# Patient Record
Sex: Female | Born: 1945
Health system: Southern US, Community
[De-identification: ages and names within clinical notes are randomized; demographics above are authoritative.]

## PROBLEM LIST (undated history)

## (undated) DIAGNOSIS — L409 Psoriasis, unspecified: Secondary | ICD-10-CM

## (undated) DIAGNOSIS — M791 Myalgia, unspecified site: Secondary | ICD-10-CM

## (undated) DIAGNOSIS — F32A Depression, unspecified: Secondary | ICD-10-CM

## (undated) DIAGNOSIS — I6529 Occlusion and stenosis of unspecified carotid artery: Secondary | ICD-10-CM

## (undated) DIAGNOSIS — N83201 Unspecified ovarian cyst, right side: Secondary | ICD-10-CM

## (undated) DIAGNOSIS — I348 Other nonrheumatic mitral valve disorders: Secondary | ICD-10-CM

## (undated) DIAGNOSIS — H5704 Mydriasis: Secondary | ICD-10-CM

## (undated) DIAGNOSIS — I341 Nonrheumatic mitral (valve) prolapse: Secondary | ICD-10-CM

## (undated) DIAGNOSIS — E559 Vitamin D deficiency, unspecified: Secondary | ICD-10-CM

## (undated) DIAGNOSIS — K219 Gastro-esophageal reflux disease without esophagitis: Secondary | ICD-10-CM

## (undated) DIAGNOSIS — M545 Low back pain, unspecified: Secondary | ICD-10-CM

## (undated) DIAGNOSIS — M1990 Unspecified osteoarthritis, unspecified site: Secondary | ICD-10-CM

## (undated) DIAGNOSIS — E161 Other hypoglycemia: Secondary | ICD-10-CM

## (undated) DIAGNOSIS — F329 Major depressive disorder, single episode, unspecified: Secondary | ICD-10-CM

## (undated) DIAGNOSIS — H8120 Vestibular neuronitis, unspecified ear: Secondary | ICD-10-CM

## (undated) DIAGNOSIS — R203 Hyperesthesia: Secondary | ICD-10-CM

## (undated) DIAGNOSIS — E7800 Pure hypercholesterolemia, unspecified: Secondary | ICD-10-CM

## (undated) DIAGNOSIS — M810 Age-related osteoporosis without current pathological fracture: Secondary | ICD-10-CM

## (undated) DIAGNOSIS — L920 Granuloma annulare: Secondary | ICD-10-CM

## (undated) DIAGNOSIS — Z87440 Personal history of urinary (tract) infections: Secondary | ICD-10-CM

## (undated) DIAGNOSIS — H8109 Meniere's disease, unspecified ear: Secondary | ICD-10-CM

## (undated) DIAGNOSIS — K5790 Diverticulosis of intestine, part unspecified, without perforation or abscess without bleeding: Secondary | ICD-10-CM

## (undated) DIAGNOSIS — M169 Osteoarthritis of hip, unspecified: Secondary | ICD-10-CM

## (undated) DIAGNOSIS — Z8489 Family history of other specified conditions: Secondary | ICD-10-CM

## (undated) DIAGNOSIS — I3489 Other nonrheumatic mitral valve disorders: Secondary | ICD-10-CM

## (undated) DIAGNOSIS — I340 Nonrheumatic mitral (valve) insufficiency: Secondary | ICD-10-CM

## (undated) DIAGNOSIS — L309 Dermatitis, unspecified: Secondary | ICD-10-CM

## (undated) DIAGNOSIS — T7840XA Allergy, unspecified, initial encounter: Secondary | ICD-10-CM

## (undated) DIAGNOSIS — B029 Zoster without complications: Secondary | ICD-10-CM

## (undated) DIAGNOSIS — H269 Unspecified cataract: Secondary | ICD-10-CM

## (undated) DIAGNOSIS — E785 Hyperlipidemia, unspecified: Secondary | ICD-10-CM

## (undated) DIAGNOSIS — H8190 Unspecified disorder of vestibular function, unspecified ear: Secondary | ICD-10-CM

## (undated) DIAGNOSIS — I511 Rupture of chordae tendineae, not elsewhere classified: Secondary | ICD-10-CM

## (undated) DIAGNOSIS — H832X9 Labyrinthine dysfunction, unspecified ear: Secondary | ICD-10-CM

## (undated) DIAGNOSIS — R140 Abdominal distension (gaseous): Secondary | ICD-10-CM

## (undated) DIAGNOSIS — R42 Dizziness and giddiness: Secondary | ICD-10-CM

## (undated) DIAGNOSIS — I2510 Atherosclerotic heart disease of native coronary artery without angina pectoris: Secondary | ICD-10-CM

## (undated) DIAGNOSIS — Z8719 Personal history of other diseases of the digestive system: Secondary | ICD-10-CM

## (undated) HISTORY — DX: Nonrheumatic mitral (valve) insufficiency: I34.0

## (undated) HISTORY — DX: Hyperlipidemia, unspecified: E78.5

## (undated) HISTORY — DX: Osteoarthritis of hip, unspecified: M16.9

## (undated) HISTORY — DX: Dizziness and giddiness: R42

## (undated) HISTORY — PX: TONSILLECTOMY: SUR1361

## (undated) HISTORY — DX: Rupture of chordae tendineae, not elsewhere classified: I51.1

## (undated) HISTORY — DX: Hyperesthesia: R20.3

## (undated) HISTORY — DX: Low back pain: M54.5

## (undated) HISTORY — DX: Unspecified cataract: H26.9

## (undated) HISTORY — DX: Other hypoglycemia: E16.1

## (undated) HISTORY — DX: Major depressive disorder, single episode, unspecified: F32.9

## (undated) HISTORY — DX: Nonrheumatic mitral (valve) prolapse: I34.1

## (undated) HISTORY — DX: Other nonrheumatic mitral valve disorders: I34.8

## (undated) HISTORY — DX: Allergy, unspecified, initial encounter: T78.40XA

## (undated) HISTORY — DX: Myalgia, unspecified site: M79.10

## (undated) HISTORY — DX: Depression, unspecified: F32.A

## (undated) HISTORY — DX: Abdominal distension (gaseous): R14.0

## (undated) HISTORY — DX: Psoriasis, unspecified: L40.9

## (undated) HISTORY — DX: Vitamin D deficiency, unspecified: E55.9

## (undated) HISTORY — DX: Zoster without complications: B02.9

## (undated) HISTORY — DX: Pure hypercholesterolemia, unspecified: E78.00

## (undated) HISTORY — DX: Gastro-esophageal reflux disease without esophagitis: K21.9

## (undated) HISTORY — DX: Diverticulosis of intestine, part unspecified, without perforation or abscess without bleeding: K57.90

## (undated) HISTORY — DX: Vestibular neuronitis, unspecified ear: H81.20

## (undated) HISTORY — DX: Granuloma annulare: L92.0

## (undated) HISTORY — DX: Low back pain, unspecified: M54.50

## (undated) HISTORY — DX: Unspecified ovarian cyst, right side: N83.201

## (undated) HISTORY — DX: Occlusion and stenosis of unspecified carotid artery: I65.29

## (undated) HISTORY — DX: Atherosclerotic heart disease of native coronary artery without angina pectoris: I25.10

## (undated) HISTORY — DX: Unspecified osteoarthritis, unspecified site: M19.90

## (undated) HISTORY — DX: Other nonrheumatic mitral valve disorders: I34.89

---

## 1998-04-02 ENCOUNTER — Other Ambulatory Visit: Admission: RE | Admit: 1998-04-02 | Discharge: 1998-04-02 | Payer: Self-pay | Admitting: Endocrinology

## 1998-04-23 ENCOUNTER — Ambulatory Visit (HOSPITAL_COMMUNITY): Admission: RE | Admit: 1998-04-23 | Discharge: 1998-04-23 | Payer: Self-pay | Admitting: Endocrinology

## 1998-12-20 ENCOUNTER — Encounter: Payer: Self-pay | Admitting: Endocrinology

## 1998-12-20 ENCOUNTER — Ambulatory Visit (HOSPITAL_COMMUNITY): Admission: RE | Admit: 1998-12-20 | Discharge: 1998-12-20 | Payer: Self-pay | Admitting: Endocrinology

## 1999-03-18 ENCOUNTER — Other Ambulatory Visit: Admission: RE | Admit: 1999-03-18 | Discharge: 1999-03-18 | Payer: Self-pay | Admitting: Endocrinology

## 2000-03-19 ENCOUNTER — Encounter: Payer: Self-pay | Admitting: Family Medicine

## 2000-03-19 ENCOUNTER — Ambulatory Visit (HOSPITAL_COMMUNITY): Admission: RE | Admit: 2000-03-19 | Discharge: 2000-03-19 | Payer: Self-pay | Admitting: Family Medicine

## 2001-06-17 ENCOUNTER — Other Ambulatory Visit: Admission: RE | Admit: 2001-06-17 | Discharge: 2001-06-17 | Payer: Self-pay | Admitting: Family Medicine

## 2001-07-19 ENCOUNTER — Ambulatory Visit (HOSPITAL_COMMUNITY): Admission: RE | Admit: 2001-07-19 | Discharge: 2001-07-19 | Payer: Self-pay | Admitting: Family Medicine

## 2001-07-19 ENCOUNTER — Encounter: Payer: Self-pay | Admitting: Family Medicine

## 2003-04-26 ENCOUNTER — Encounter: Admission: RE | Admit: 2003-04-26 | Discharge: 2003-06-15 | Payer: Self-pay | Admitting: Family Medicine

## 2003-05-12 ENCOUNTER — Emergency Department (HOSPITAL_COMMUNITY): Admission: EM | Admit: 2003-05-12 | Discharge: 2003-05-12 | Payer: Self-pay | Admitting: Emergency Medicine

## 2003-06-06 ENCOUNTER — Ambulatory Visit (HOSPITAL_COMMUNITY): Admission: RE | Admit: 2003-06-06 | Discharge: 2003-06-06 | Payer: Self-pay | Admitting: Orthopedic Surgery

## 2003-06-06 ENCOUNTER — Encounter: Payer: Self-pay | Admitting: Orthopedic Surgery

## 2005-04-03 ENCOUNTER — Other Ambulatory Visit: Admission: RE | Admit: 2005-04-03 | Discharge: 2005-04-03 | Payer: Self-pay | Admitting: Family Medicine

## 2005-05-22 ENCOUNTER — Encounter: Admission: RE | Admit: 2005-05-22 | Discharge: 2005-05-22 | Payer: Self-pay | Admitting: Family Medicine

## 2008-08-02 ENCOUNTER — Other Ambulatory Visit: Admission: RE | Admit: 2008-08-02 | Discharge: 2008-08-02 | Payer: Self-pay | Admitting: Obstetrics and Gynecology

## 2008-10-24 ENCOUNTER — Ambulatory Visit (HOSPITAL_COMMUNITY): Admission: RE | Admit: 2008-10-24 | Discharge: 2008-10-24 | Payer: Self-pay | Admitting: Obstetrics and Gynecology

## 2010-08-13 ENCOUNTER — Encounter: Admission: RE | Admit: 2010-08-13 | Discharge: 2010-08-13 | Payer: Self-pay | Admitting: Family Medicine

## 2010-12-25 ENCOUNTER — Other Ambulatory Visit: Payer: Self-pay | Admitting: Nurse Practitioner

## 2010-12-25 ENCOUNTER — Other Ambulatory Visit
Admission: RE | Admit: 2010-12-25 | Discharge: 2010-12-25 | Payer: Self-pay | Source: Home / Self Care | Admitting: Obstetrics and Gynecology

## 2011-09-09 ENCOUNTER — Other Ambulatory Visit: Payer: Self-pay | Admitting: Family Medicine

## 2011-09-09 DIAGNOSIS — R0989 Other specified symptoms and signs involving the circulatory and respiratory systems: Secondary | ICD-10-CM

## 2011-09-10 ENCOUNTER — Ambulatory Visit
Admission: RE | Admit: 2011-09-10 | Discharge: 2011-09-10 | Disposition: A | Discharge status: Home / Self Care | Payer: 59 | Source: Ambulatory Visit | Attending: Family Medicine | Admitting: Family Medicine

## 2011-09-10 DIAGNOSIS — R0989 Other specified symptoms and signs involving the circulatory and respiratory systems: Secondary | ICD-10-CM

## 2011-09-16 ENCOUNTER — Other Ambulatory Visit: Payer: Self-pay

## 2011-09-16 DIAGNOSIS — I6529 Occlusion and stenosis of unspecified carotid artery: Secondary | ICD-10-CM

## 2011-10-14 ENCOUNTER — Encounter: Payer: Self-pay | Admitting: Surgery

## 2011-10-16 ENCOUNTER — Encounter: Payer: Self-pay | Admitting: Surgery

## 2011-10-19 ENCOUNTER — Ambulatory Visit (INDEPENDENT_AMBULATORY_CARE_PROVIDER_SITE_OTHER): Payer: 59 | Admitting: Surgery

## 2011-10-19 ENCOUNTER — Ambulatory Visit (INDEPENDENT_AMBULATORY_CARE_PROVIDER_SITE_OTHER): Payer: 59 | Admitting: Vascular Surgery

## 2011-10-19 ENCOUNTER — Encounter: Payer: Self-pay | Admitting: Surgery

## 2011-10-19 DIAGNOSIS — I6529 Occlusion and stenosis of unspecified carotid artery: Secondary | ICD-10-CM

## 2011-10-19 DIAGNOSIS — R42 Dizziness and giddiness: Secondary | ICD-10-CM

## 2011-10-19 DIAGNOSIS — H9319 Tinnitus, unspecified ear: Secondary | ICD-10-CM

## 2011-10-19 NOTE — Progress Notes (Signed)
[facility] [place]   Patient name: Lori Frost MRN: [id number] DOB: [birth year] Sex: female   Referred by: Dr. Juluis Rainier  Reason for referral:  Chief Complaint  Patient presents with  . Carotid    dizziness after vertigo attacks for last year. The dizziness occurs every day    HISTORY OF PRESENT ILLNESS: The patient comes in today for evaluation of her carotid occlusive disease. She has recently undergone carotid duplex study during a workup for her dizziness. This found less than 50% right carotid stenosis. The patient's difficulties date back to approximately one year ago when she had a vertigo-like episode she's undergone multiple tests and referrals to evaluate this problem. She has been diagnosed with  possible vestibular neuritis. She continues to suffers from balance issues and dizziness. She's had multiple attacks I was in the past 12 months.  The patient denies any specific symptoms related to carotid occlusive disease. Specifically she denies numbness or weakness in either extremity. She denies slurred speech she denies amaurosis fugax  The patient does not have a history of smoking. She does have hypercholesterolemia. She is intolerant of statin medications and is maintained on a maximum dose of WelChol. She also suffers from mitral valve prolapse and migraine headaches. She has a strong family history for premature cardiovascular disease. Past Medical History  Diagnosis Date  . Hyperlipidemia   . Depression   . Myalgia   . DJD (degenerative joint disease)   . Shingles   . Diverticulosis   . Osteopenia   . Vertigo   . Migraine   . Osteopenia   . Cyst of ovary, right   . Vestibular neuronitis     No past surgical history on file.  History   Social History  . Marital Status: Single    Spouse Name: N/A    Number of Children: N/A  . Years of Education: N/A   Occupational History  . Not on file.   Social History Main Topics  .  Smoking status: Never Smoker   . Smokeless tobacco: Not on file  . Alcohol Use: No  . Drug Use: No  . Sexually Active: Not on file   Other Topics Concern  . Not on file   Social History Narrative  . No narrative on file    No family history on file.  Allergies as of 10/19/2011 - Review Complete 10/19/2011  Allergen Reaction Noted  . Codeine Nausea Only 10/14/2011  . Statins  10/14/2011  . Zoloft  10/14/2011    Current Outpatient Prescriptions on File Prior to Visit  Medication Sig Dispense Refill  . Ascorbic Acid (VITAMIN C) 1000 MG tablet Take 1,000 mg by mouth daily.        Marland Kitchen aspirin EC 81 MG tablet Take 81 mg by mouth daily.        . butalbital-acetaminophen-caffeine (FIORICET WITH CODEINE) 50-325-40-30 MG per capsule Take 1 capsule by mouth every 4 (four) hours as needed.        . calcium carbonate (OS-CAL) 600 MG TABS Take 600 mg by mouth daily.        . Cholecalciferol (VITAMIN D3) 1000 UNITS CAPS Take 1 tablet by mouth daily.        . colesevelam (WELCHOL) 625 MG tablet Take 1,875 mg by mouth 2 (two) times daily with a meal.        . cromolyn (NASALCROM) 5.2 MG/ACT nasal spray Place 1 spray into the nose daily.        Marland Kitchen  Glucosamine-Chondroit-Vit C-Mn (GLUCOSAMINE 1500 COMPLEX PO) Take 1 tablet by mouth daily.        Marland Kitchen loratadine (CLARITIN) 10 MG tablet Take 10 mg by mouth daily.        Marland Kitchen LORazepam (ATIVAN) 0.5 MG tablet Take 0.5 mg by mouth every 6 (six) hours as needed.        . meclizine (ANTIVERT) 25 MG tablet Take 25 mg by mouth 2 (two) times daily as needed.        . Multiple Vitamins-Minerals (CENTRUM CARDIO) TABS Take 1 tablet by mouth 2 (two) times daily.        . Omega-3 Fatty Acids (FISH OIL) 1200 MG CAPS Take 1 capsule by mouth daily.           REVIEW OF SYSTEMS: Cardiovascular: Positive for palpitations and a murmur from prolapse Pulmonary: Positive for bronchitis Neurologic: Positive for dizziness and headaches Hematologic: No bleeding problems or  clotting disorders. Musculoskeletal: No joint pain or joint swelling. Positive for muscle pain Gastrointestinal: No blood in stool or hematemesis Genitourinary: Positive for burning with urination and frequent urination occasional UTI Psychiatric:: No history of major depression. Integumentary: Rash on her arms and legs Constitutional: No fever or chills.  PHYSICAL EXAMINATION: General: The patient appears their stated age.  Vital signs are BP 125/80  Pulse 90  Temp(Src) 98 F (36.7 C) (Oral)  Resp 20  Ht 5\' 2"  (1.575 m)  Wt 125 lb (56.7 kg)  BMI 22.86 kg/m2  SpO2 97% Pulmonary: Respirations are non-labored no wheezes Abdomen: Soft and non-tender aorta is nonaneurysmal Musculoskeletal: There are no major deformities.   Neurologic: No focal weakness or paresthesias are detected, Skin: There are no ulcer or rashes noted. Psychiatric: The patient has normal affect. Cardiovascular: There is a regular rate and rhythm without significant murmur appreciated. Pedal pulses are palpable no carotid bruits  Diagnostic Studies: Carotid duplex shows a 40-59% right carotid stenosis left carotid artery is widely patent both vertebral arteries are antegrade   Medication Changes: None  Assessment:  Asymptomatic carotid stenosis Plan: I do not believe that the patient's carotid disease can be treated to any of her active symptoms. I believe she is completely asymptomatic from her carotid stenosis. We did discuss the signs and symptoms of a stroke today so that she can be on the look out if they should occur in no if they are. I believe she should be medically managed at this time which would include antiplatelet therapy (continue her aspirin), blood pressure control and cholesterol management. The patient will require yearly surveillance ultrasound screening of her carotid artery I have scheduled her To be done our office in one year out on seeing her on a every other year basis assuming that she  has not suffered change in symptoms or progression of disease     V. Charlena Cross, M.D. [facility] [place] Office: [phone]

## 2011-10-26 NOTE — Procedures (Unsigned)
CAROTID DUPLEX EXAM  INDICATION:  Follow up carotid stenosis.  HISTORY: Diabetes:  No. Cardiac:  No, mitral valve prolapse. Hypertension:  No. Smoking:  No. Previous Surgery:  No carotid intervention. CV History:  Ringing in ears, vertigo. Amaurosis Fugax No, Paresthesias No, Hemiparesis No.                                      RIGHT             LEFT Brachial systolic pressure:         122               118 Brachial Doppler waveforms:         WNL               WNL Vertebral direction of flow:        Antegrade         Antegrade DUPLEX VELOCITIES (cm/sec) CCA peak systolic                   87                81 ECA peak systolic                   84                79 ICA peak systolic                   88 - P/162 - D    69 ICA end diastolic                   34 - P/59 - D     28 PLAQUE MORPHOLOGY:                  Heterogenous      Heterogenous PLAQUE AMOUNT:                      Mild              Mild PLAQUE LOCATION:                    CCA/ICA           CCA/ICA  IMPRESSION: 1. Right internal carotid artery stenosis present in the 1% to 39%     range with elevated velocities at the distal segment and vessel     tortuosity present, maybe suggesting a 40% to 59% stenosis. 2. Left internal carotid artery stenosis present in the 1% to 39%     range. 3. Patent bilateral external carotid arteries. 4. Patent and antegrade bilateral vertebral arteries.  ___________________________________________ V. Charlena Cross, MD  SH/MEDQ  D:  10/19/2011  T:  10/19/2011  Job:  [id number]

## 2012-10-19 ENCOUNTER — Other Ambulatory Visit: Payer: Self-pay | Admitting: *Deleted

## 2012-10-19 DIAGNOSIS — I6529 Occlusion and stenosis of unspecified carotid artery: Secondary | ICD-10-CM

## 2012-10-24 ENCOUNTER — Ambulatory Visit (INDEPENDENT_AMBULATORY_CARE_PROVIDER_SITE_OTHER): Payer: 59 | Admitting: Vascular Surgery

## 2012-10-24 DIAGNOSIS — I6529 Occlusion and stenosis of unspecified carotid artery: Secondary | ICD-10-CM

## 2012-10-24 NOTE — Progress Notes (Signed)
Carotid duplex performed @ [facility] 10/24/2012

## 2012-10-25 ENCOUNTER — Other Ambulatory Visit: Payer: Self-pay | Admitting: *Deleted

## 2012-10-25 DIAGNOSIS — I6529 Occlusion and stenosis of unspecified carotid artery: Secondary | ICD-10-CM

## 2012-10-25 DIAGNOSIS — I773 Arterial fibromuscular dysplasia: Secondary | ICD-10-CM

## 2012-10-27 ENCOUNTER — Encounter: Payer: Self-pay | Admitting: Surgery

## 2013-05-01 ENCOUNTER — Other Ambulatory Visit: Payer: 59

## 2013-05-01 ENCOUNTER — Ambulatory Visit: Payer: 59 | Admitting: Neurosurgery

## 2013-10-16 ENCOUNTER — Other Ambulatory Visit: Payer: 59

## 2013-10-16 ENCOUNTER — Ambulatory Visit: Payer: 59 | Admitting: Neurosurgery

## 2013-10-23 ENCOUNTER — Ambulatory Visit: Payer: 59 | Admitting: Neurosurgery

## 2013-10-23 ENCOUNTER — Other Ambulatory Visit (HOSPITAL_COMMUNITY): Payer: 59

## 2013-10-23 ENCOUNTER — Other Ambulatory Visit: Payer: 59

## 2013-10-23 ENCOUNTER — Ambulatory Visit: Payer: 59 | Admitting: Family

## 2013-10-26 ENCOUNTER — Encounter: Payer: Self-pay | Admitting: Family

## 2013-10-27 ENCOUNTER — Ambulatory Visit: Payer: 59 | Admitting: Family

## 2013-10-27 ENCOUNTER — Ambulatory Visit (HOSPITAL_COMMUNITY)
Admission: RE | Admit: 2013-10-27 | Discharge: 2013-10-27 | Disposition: A | Discharge status: Home / Self Care | Payer: 59 | Source: Ambulatory Visit | Attending: Family | Admitting: Family

## 2013-10-27 DIAGNOSIS — I773 Arterial fibromuscular dysplasia: Secondary | ICD-10-CM

## 2013-10-27 DIAGNOSIS — I7789 Other specified disorders of arteries and arterioles: Secondary | ICD-10-CM

## 2013-10-27 DIAGNOSIS — I6529 Occlusion and stenosis of unspecified carotid artery: Secondary | ICD-10-CM | POA: Insufficient documentation

## 2013-11-01 ENCOUNTER — Other Ambulatory Visit: Payer: Self-pay | Admitting: Surgery

## 2013-11-03 ENCOUNTER — Encounter: Payer: Self-pay | Admitting: Vascular Surgery

## 2014-03-26 ENCOUNTER — Encounter: Payer: Self-pay | Admitting: *Deleted

## 2014-08-16 ENCOUNTER — Other Ambulatory Visit: Payer: Self-pay | Admitting: Family Medicine

## 2014-08-16 ENCOUNTER — Other Ambulatory Visit (HOSPITAL_COMMUNITY)
Admission: RE | Admit: 2014-08-16 | Discharge: 2014-08-16 | Disposition: A | Discharge status: Home / Self Care | Payer: 59 | Source: Ambulatory Visit | Attending: Family Medicine | Admitting: Family Medicine

## 2014-08-16 DIAGNOSIS — Z124 Encounter for screening for malignant neoplasm of cervix: Secondary | ICD-10-CM | POA: Insufficient documentation

## 2014-08-21 LAB — CYTOLOGY - PAP

## 2014-10-25 ENCOUNTER — Encounter: Payer: Self-pay | Admitting: Family

## 2014-10-26 ENCOUNTER — Encounter: Payer: Self-pay | Admitting: Family

## 2014-10-26 ENCOUNTER — Ambulatory Visit (INDEPENDENT_AMBULATORY_CARE_PROVIDER_SITE_OTHER): Payer: 59 | Admitting: Family

## 2014-10-26 ENCOUNTER — Ambulatory Visit (HOSPITAL_COMMUNITY)
Admission: RE | Admit: 2014-10-26 | Discharge: 2014-10-26 | Disposition: A | Discharge status: Home / Self Care | Payer: 59 | Source: Ambulatory Visit | Attending: Family | Admitting: Family

## 2014-10-26 VITALS — BP 138/85 | HR 72 | Resp 16 | Ht 61.5 in | Wt 118.0 lb

## 2014-10-26 DIAGNOSIS — I6523 Occlusion and stenosis of bilateral carotid arteries: Secondary | ICD-10-CM | POA: Diagnosis not present

## 2014-10-26 DIAGNOSIS — I6529 Occlusion and stenosis of unspecified carotid artery: Secondary | ICD-10-CM

## 2014-10-26 DIAGNOSIS — Z9889 Other specified postprocedural states: Secondary | ICD-10-CM

## 2014-10-26 DIAGNOSIS — Z48812 Encounter for surgical aftercare following surgery on the circulatory system: Secondary | ICD-10-CM

## 2014-10-26 NOTE — Progress Notes (Signed)
Established Carotid Patient   History of Present Illness  Lori Frost is a 68 y.o. female patient of Dr. Myra GianottiBrabham who comes in today for evaluation of her carotid occlusive disease. She has recently undergone carotid duplex study during a workup for her dizziness. This found less than 50% right carotid stenosis. The patient's difficulties date back to approximately 2011 when she had a vertigo-like episode; she's undergone multiple tests and referrals to evaluate this problem. She has been diagnosed with possible vestibular neuritis, vestibular migraine, these conditions are [facility]. She continues to suffers from balance issues and dizziness.   The patient denies any specific symptoms related to carotid occlusive disease. Specifically she denies numbness or weakness in either extremity. She denies slurred speech she denies amaurosis fugax  The patient does not have a history of smoking. She does have hypercholesterolemia. She is intolerant of statin medications and is maintained on a maximum dose of WelChol. She also suffers from mitral valve prolapse. She has a strong family history for premature cardiovascular disease.  Patient has not had previous carotid artery intervention.  The patient reports New Medical or Surgical History: she was diagnosed with osteoporosis and was started on Fosamax. She also has osteoarthritis and sees Dr. Merlyn AlbertAlucio and Dr. Ethelene Halamos for ESI's and hip joint injections.  Pt Diabetic: No Pt smoker: non-smoker  Pt meds include: Statin : No: statins caused myalgias and arthralgias ASA: Yes Other anticoagulants/antiplatelets: no   Past Medical History  Diagnosis Date  . Hyperlipidemia   . Depression   . Myalgia   . DJD (degenerative joint disease)   . Shingles   . Diverticulosis   . Osteopenia   . Vertigo   . Migraine   . Cyst of ovary, right   . Vestibular neuronitis   . Mitral valve regurgitation   . MVP (mitral valve prolapse)      Social History History  Substance Use Topics  . Smoking status: Never Smoker   . Smokeless tobacco: Never Used  . Alcohol Use: No    Family History No family history on file.  Surgical History No past surgical history on file.  Allergies  Allergen Reactions  . Codeine Nausea Only  . Sertraline Hcl   . Statins     Current Outpatient Prescriptions  Medication Sig Dispense Refill  . alendronate (FOSAMAX) 70 MG tablet Take 70 mg by mouth once a week. Take with a full glass of water on an empty stomach.    . Ascorbic Acid (VITAMIN C) 1000 MG tablet Take 1,000 mg by mouth daily.      Marland Kitchen. aspirin EC 81 MG tablet Take 81 mg by mouth daily.      . calcium carbonate (OS-CAL) 600 MG TABS Take 600 mg by mouth daily.      . Cholecalciferol (VITAMIN D3) 1000 UNITS CAPS Take 1 tablet by mouth daily.      . colesevelam (WELCHOL) 625 MG tablet Take 1,875 mg by mouth 2 (two) times daily with a meal.      . cromolyn (NASALCROM) 5.2 MG/ACT nasal spray Place 1 spray into the nose daily.      . Glucosamine-Chondroit-Vit C-Mn (GLUCOSAMINE 1500 COMPLEX PO) Take 1 tablet by mouth daily.      Marland Kitchen. loratadine (CLARITIN) 10 MG tablet Take 10 mg by mouth daily.      . Omega-3 Fatty Acids (FISH OIL) 1200 MG CAPS Take 1 capsule by mouth daily.      . butalbital-acetaminophen-caffeine (FIORICET  WITH CODEINE) 50-325-40-30 MG per capsule Take 1 capsule by mouth every 4 (four) hours as needed.      Marland Kitchen. LORazepam (ATIVAN) 0.5 MG tablet Take 0.5 mg by mouth every 6 (six) hours as needed.      . meclizine (ANTIVERT) 25 MG tablet Take 25 mg by mouth 2 (two) times daily as needed.      . Multiple Vitamins-Minerals (CENTRUM CARDIO) TABS Take 1 tablet by mouth 2 (two) times daily.       No current facility-administered medications for this visit.    Review of Systems : See HPI for pertinent positives and negatives.  Physical Examination  Filed Vitals:   10/26/14 1531 10/26/14 1534  BP: 132/74 138/85  Pulse: 76  72  Resp: 16   Height: 5' 1.5" (1.562 m)   Weight: 118 lb (53.524 kg)    Body mass index is 21.94 kg/(m^2).  General: WDWN female in NAD GAIT: normal Eyes: PERRLA Pulmonary:  Non-labored, CTAB, Negative  Rales, Negative rhonchi, & Negative wheezing.  Cardiac: regular Rhythm,  positive murmur.  VASCULAR EXAM Carotid Bruits Right Left   Transmitted cardiac murmur Transmitted cardiac murmur  Aorta is not palpable Radial pulses are 2+ palpable and equal.                                                                                                                            LE Pulses Right Left       POPLITEAL  not palpable   not palpable       POSTERIOR TIBIAL   palpable    palpable        DORSALIS PEDIS      ANTERIOR TIBIAL  palpable   palpable     Gastrointestinal: soft, nontender, BS WNL, no r/g,  negative palpated masses.  Musculoskeletal: Negative muscle atrophy/wasting. M/S 5/5 throughout except 4/5 in right LE, Extremities without ischemic changes.  Neurologic: A&O X 3; Appropriate Affect ; SENSATION ;normal;  Speech is normal CN 2-12 intact, Pain and light touch intact in extremities, Motor exam as listed above.   Non-Invasive Vascular Imaging CAROTID DUPLEX 10/26/2014   CEREBROVASCULAR DUPLEX EVALUATION    INDICATION: Carotid disease    PREVIOUS INTERVENTION(S):     DUPLEX EXAM:     RIGHT  LEFT  Peak Systolic Velocities (cm/s) End Diastolic Velocities (cm/s) Plaque LOCATION Peak Systolic Velocities (cm/s) End Diastolic Velocities (cm/s) Plaque  82 21  CCA PROXIMAL 83 23   83 24  CCA MID 71 21   76 24  CCA DISTAL 71 25   100 13  ECA 69 13   100 34 HT ICA PROXIMAL 75 30   130 49  ICA MID 115 44   65 24  ICA DISTAL 67 26     1.7 ICA / CCA Ratio (PSV) 1.6  Antegrade Vertebral Flow Antegrade   Brachial Systolic Pressure (mmHg)   Multiphasic (subclavian artery) Brachial Artery Waveforms Multiphasic (subclavian artery)  Plaque Morphology:  HM =  Homogeneous, HT = Heterogeneous, CP = Calcific Plaque, SP = Smooth Plaque, IP = Irregular Plaque     ADDITIONAL FINDINGS: . No significant stenosis of the bilateral external or common carotid arteries. . Mild increase in the bilateral mid internal carotid arteries which appears to be due to mild vessel tortuosity.    IMPRESSION: Doppler velocities suggest a less than 40% stenosis of the right proximal internal carotid artery and no left internal carotid artery stenosis.    Compared to the previous exam:  No significant change noted when compared to the previous exam on 10/27/13.        Assessment: Lori Frost is a 68 y.o. female who presents with asymptomatic less than 40% stenosis of the right proximal internal carotid artery and no left internal carotid artery stenosis. No significant change noted when compared to the previous exam on 10/27/13.    Plan: Follow-up in 1 year with Carotid Duplex.   I discussed in depth with the patient the nature of atherosclerosis, and emphasized the importance of maximal medical management including strict control of blood pressure, blood glucose, and lipid levels, obtaining regular exercise, and continued cessation of smoking.  The patient is aware that without maximal medical management the underlying atherosclerotic disease process will progress, limiting the benefit of any interventions. The patient was given information about stroke prevention and what symptoms should prompt the patient to seek immediate medical care. Thank you for allowing [place] to participate in this patient's care.  Charisse March, RN, MSN, FNP-C [facility] [place] Office: [phone]  Clinic Physician: Imogene Burn  10/26/2014 3:46 PM

## 2014-10-26 NOTE — Patient Instructions (Signed)
Stroke Prevention Some medical conditions and behaviors are associated with an increased chance of having a stroke. You may prevent a stroke by making healthy choices and managing medical conditions. HOW CAN I REDUCE MY RISK OF HAVING A STROKE?   Stay physically active. Get at least 30 minutes of activity on most or all days.  Do not smoke. It may also be helpful to avoid exposure to secondhand smoke.  Limit alcohol use. Moderate alcohol use is considered to be:  No more than 2 drinks per day for men.  No more than 1 drink per day for nonpregnant women.  Eat healthy foods. This involves:  Eating 5 or more servings of fruits and vegetables a day.  Making dietary changes that address high blood pressure (hypertension), high cholesterol, diabetes, or obesity.  Manage your cholesterol levels.  Making food choices that are high in fiber and low in saturated fat, trans fat, and cholesterol may control cholesterol levels.  Take any prescribed medicines to control cholesterol as directed by your health care provider.  Manage your diabetes.  Controlling your carbohydrate and sugar intake is recommended to manage diabetes.  Take any prescribed medicines to control diabetes as directed by your health care provider.  Control your hypertension.  Making food choices that are low in salt (sodium), saturated fat, trans fat, and cholesterol is recommended to manage hypertension.  Take any prescribed medicines to control hypertension as directed by your health care provider.  Maintain a healthy weight.  Reducing calorie intake and making food choices that are low in sodium, saturated fat, trans fat, and cholesterol are recommended to manage weight.  Stop drug abuse.  Avoid taking birth control pills.  Talk to your health care provider about the risks of taking birth control pills if you are over 35 years old, smoke, get migraines, or have ever had a blood clot.  Get evaluated for sleep  disorders (sleep apnea).  Talk to your health care provider about getting a sleep evaluation if you snore a lot or have excessive sleepiness.  Take medicines only as directed by your health care provider.  For some people, aspirin or blood thinners (anticoagulants) are helpful in reducing the risk of forming abnormal blood clots that can lead to stroke. If you have the irregular heart rhythm of atrial fibrillation, you should be on a blood thinner unless there is a good reason you cannot take them.  Understand all your medicine instructions.  Make sure that other conditions (such as anemia or atherosclerosis) are addressed. SEEK IMMEDIATE MEDICAL CARE IF:   You have sudden weakness or numbness of the face, arm, or leg, especially on one side of the body.  Your face or eyelid droops to one side.  You have sudden confusion.  You have trouble speaking (aphasia) or understanding.  You have sudden trouble seeing in one or both eyes.  You have sudden trouble walking.  You have dizziness.  You have a loss of balance or coordination.  You have a sudden, severe headache with no known cause.  You have new chest pain or an irregular heartbeat. Any of these symptoms may represent a serious problem that is an emergency. Do not wait to see if the symptoms will go away. Get medical help at once. Call your local emergency services (911 in [place] Do not drive yourself to the hospital. Document Released: 01/07/2005 Document Revised: 04/16/2014 Document Reviewed: 06/02/2013 ExitCare Patient Information 2015 [employer]. This information is not intended to replace advice given   to you by your health care provider. Make sure you discuss any questions you have with your health care provider.  

## 2014-10-29 ENCOUNTER — Ambulatory Visit: Payer: 59 | Admitting: Family

## 2014-10-29 ENCOUNTER — Other Ambulatory Visit (HOSPITAL_COMMUNITY): Payer: 59

## 2014-10-29 NOTE — Addendum Note (Signed)
Addended by: Sharee PimpleMCCHESNEY, Merdith Adan K on: 10/29/2014 10:23 AM   Modules accepted: Orders

## 2015-01-29 ENCOUNTER — Other Ambulatory Visit: Payer: Self-pay | Admitting: Gastroenterology

## 2015-01-29 DIAGNOSIS — R140 Abdominal distension (gaseous): Secondary | ICD-10-CM

## 2015-02-05 HISTORY — PX: EYE SURGERY: SHX253

## 2015-02-08 ENCOUNTER — Ambulatory Visit
Admission: RE | Admit: 2015-02-08 | Discharge: 2015-02-08 | Disposition: A | Discharge status: Home / Self Care | Payer: Self-pay | Source: Ambulatory Visit | Attending: Gastroenterology | Admitting: Gastroenterology

## 2015-02-08 DIAGNOSIS — R140 Abdominal distension (gaseous): Secondary | ICD-10-CM

## 2015-02-11 ENCOUNTER — Other Ambulatory Visit (HOSPITAL_COMMUNITY): Payer: Self-pay | Admitting: Gastroenterology

## 2015-02-11 DIAGNOSIS — R109 Unspecified abdominal pain: Secondary | ICD-10-CM

## 2015-02-21 HISTORY — PX: EYE SURGERY: SHX253

## 2015-03-01 ENCOUNTER — Ambulatory Visit (HOSPITAL_COMMUNITY)
Admission: RE | Admit: 2015-03-01 | Discharge: 2015-03-01 | Disposition: A | Discharge status: Home / Self Care | Payer: 59 | Source: Ambulatory Visit | Attending: Gastroenterology | Admitting: Gastroenterology

## 2015-03-01 DIAGNOSIS — R1011 Right upper quadrant pain: Secondary | ICD-10-CM | POA: Diagnosis not present

## 2015-03-01 DIAGNOSIS — R109 Unspecified abdominal pain: Secondary | ICD-10-CM

## 2015-03-01 MED ORDER — TECHNETIUM TC 99M MEBROFENIN IV KIT
5.4000 | PACK | Freq: Once | INTRAVENOUS | Status: AC | PRN
  Administered 2015-03-01: 5 via INTRAVENOUS

## 2015-05-30 ENCOUNTER — Other Ambulatory Visit: Payer: Self-pay | Admitting: Gastroenterology

## 2015-05-30 DIAGNOSIS — R1310 Dysphagia, unspecified: Secondary | ICD-10-CM

## 2015-05-31 ENCOUNTER — Ambulatory Visit
Admission: RE | Admit: 2015-05-31 | Discharge: 2015-05-31 | Disposition: A | Discharge status: Home / Self Care | Payer: PPO | Source: Ambulatory Visit | Attending: Gastroenterology | Admitting: Gastroenterology

## 2015-05-31 DIAGNOSIS — R1310 Dysphagia, unspecified: Secondary | ICD-10-CM

## 2015-10-29 ENCOUNTER — Encounter: Payer: Self-pay | Admitting: Family

## 2015-11-01 ENCOUNTER — Encounter: Payer: Self-pay | Admitting: Family

## 2015-11-01 ENCOUNTER — Ambulatory Visit (INDEPENDENT_AMBULATORY_CARE_PROVIDER_SITE_OTHER): Payer: PPO | Admitting: Family

## 2015-11-01 ENCOUNTER — Ambulatory Visit (HOSPITAL_COMMUNITY)
Admission: RE | Admit: 2015-11-01 | Discharge: 2015-11-01 | Disposition: A | Discharge status: Home / Self Care | Payer: PPO | Source: Ambulatory Visit | Attending: Family | Admitting: Family

## 2015-11-01 VITALS — BP 155/86 | HR 70 | Temp 97.3°F | Resp 16 | Ht 62.5 in | Wt 122.0 lb

## 2015-11-01 DIAGNOSIS — I6521 Occlusion and stenosis of right carotid artery: Secondary | ICD-10-CM | POA: Diagnosis not present

## 2015-11-01 DIAGNOSIS — Z48812 Encounter for surgical aftercare following surgery on the circulatory system: Secondary | ICD-10-CM | POA: Diagnosis present

## 2015-11-01 NOTE — Patient Instructions (Signed)
Stroke Prevention Some medical conditions and behaviors are associated with an increased chance of having a stroke. You may prevent a stroke by making healthy choices and managing medical conditions. HOW CAN I REDUCE MY RISK OF HAVING A STROKE?   Stay physically active. Get at least 30 minutes of activity on most or all days.  Do not smoke. It may also be helpful to avoid exposure to secondhand smoke.  Limit alcohol use. Moderate alcohol use is considered to be:  No more than 2 drinks per day for men.  No more than 1 drink per day for nonpregnant women.  Eat healthy foods. This involves:  Eating 5 or more servings of fruits and vegetables a day.  Making dietary changes that address high blood pressure (hypertension), high cholesterol, diabetes, or obesity.  Manage your cholesterol levels.  Making food choices that are high in fiber and low in saturated fat, trans fat, and cholesterol may control cholesterol levels.  Take any prescribed medicines to control cholesterol as directed by your health care provider.  Manage your diabetes.  Controlling your carbohydrate and sugar intake is recommended to manage diabetes.  Take any prescribed medicines to control diabetes as directed by your health care provider.  Control your hypertension.  Making food choices that are low in salt (sodium), saturated fat, trans fat, and cholesterol is recommended to manage hypertension.  Ask your health care provider if you need treatment to lower your blood pressure. Take any prescribed medicines to control hypertension as directed by your health care provider.  If you are 18-39 years of age, have your blood pressure checked every 3-5 years. If you are 40 years of age or older, have your blood pressure checked every year.  Maintain a healthy weight.  Reducing calorie intake and making food choices that are low in sodium, saturated fat, trans fat, and cholesterol are recommended to manage  weight.  Stop drug abuse.  Avoid taking birth control pills.  Talk to your health care provider about the risks of taking birth control pills if you are over 35 years old, smoke, get migraines, or have ever had a blood clot.  Get evaluated for sleep disorders (sleep apnea).  Talk to your health care provider about getting a sleep evaluation if you snore a lot or have excessive sleepiness.  Take medicines only as directed by your health care provider.  For some people, aspirin or blood thinners (anticoagulants) are helpful in reducing the risk of forming abnormal blood clots that can lead to stroke. If you have the irregular heart rhythm of atrial fibrillation, you should be on a blood thinner unless there is a good reason you cannot take them.  Understand all your medicine instructions.  Make sure that other conditions (such as anemia or atherosclerosis) are addressed. SEEK IMMEDIATE MEDICAL CARE IF:   You have sudden weakness or numbness of the face, arm, or leg, especially on one side of the body.  Your face or eyelid droops to one side.  You have sudden confusion.  You have trouble speaking (aphasia) or understanding.  You have sudden trouble seeing in one or both eyes.  You have sudden trouble walking.  You have dizziness.  You have a loss of balance or coordination.  You have a sudden, severe headache with no known cause.  You have new chest pain or an irregular heartbeat. Any of these symptoms may represent a serious problem that is an emergency. Do not wait to see if the symptoms will   go away. Get medical help at once. Call your local emergency services (911 in [place] Do not drive yourself to the hospital.   This information is not intended to replace advice given to you by your health care provider. Make sure you discuss any questions you have with your health care provider.   Document Released: 01/07/2005 Document Revised: 12/21/2014 Document Reviewed:  06/02/2013 Elsevier Interactive Patient Education 2016 [employer].  

## 2015-11-01 NOTE — Progress Notes (Signed)
Filed Vitals:   11/01/15 1033 11/01/15 1037 11/01/15 1053  BP: 141/84 137/82 155/86  Pulse: 75 69 70  Temp:  97.3 F (36.3 C)   TempSrc:  Oral   Resp:  16   Height:  5' 2.5" (1.588 m)   Weight:  122 lb (55.339 kg)   SpO2:  94%

## 2015-11-01 NOTE — Progress Notes (Signed)
Established Carotid Patient   History of Present Illness  Lori Frost is a 69 y.o. female patient of Dr. Myra Gianotti who comes in today for evaluation of her carotid occlusive disease. She had undergone carotid duplex study during a workup for her dizziness. This found less than 50% right carotid stenosis. The patient's difficulties date back to approximately 2011 when she had a vertigo-like episode; she's undergone multiple tests and referrals to evaluate this problem. She has been diagnosed with possible vestibular neuritis, vestibular migraine, these conditions are under management at [facility]. She continues to suffers from balance issues and dizziness.   The patient denies any specific symptoms related to carotid occlusive disease. Specifically she denies numbness or weakness in either extremity. She denies slurred speech she denies amaurosis fugax  The patient does not have a history of smoking. She does have hypercholesterolemia. She is intolerant of statin medications and is maintained on a maximum dose of WelChol. She also suffers from mitral valve prolapse. She has a strong family history for premature cardiovascular disease.  Patient has not had previous carotid artery intervention. She is aware of her heart murmur, mitral valve prolapse, states this is monitored by her cardiologist.   The patient reports New Medical or Surgical History: cataract extraction in both eyes with IOL's. She is scheduled for right hip replacement in March 2017.  She was diagnosed with osteoporosis and was started on Fosamax. She also has osteoarthritis and sees Dr. Merlyn Albert and Dr. Ethelene Hal for ESI's and hip joint injections.  Pt Diabetic: No Pt smoker: non-smoker  Pt meds include: Statin : No: statins caused myalgias and arthralgias; pt states her last LDL was 127 ASA: Yes  Other anticoagulants/antiplatelets: no   Past Medical History  Diagnosis Date  . Hyperlipidemia   . Depression   . Myalgia   . DJD  (degenerative joint disease)   . Shingles   . Diverticulosis   . Osteopenia   . Vertigo   . Migraine   . Cyst of ovary, right   . Vestibular neuronitis   . Mitral valve regurgitation   . MVP (mitral valve prolapse)     Social History Social History  Substance Use Topics  . Smoking status: Never Smoker   . Smokeless tobacco: Never Used  . Alcohol Use: No    Family History No family history on file.  Surgical History No past surgical history on file.  Allergies  Allergen Reactions  . Codeine Nausea Only  . Sertraline Hcl   . Statins     Current Outpatient Prescriptions  Medication Sig Dispense Refill  . alendronate (FOSAMAX) 70 MG tablet Take 70 mg by mouth once a week. Take with a full glass of water on an empty stomach.    . Ascorbic Acid (VITAMIN C) 1000 MG tablet Take 1,000 mg by mouth daily.      Marland Kitchen aspirin EC 81 MG tablet Take 81 mg by mouth daily.      . butalbital-acetaminophen-caffeine (FIORICET WITH CODEINE) 50-325-40-30 MG per capsule Take 1 capsule by mouth every 4 (four) hours as needed.      . calcium carbonate (OS-CAL) 600 MG TABS Take 600 mg by mouth daily.      . Cholecalciferol (VITAMIN D3) 1000 UNITS CAPS Take 1 tablet by mouth daily.      . colesevelam (WELCHOL) 625 MG tablet Take 1,875 mg by mouth 2 (two) times daily with a meal.      . cromolyn (NASALCROM) 5.2 MG/ACT nasal spray Place  1 spray into the nose daily.      . Glucosamine-Chondroit-Vit C-Mn (GLUCOSAMINE 1500 COMPLEX PO) Take 1 tablet by mouth daily.      Marland Kitchen loratadine (CLARITIN) 10 MG tablet Take 10 mg by mouth daily.      Marland Kitchen LORazepam (ATIVAN) 0.5 MG tablet Take 0.5 mg by mouth every 6 (six) hours as needed.      . meclizine (ANTIVERT) 25 MG tablet Take 25 mg by mouth 2 (two) times daily as needed.      . Multiple Vitamins-Minerals (CENTRUM CARDIO) TABS Take 1 tablet by mouth 2 (two) times daily.      . Omega-3 Fatty Acids (FISH OIL) 1200 MG CAPS Take 1 capsule by mouth daily.       No  current facility-administered medications for this visit.    Review of Systems : See HPI for pertinent positives and negatives.  Physical Examination  There were no vitals filed for this visit. There is no weight on file to calculate BMI.  General: WDWN female in NAD GAIT: normal Eyes: PERRLA Pulmonary: Non-labored, CTAB, Negative Rales, Negative rhonchi, & Negative wheezing.  Cardiac: regular Rhythm, positive murmur.  VASCULAR EXAM Carotid Bruits Right Left   Transmitted cardiac murmur Transmitted cardiac murmur  Aorta is not palpable Radial pulses are 2+ palpable and equal.      LE Pulses Right Left   POPLITEAL not palpable  not palpable   POSTERIOR TIBIAL  palpable   palpable    DORSALIS PEDIS  ANTERIOR TIBIAL palpable  palpable     Gastrointestinal: soft, nontender, BS WNL, no r/g, no palpable masses.  Musculoskeletal: Negative muscle atrophy/wasting. M/S 5/5 throughout except 4/5 in right LE, Extremities without ischemic changes.  Neurologic: A&O X 3; Appropriate Affect,  Speech is normal CN 2-12 intact, Pain and light touch intact in extremities, Motor exam as listed above.         Non-Invasive Vascular Imaging CAROTID DUPLEX 11/01/2015   CEREBROVASCULAR DUPLEX EVALUATION    INDICATION: Carotid artery disease     PREVIOUS INTERVENTION(S):     DUPLEX EXAM:     RIGHT  LEFT  Peak Systolic Velocities (cm/s) End Diastolic Velocities (cm/s) Plaque LOCATION Peak Systolic Velocities (cm/s) End Diastolic Velocities (cm/s) Plaque  69 21  CCA PROXIMAL 82 24   68 21  CCA MID 70 20   72 22  CCA DISTAL 54 15   60 9  ECA 69 15   58 21 HT ICA PROXIMAL 31 11   75 29  ICA MID 75 30   76 25  ICA DISTAL 115 40     1.11 ICA / CCA Ratio (PSV) 1.64  Antegrade  Vertebral Flow Antegrade     Brachial Systolic Pressure (mmHg)   Multiphasic (Subclavian artery) Brachial Artery Waveforms Multiphasic (Subclavian artery)    Plaque Morphology:  HM = Homogeneous, HT = Heterogeneous, CP = Calcific Plaque, SP = Smooth Plaque, IP = Irregular Plaque  ADDITIONAL FINDINGS:     IMPRESSION: Right internal carotid artery velocities suggest a <40% stenosis.  No stenosis of the left internal carotid artery.    Compared to the previous exam:  No significant change in comparison to the last exam on 10/26/2014.      Assessment: Lori Frost is a 69 y.o. female who has no history of stroke or TIA. Today's carotid duplex continues to suggest minimal right ICA stenosis and no left ICA stenosis. We have monitored her carotid arteries since 2012 and there  has been no increase in stenosis. Therefore can stretch her surveillance to two years.  Plan: Follow-up in 2 years with Carotid Duplex scan.   I discussed in depth with the patient the nature of atherosclerosis, and emphasized the importance of maximal medical management including strict control of blood pressure, blood glucose, and lipid levels, obtaining regular exercise, and continued cessation of smoking.  The patient is aware that without maximal medical management the underlying atherosclerotic disease process will progress, limiting the benefit of any interventions. The patient was given information about stroke prevention and what symptoms should prompt the patient to seek immediate medical care. Thank you for allowing us to participate in this patient's care.  Charisse MarchSuzanne Nickel, RN, MSN, FNP-C [facility] Office: [phone]  Clinic Physician: Imogene BurnChen  11/01/2015 10:17 AM

## 2015-11-04 NOTE — Addendum Note (Signed)
Addended by: Adria DillELDRIDGE-LEWIS, Eros Montour L on: 11/04/2015 09:30 AM   Modules accepted: Orders

## 2015-12-24 DIAGNOSIS — Z01818 Encounter for other preprocedural examination: Secondary | ICD-10-CM | POA: Diagnosis not present

## 2015-12-24 DIAGNOSIS — E7800 Pure hypercholesterolemia, unspecified: Secondary | ICD-10-CM | POA: Diagnosis not present

## 2015-12-24 DIAGNOSIS — E785 Hyperlipidemia, unspecified: Secondary | ICD-10-CM | POA: Diagnosis not present

## 2015-12-24 DIAGNOSIS — I341 Nonrheumatic mitral (valve) prolapse: Secondary | ICD-10-CM | POA: Diagnosis not present

## 2015-12-24 DIAGNOSIS — Z01810 Encounter for preprocedural cardiovascular examination: Secondary | ICD-10-CM | POA: Diagnosis not present

## 2015-12-24 DIAGNOSIS — M25551 Pain in right hip: Secondary | ICD-10-CM | POA: Diagnosis not present

## 2015-12-24 DIAGNOSIS — R030 Elevated blood-pressure reading, without diagnosis of hypertension: Secondary | ICD-10-CM | POA: Diagnosis not present

## 2016-01-13 DIAGNOSIS — I341 Nonrheumatic mitral (valve) prolapse: Secondary | ICD-10-CM | POA: Diagnosis not present

## 2016-01-16 DIAGNOSIS — M1611 Unilateral primary osteoarthritis, right hip: Secondary | ICD-10-CM | POA: Diagnosis not present

## 2016-01-17 ENCOUNTER — Ambulatory Visit: Payer: Self-pay | Admitting: Orthopedic Surgery

## 2016-01-17 NOTE — H&P (Signed)
Lori Frost DOB: 1946/08/06 Single / Language: [place] / Race: White Female Date of Admission:  02/12/2016 CC:  Right Hip Pain History of Present Illness The patient is a 70 year old female who comes in for a preoperative History and Physical. The patient is scheduled for a right total hip arthroplasty (anterior) to be performed by Dr. Gus Rankin. Aluisio, MD at [facility] on 02-12-2016. The patient is a 70 year old female who presented for follow up of their hip. The patient is being followed for their right hip pain and osteoarthritis. They are out from cortisone injection. Symptoms reported include: pain, pain with weightbearing and difficulty ambulating. The patient feels that they are doing poorly and report their pain level to be moderate. The following medication has been used for pain control: Aleve as needed. The patient presented following cortisone injection and only received relief for 3 weeks. The patient has reported improvement of their symptoms with: NSAIDs and rest. History of IA injection that did not work. Patient has also went to a chiropractor for hip adjustments back in August 2016. Unfortunately, the right hip is getting progressively worse. Pain is through the groin and whole hip girdle. It radiates down her thigh. She is having worsening function. She has been treated with Dr. Mardene Celeste with chiropractic treatments and this helped her back. He did take an x-ray of her back that included the hip and the hip looks worse. It is felt that she would benefit from surgery.  They have been treated conservatively in the past for the above stated problem and despite conservative measures, they continue to have progressive pain and severe functional limitations and dysfunction. They have failed non-operative management including home exercise, medications, and injections. It is felt that they would benefit from undergoing total joint replacement. Risks and benefits of the procedure have  been discussed with the patient and they elect to proceed with surgery. There are no active contraindications to surgery such as ongoing infection or rapidly progressive neurological disease.  Problem List/Past Medical  Primary osteoarthritis of right hip (M16.11)  Back pain (M54.9)  Lumbar pain (M54.5)  Hip pain (M25.559)  Bursitis of right hip (M70.71)  Meniere disease (H81.09)  Chronic vestibular neuritis, bilateral (H81.23)  Osteoporosis  Heart murmur  MVP Mitral Valve Prolapse  Miltral Valve Regurgitation, Mild to Moderate  Hypercholesterolemia  Carotid Arterial Disease  Migraine Headache  Tinnitus  Vertigo  Shingles  Cataract  Menopause  Depression  Diverticulosis  Allergies  TraMADol HCl *ANALGESICS - OPIOID*  Itching, Rash. Codeine Phosphate *ANALGESICS - OPIOID*  Nausea. Crestor *ANTIHYPERLIPIDEMICS*  muscle aches Lipitor *ANTIHYPERLIPIDEMICS*  muscle aches Topamax *ANTICONVULSANTS*  Fatigue Zoloft *ANTIDEPRESSANTS*   Family History  Congestive Heart Failure  Mother, Sister. Heart Disease  Mother. First Degree Relatives  reported Diabetes Mellitus  Father, Mother. Heart disease in female family member before age 32  Heart disease in female family member before age 60  Cancer  Sister, First Degree Relatives.  Social History Exercise  Exercises rarely; does other Not under pain contract  Never consumed alcohol  11/22/2013: Never consumed alcohol Living situation  live alone Current work status  working full time Children  0 Number of flights of stairs before winded  4-5 Tobacco use  Never smoker. 11/22/2013 No history of drug/alcohol rehab  Marital status  single Tobacco / smoke exposure  11/22/2013: no  Medication History  Welchol (  Tablet, 3 Oral two times daily) Active. Alendronate Sodium (  Tablet, Oral) Active. (Fosamax generic) Trimethoprim (   Tablet, Oral daily) Active. Triamterene-HCTZ  (37.5-25MG  Capsule, Oral daily) Active. Fluocinolone Acetonide (0.01% Solution, External) Active. Clobetasol Propionate (0.05% Cream, External as needed) Active. (and oil for overnight) Fish Oil Burp-Less (  Capsule, Oral daily) Active. Calcium Gluconate (  Tablet, 1200-1500mg  Oral daily) Active. Glucosamine Chondroitin Complx (1200-1500mg  Oral daily) Active. D3 Adult (1000UNIT Tablet Chewable, Oral) Active. Aspirin EC (  Tablet DR, Oral daily) Active. Naproxen (  Tablet, 1 Tablet Oral at bedtime, Taken starting 01/16/2014) Active. NasalCrom (5.2MG /ACT Aerosol Soln, Nasal daily) Active. (once in each nostril) Loratadine (  Capsule, Oral daily) Active.  Past Surgical History Tonsillectomy  Age 72 Cataract Extraction-Bilateral  Date: 2016.  Review of Systems General Present- Night Sweats. Not Present- Chills, Fatigue, Fever, Memory Loss, Weight Gain and Weight Loss. Skin Not Present- Eczema, Hives, Itching, Lesions and Rash. HEENT Present- Headache and Hearing Loss. Not Present- Dentures, Double Vision, Tinnitus and Visual Loss. Respiratory Not Present- Allergies, Chronic Cough, Coughing up blood, Shortness of breath at rest and Shortness of breath with exertion. Cardiovascular Not Present- Chest Pain, Difficulty Breathing Lying Down, Murmur, Palpitations, Racing/skipping heartbeats and Swelling. Gastrointestinal Not Present- Abdominal Pain, Bloody Stool, Constipation, Diarrhea, Difficulty Swallowing, Heartburn, Jaundice, Loss of appetitie, Nausea and Vomiting. Female Genitourinary Not Present- Blood in Urine, Discharge, Flank Pain, Incontinence, Painful Urination, Urgency, Urinary frequency, Urinary Retention, Urinating at Night and Weak urinary stream. Musculoskeletal Present- Back Pain, Joint Pain and Morning Stiffness. Not Present- Joint Swelling, Muscle Pain, Muscle Weakness and Spasms. Neurological Present- Difficulty with balance and Dizziness. Not  Present- Blackout spells, Paralysis, Tremor and Weakness. Psychiatric Not Present- Insomnia.  Vitals  Weight: 120 lb Height: 62in Weight was reported by patient. Height was reported by patient. Body Surface Area: 1.54 m Body Mass Index: 21.95 kg/m  Pulse: 88 (Regular)  BP: 142/82 (Sitting, Right Arm, Standard)   Physical Exam General Mental Status -Alert, cooperative and good historian. General Appearance-pleasant, Not in acute distress. Orientation-Oriented X3. Build & Nutrition-Petite, Well nourished and Well developed.  Head and Neck Head-normocephalic, atraumatic . Neck Global Assessment - supple, no bruit auscultated on the right, no bruit auscultated on the left.  Eye Vision-Wears corrective lenses. Pupil - Bilateral-Regular and Round. Motion - Bilateral-EOMI.  Chest and Lung Exam Auscultation Breath sounds - clear at anterior chest wall and clear at posterior chest wall. Adventitious sounds - No Adventitious sounds.  Cardiovascular Auscultation Rhythm - Regular rate and rhythm. Heart Sounds - S1 WNL and S2 WNL. Murmurs & Other Heart Sounds: Murmur 1 - Location - Aortic Area. Timing - Mid-systolic. Grade - III/VI. Character - Crescendo/Decrescendo. Radiation - Carotids.  Abdomen Palpation/Percussion Tenderness - Abdomen is non-tender to palpation. Rigidity (guarding) - Abdomen is soft. Auscultation Auscultation of the abdomen reveals - Bowel sounds normal.  Female Genitourinary Note: Not done, not pertinent to present illness   Musculoskeletal Note: On exam, she is in no distress. Her left hip has normal range of motion with no discomfort. Her right hip can be flexed to 100, minimal internal rotation to about 20, external rotation to 20, abduction with pain throughout range of motion.  RADIOGRAPHS From of July here showed significant joint space narrowing on her right base. I saw the radiographs of the lumbar spine, which included  the hips and the right hip has gotten worse now compared to what it was in July.   Assessment & Plan Primary osteoarthritis of right hip (M16.11)  Note:Surgical Plans: Right Total Hip Replacement - Anterior Approach  Disposition: Looking into [facility]  PCP: Dr.  Juluis Rainier Cards: Dr. Donnie Aho - Patient has been seen preoperatively and felt to be stable for surgery.  Topical TXA - Carotid Arterial Disease  Anesthesia Issues: None  Signed electronically by Lauraine Rinne, III PA-C

## 2016-01-17 NOTE — H&P (Signed)
Lori Frost DOB: 08/16/1946 Single / Language: English / Race: White Female Date of Admission:  02/12/2016 CC:  Right Hip Pain History of Present Illness The patient is a 69 year old female who comes in for a preoperative History and Physical. The patient is scheduled for a right total hip arthroplasty (anterior) to be performed by Dr. Frank V. Aluisio, MD at [facility] on 02-12-2016. The patient is a 69 year old female who presented for follow up of their hip. The patient is being followed for their right hip pain and osteoarthritis. They are out from cortisone injection. Symptoms reported include: pain, pain with weightbearing and difficulty ambulating. The patient feels that they are doing poorly and report their pain level to be moderate. The following medication has been used for pain control: Aleve as needed. The patient presented following cortisone injection and only received relief for 3 weeks. The patient has reported improvement of their symptoms with: NSAIDs and rest. History of IA injection that did not work. Patient has also went to a chiropractor for hip adjustments back in August 2016. Unfortunately, the right hip is getting progressively worse. Pain is through the groin and whole hip girdle. It radiates down her thigh. She is having worsening function. She has been treated with Dr. Meylor with chiropractic treatments and this helped her back. He did take an x-ray of her back that included the hip and the hip looks worse. It is felt that she would benefit from surgery.  They have been treated conservatively in the past for the above stated problem and despite conservative measures, they continue to have progressive pain and severe functional limitations and dysfunction. They have failed non-operative management including home exercise, medications, and injections. It is felt that they would benefit from undergoing total joint replacement. Risks and benefits of the procedure have  been discussed with the patient and they elect to proceed with surgery. There are no active contraindications to surgery such as ongoing infection or rapidly progressive neurological disease.  Problem List/Past Medical  Primary osteoarthritis of right hip (M16.11)  Back pain (M54.9)  Lumbar pain (M54.5)  Hip pain (M25.559)  Bursitis of right hip (M70.71)  Meniere disease (H81.09)  Chronic vestibular neuritis, bilateral (H81.23)  Osteoporosis  Heart murmur  MVP Mitral Valve Prolapse  Miltral Valve Regurgitation, Mild to Moderate  Hypercholesterolemia  Carotid Arterial Disease  Migraine Headache  Tinnitus  Vertigo  Shingles  Cataract  Menopause  Depression  Diverticulosis  Allergies  TraMADol HCl *ANALGESICS - OPIOID*  Itching, Rash. Codeine Phosphate *ANALGESICS - OPIOID*  Nausea. Crestor *ANTIHYPERLIPIDEMICS*  muscle aches Lipitor *ANTIHYPERLIPIDEMICS*  muscle aches Topamax *ANTICONVULSANTS*  Fatigue Zoloft *ANTIDEPRESSANTS*   Family History  Congestive Heart Failure  Mother, Sister. Heart Disease  Mother. First Degree Relatives  reported Diabetes Mellitus  Father, Mother. Heart disease in female family member before age 55  Heart disease in female family member before age 65  Cancer  Sister, First Degree Relatives.  Social History Exercise  Exercises rarely; does other Not under pain contract  Never consumed alcohol  11/22/2013: Never consumed alcohol Living situation  live alone Current work status  working full time Children  0 Number of flights of stairs before winded  4-5 Tobacco use  Never smoker. 11/22/2013 No history of drug/alcohol rehab  Marital status  single Tobacco / smoke exposure  11/22/2013: no  Medication History  Welchol (625MG Tablet, 3 Oral two times daily) Active. Alendronate Sodium (70MG Tablet, Oral) Active. (Fosamax generic) Trimethoprim (  100MG Tablet, Oral daily) Active. Triamterene-HCTZ  (37.5-25MG Capsule, Oral daily) Active. Fluocinolone Acetonide (0.01% Solution, External) Active. Clobetasol Propionate (0.05% Cream, External as needed) Active. (and oil for overnight) Fish Oil Burp-Less (1200MG Capsule, Oral daily) Active. Calcium Gluconate (600MG Tablet, 1200-1500mg Oral daily) Active. Glucosamine Chondroitin Complx (1200-1500mg Oral daily) Active. D3 Adult (1000UNIT Tablet Chewable, Oral) Active. Aspirin EC (81MG Tablet DR, Oral daily) Active. Naproxen (500MG Tablet, 1 Tablet Oral at bedtime, Taken starting 01/16/2014) Active. NasalCrom (5.2MG/ACT Aerosol Soln, Nasal daily) Active. (once in each nostril) Loratadine (10MG Capsule, Oral daily) Active.  Past Surgical History Tonsillectomy  Age 11 Cataract Extraction-Bilateral  Date: 2016.  Review of Systems General Present- Night Sweats. Not Present- Chills, Fatigue, Fever, Memory Loss, Weight Gain and Weight Loss. Skin Not Present- Eczema, Hives, Itching, Lesions and Rash. HEENT Present- Headache and Hearing Loss. Not Present- Dentures, Double Vision, Tinnitus and Visual Loss. Respiratory Not Present- Allergies, Chronic Cough, Coughing up blood, Shortness of breath at rest and Shortness of breath with exertion. Cardiovascular Not Present- Chest Pain, Difficulty Breathing Lying Down, Murmur, Palpitations, Racing/skipping heartbeats and Swelling. Gastrointestinal Not Present- Abdominal Pain, Bloody Stool, Constipation, Diarrhea, Difficulty Swallowing, Heartburn, Jaundice, Loss of appetitie, Nausea and Vomiting. Female Genitourinary Not Present- Blood in Urine, Discharge, Flank Pain, Incontinence, Painful Urination, Urgency, Urinary frequency, Urinary Retention, Urinating at Night and Weak urinary stream. Musculoskeletal Present- Back Pain, Joint Pain and Morning Stiffness. Not Present- Joint Swelling, Muscle Pain, Muscle Weakness and Spasms. Neurological Present- Difficulty with balance and Dizziness. Not  Present- Blackout spells, Paralysis, Tremor and Weakness. Psychiatric Not Present- Insomnia.  Vitals  Weight: 120 lb Height: 62in Weight was reported by patient. Height was reported by patient. Body Surface Area: 1.54 m Body Mass Index: 21.95 kg/m  Pulse: 88 (Regular)  BP: 142/82 (Sitting, Right Arm, Standard)   Physical Exam General Mental Status -Alert, cooperative and good historian. General Appearance-pleasant, Not in acute distress. Orientation-Oriented X3. Build & Nutrition-Petite, Well nourished and Well developed.  Head and Neck Head-normocephalic, atraumatic . Neck Global Assessment - supple, no bruit auscultated on the right, no bruit auscultated on the left.  Eye Vision-Wears corrective lenses. Pupil - Bilateral-Regular and Round. Motion - Bilateral-EOMI.  Chest and Lung Exam Auscultation Breath sounds - clear at anterior chest wall and clear at posterior chest wall. Adventitious sounds - No Adventitious sounds.  Cardiovascular Auscultation Rhythm - Regular rate and rhythm. Heart Sounds - S1 WNL and S2 WNL. Murmurs & Other Heart Sounds: Murmur 1 - Location - Aortic Area. Timing - Mid-systolic. Grade - III/VI. Character - Crescendo/Decrescendo. Radiation - Carotids.  Abdomen Palpation/Percussion Tenderness - Abdomen is non-tender to palpation. Rigidity (guarding) - Abdomen is soft. Auscultation Auscultation of the abdomen reveals - Bowel sounds normal.  Female Genitourinary Note: Not done, not pertinent to present illness   Musculoskeletal Note: On exam, she is in no distress. Her left hip has normal range of motion with no discomfort. Her right hip can be flexed to 100, minimal internal rotation to about 20, external rotation to 20, abduction with pain throughout range of motion.  RADIOGRAPHS From of July here showed significant joint space narrowing on her right base. I saw the radiographs of the lumbar spine, which included  the hips and the right hip has gotten worse now compared to what it was in July.   Assessment & Plan Primary osteoarthritis of right hip (M16.11)  Note:Surgical Plans: Right Total Hip Replacement - Anterior Approach  Disposition: Looking into [facility]  PCP: Dr.   Elizabeth Barnes Cards: Dr. Tilley - Patient has been seen preoperatively and felt to be stable for surgery.  Topical TXA - Carotid Arterial Disease  Anesthesia Issues: None  Signed electronically by Royden Bulman L Redding Cloe, III PA-C  

## 2016-01-20 ENCOUNTER — Ambulatory Visit: Payer: Self-pay | Admitting: Orthopedic Surgery

## 2016-01-20 DIAGNOSIS — H2000 Unspecified acute and subacute iridocyclitis: Secondary | ICD-10-CM | POA: Diagnosis not present

## 2016-01-20 NOTE — Progress Notes (Signed)
Preoperative surgical orders have been place into the Epic hospital system for Lori Frost on 01/20/2016, 10:07 AM  by Patrica Duel for surgery on 02-12-16.  Preop Total Hip - Anterior Approach orders including IV Tylenol, and IV Decadron as long as there are no contraindications to the above medications. Avel Peace, PA-C

## 2016-01-22 DIAGNOSIS — H2000 Unspecified acute and subacute iridocyclitis: Secondary | ICD-10-CM | POA: Diagnosis not present

## 2016-01-27 DIAGNOSIS — H2000 Unspecified acute and subacute iridocyclitis: Secondary | ICD-10-CM | POA: Diagnosis not present

## 2016-02-03 NOTE — Patient Instructions (Addendum)
YOUR PROCEDURE IS SCHEDULED ON : 02/12/16  REPORT TO [facility] MAIN ENTRANCE FOLLOW SIGNS TO EAST ELEVATOR - GO TO 3rd FLOOR CHECK IN AT 3 EAST NURSES STATION (SHORT STAY) AT:  2:00 PM  CALL THIS NUMBER IF YOU HAVE PROBLEMS THE MORNING OF SURGERY [phone]  REMEMBER:ONLY 1 PER PERSON MAY GO TO SHORT STAY WITH YOU TO GET READY THE MORNING OF YOUR SURGERY  DO NOT EAT FOOD  AFTER MIDNIGHT  STOP ASPIRIN / IBUPROFEN / ALEVE / VITAMINS / HERBAL MEDS __5__ DAYS BEFORE SURGERY  MAY HAVE CLEAR LIQUIDS UNTIL 10:00 AM  TAKE THESE MEDICINES THE MORNING OF SURGERY: TRIMETHOPRIM / ZANTAC / USE EYE DROPS  CLEAR LIQUID DIET  Foods Allowed                                                                     Foods Excluded  Coffee and tea, regular and decaf                             liquids that you cannot  Plain Jell-O in any flavor                                             see through such as: Fruit ices (not with fruit pulp)                                     milk, soups, orange juice  Iced Popsicles                                                        All solid food Carbonated beverages, regular and diet                                    Cranberry, grape and apple juices Sports drinks like Gatorade Lightly seasoned clear broth or consume(fat free) Sugar, honey syrup _____________________________________________________________________    YOU MAY NOT HAVE ANY METAL ON YOUR BODY INCLUDING HAIR PINS AND PIERCING'S. DO NOT WEAR JEWELRY, MAKEUP, LOTIONS, POWDERS OR PERFUMES. DO NOT WEAR NAIL POLISH. DO NOT SHAVE 48 HRS PRIOR TO SURGERY. MEN MAY SHAVE FACE AND NECK.  DO NOT BRING VALUABLES TO HOSPITAL. [facility] IS NOT RESPONSIBLE FOR VALUABLES.  CONTACTS, DENTURES OR PARTIALS MAY NOT BE WORN TO SURGERY. LEAVE SUITCASE IN CAR. CAN BE BROUGHT TO ROOM AFTER SURGERY.  PATIENTS DISCHARGED THE DAY OF SURGERY WILL NOT BE ALLOWED TO DRIVE HOME.  PLEASE READ OVER THE  FOLLOWING INSTRUCTION SHEETS _________________________________________________________________________________  [facility] - PREPARING FOR SURGERY  Before surgery, you can play an important role.  Because skin is not sterile, your skin needs to be as free of germs as possible.  You can reduce the number of germs on your skin by washing with CHG (chlorahexidine gluconate) soap before surgery.  CHG is an antiseptic cleaner which kills germs and bonds with the skin to continue killing germs even after washing. Please DO NOT use if you have an allergy to CHG or antibacterial soaps.  If your skin becomes reddened/irritated stop using the CHG and inform your nurse when you arrive at Short Stay. Do not shave (including legs and underarms) for at least 48 hours prior to the first CHG shower.  You may shave your face. Please follow these instructions carefully:   1.  Shower with CHG Soap the night before surgery and the  morning of Surgery.   2.  If you choose to wash your hair, wash your hair first as usual with your  normal  Shampoo.   3.  After you shampoo, rinse your hair and body thoroughly to remove the  shampoo.                                         4.  Use CHG as you would any other liquid soap.  You can apply chg directly  to the skin and wash . Gently wash with scrungie or clean wascloth    5.  Apply the CHG Soap to your body ONLY FROM THE NECK DOWN.   Do not use on open                           Wound or open sores. Avoid contact with eyes, ears mouth and genitals (private parts).                        Genitals (private parts) with your normal soap.              6.  Wash thoroughly, paying special attention to the area where your surgery  will be performed.   7.  Thoroughly rinse your body with warm water from the neck down.   8.  DO NOT shower/wash with your normal soap after using and rinsing off  the CHG Soap .                9.  Pat  yourself dry with a clean towel.             10.  Wear clean night clothes to bed after shower             11.  Place clean sheets on your bed the night of your first shower and do not  sleep with pets.  Day of Surgery : Do not apply any lotions/deodorants the morning of surgery.  Please wear clean clothes to the hospital/surgery center.  FAILURE TO FOLLOW THESE INSTRUCTIONS MAY RESULT IN THE CANCELLATION OF YOUR SURGERY    PATIENT SIGNATURE_________________________________  ______________________________________________________________________     Lori Frost  An incentive spirometer is a tool that can help keep your lungs clear and active. This tool measures how well you are filling your lungs with each breath. Taking long deep breaths may help reverse or decrease the chance of developing breathing (pulmonary)  problems (especially infection) following:  A long period of time when you are unable to move or be active. BEFORE THE PROCEDURE   If the spirometer includes an indicator to show your best effort, your nurse or respiratory therapist will set it to a desired goal.  If possible, sit up straight or lean slightly forward. Try not to slouch.  Hold the incentive spirometer in an upright position. INSTRUCTIONS FOR USE   Sit on the edge of your bed if possible, or sit up as far as you can in bed or on a chair.  Hold the incentive spirometer in an upright position.  Breathe out normally.  Place the mouthpiece in your mouth and seal your lips tightly around it.  Breathe in slowly and as deeply as possible, raising the piston or the ball toward the top of the column.  Hold your breath for 3-5 seconds or for as long as possible. Allow the piston or ball to fall to the bottom of the column.  Remove the mouthpiece from your mouth and breathe out normally.  Rest for a few seconds and repeat Steps 1 through 7 at least 10 times every 1-2 hours when you are awake. Take  your time and take a few normal breaths between deep breaths.  The spirometer may include an indicator to show your best effort. Use the indicator as a goal to work toward during each repetition.  After each set of 10 deep breaths, practice coughing to be sure your lungs are clear. If you have an incision (the cut made at the time of surgery), support your incision when coughing by placing a pillow or rolled up towels firmly against it. Once you are able to get out of bed, walk around indoors and cough well. You may stop using the incentive spirometer when instructed by your caregiver.  RISKS AND COMPLICATIONS  Take your time so you do not get dizzy or light-headed.  If you are in pain, you may need to take or ask for pain medication before doing incentive spirometry. It is harder to take a deep breath if you are having pain. AFTER USE  Rest and breathe slowly and easily.  It can be helpful to keep track of a log of your progress. Your caregiver can provide you with a simple table to help with this. If you are using the spirometer at home, follow these instructions: SEEK MEDICAL CARE IF:   You are having difficultly using the spirometer.  You have trouble using the spirometer as often as instructed.  Your pain medication is not giving enough relief while using the spirometer.  You develop fever of 100.5 F (38.1 C) or higher. SEEK IMMEDIATE MEDICAL CARE IF:   You cough up bloody sputum that had not been present before.  You develop fever of 102 F (38.9 C) or greater.  You develop worsening pain at or near the incision site. MAKE SURE YOU:   Understand these instructions.  Will watch your condition.  Will get help right away if you are not doing well or get worse. Document Released: 04/12/2007 Document Revised: 02/22/2012 Document Reviewed: 06/13/2007 ExitCare Patient Information 2014 ExitCare,  [place].   ________________________________________________________________________  WHAT IS A BLOOD TRANSFUSION? Blood Transfusion Information  A transfusion is the replacement of blood or some of its parts. Blood is made up of multiple cells which provide different functions.  Red blood cells carry oxygen and are used for blood loss replacement.  White blood cells fight against infection.  Platelets control bleeding.  Plasma helps clot blood.  Other blood products are available for specialized needs, such as hemophilia or other clotting disorders. BEFORE THE TRANSFUSION  Who gives blood for transfusions?   Healthy volunteers who are fully evaluated to make sure their blood is safe. This is blood bank blood. Transfusion therapy is the safest it has ever been in the practice of medicine. Before blood is taken from a donor, a complete history is taken to make sure that person has no history of diseases nor engages in risky social behavior (examples are intravenous drug use or sexual activity with multiple partners). The donor's travel history is screened to minimize risk of transmitting infections, such as malaria. The donated blood is tested for signs of infectious diseases, such as HIV and hepatitis. The blood is then tested to be sure it is compatible with you in order to minimize the chance of a transfusion reaction. If you or a relative donates blood, this is often done in anticipation of surgery and is not appropriate for emergency situations. It takes many days to process the donated blood. RISKS AND COMPLICATIONS Although transfusion therapy is very safe and saves many lives, the main dangers of transfusion include:   Getting an infectious disease.  Developing a transfusion reaction. This is an allergic reaction to something in the blood you were given. Every precaution is taken to prevent this. The decision to have a blood transfusion has been considered carefully by your caregiver  before blood is given. Blood is not given unless the benefits outweigh the risks. AFTER THE TRANSFUSION  Right after receiving a blood transfusion, you will usually feel much better and more energetic. This is especially true if your red blood cells have gotten low (anemic). The transfusion raises the level of the red blood cells which carry oxygen, and this usually causes an energy increase.  The nurse administering the transfusion will monitor you carefully for complications. HOME CARE INSTRUCTIONS  No special instructions are needed after a transfusion. You may find your energy is better. Speak with your caregiver about any limitations on activity for underlying diseases you may have. SEEK MEDICAL CARE IF:   Your condition is not improving after your transfusion.  You develop redness or irritation at the intravenous (IV) site. SEEK IMMEDIATE MEDICAL CARE IF:  Any of the following symptoms occur over the next 12 hours:  Shaking chills.  You have a temperature by mouth above 102 F (38.9 C), not controlled by medicine.  Chest, back, or muscle pain.  People around you feel you are not acting correctly or are confused.  Shortness of breath or difficulty breathing.  Dizziness and fainting.  You get a rash or develop hives.  You have a decrease in urine output.  Your urine turns a dark color or changes to pink, red, or brown. Any of the following symptoms occur over the next 10 days:  You have a temperature by mouth above 102 F (38.9 C), not controlled by medicine.  Shortness of breath.  Weakness after normal activity.  The white part of the eye turns yellow (jaundice).  You have a decrease in the amount of urine or are urinating less often.  Your urine turns a dark color or changes to pink, red, or brown. Document Released: 11/27/2000 Document Revised: 02/22/2012 Document Reviewed: 07/16/2008 [facility] Patient Information 2014 [place],  [place].  _______________________________________________________________________

## 2016-02-04 ENCOUNTER — Encounter (HOSPITAL_COMMUNITY): Payer: Self-pay

## 2016-02-04 ENCOUNTER — Encounter (HOSPITAL_COMMUNITY)
Admission: RE | Admit: 2016-02-04 | Discharge: 2016-02-04 | Disposition: A | Discharge status: Home / Self Care | Payer: PPO | Source: Ambulatory Visit | Attending: Orthopedic Surgery | Admitting: Orthopedic Surgery

## 2016-02-04 DIAGNOSIS — Z01812 Encounter for preprocedural laboratory examination: Secondary | ICD-10-CM | POA: Diagnosis not present

## 2016-02-04 DIAGNOSIS — M1611 Unilateral primary osteoarthritis, right hip: Secondary | ICD-10-CM | POA: Diagnosis not present

## 2016-02-04 DIAGNOSIS — Z0183 Encounter for blood typing: Secondary | ICD-10-CM | POA: Insufficient documentation

## 2016-02-04 HISTORY — DX: Personal history of urinary (tract) infections: Z87.440

## 2016-02-04 HISTORY — DX: Meniere's disease, unspecified ear: H81.09

## 2016-02-04 HISTORY — DX: Dermatitis, unspecified: L30.9

## 2016-02-04 HISTORY — DX: Personal history of other diseases of the digestive system: Z87.19

## 2016-02-04 HISTORY — DX: Labyrinthine dysfunction, unspecified ear: H83.2X9

## 2016-02-04 HISTORY — DX: Family history of other specified conditions: Z84.89

## 2016-02-04 HISTORY — DX: Age-related osteoporosis without current pathological fracture: M81.0

## 2016-02-04 HISTORY — DX: Unspecified disorder of vestibular function, unspecified ear: H81.90

## 2016-02-04 HISTORY — DX: Mydriasis: H57.04

## 2016-02-04 LAB — URINALYSIS, ROUTINE W REFLEX MICROSCOPIC
Bilirubin Urine: NEGATIVE
Glucose, UA: NEGATIVE mg/dL
Hgb urine dipstick: NEGATIVE
KETONES UR: NEGATIVE mg/dL
LEUKOCYTES UA: NEGATIVE
Nitrite: NEGATIVE
PROTEIN: NEGATIVE mg/dL
Specific Gravity, Urine: 1.012 (ref 1.005–1.030)
pH: 6.5 (ref 5.0–8.0)

## 2016-02-04 LAB — COMPREHENSIVE METABOLIC PANEL
ALBUMIN: 4.2 g/dL (ref 3.5–5.0)
ALK PHOS: 57 U/L (ref 38–126)
ALT: 19 U/L (ref 14–54)
AST: 27 U/L (ref 15–41)
Anion gap: 8 (ref 5–15)
BUN: 24 mg/dL — AB (ref 6–20)
CALCIUM: 9.8 mg/dL (ref 8.9–10.3)
CO2: 28 mmol/L (ref 22–32)
CREATININE: 0.89 mg/dL (ref 0.44–1.00)
Chloride: 102 mmol/L (ref 101–111)
GFR calc Af Amer: >60 mL/min (ref >60)
GFR calc non Af Amer: >60 mL/min (ref >60)
GLUCOSE: 97 mg/dL (ref 65–99)
Potassium: 5.5 mmol/L — ABNORMAL HIGH (ref 3.5–5.1)
SODIUM: 138 mmol/L (ref 135–145)
Total Bilirubin: 0.3 mg/dL (ref 0.3–1.2)
Total Protein: 7 g/dL (ref 6.5–8.1)

## 2016-02-04 LAB — APTT: aPTT: 28 seconds (ref 24–37)

## 2016-02-04 LAB — ABO/RH: ABO/RH(D): A POS

## 2016-02-04 LAB — CBC
HEMATOCRIT: 40.5 % (ref 36.0–46.0)
Hemoglobin: 12.9 g/dL (ref 12.0–15.0)
MCH: 28.7 pg (ref 26.0–34.0)
MCHC: 31.9 g/dL (ref 30.0–36.0)
MCV: 90.2 fL (ref 78.0–100.0)
Platelets: 288 10*3/uL (ref 150–400)
RBC: 4.49 MIL/uL (ref 3.87–5.11)
RDW: 14 % (ref 11.5–15.5)
WBC: 7.7 10*3/uL (ref 4.0–10.5)

## 2016-02-04 LAB — PROTIME-INR
INR: 1.06 (ref 0.00–1.49)
Prothrombin Time: 13.6 seconds (ref 11.6–15.2)

## 2016-02-04 LAB — SURGICAL PCR SCREEN
MRSA, PCR: NEGATIVE
Staphylococcus aureus: NEGATIVE

## 2016-02-07 DIAGNOSIS — Z961 Presence of intraocular lens: Secondary | ICD-10-CM | POA: Diagnosis not present

## 2016-02-07 DIAGNOSIS — H20011 Primary iridocyclitis, right eye: Secondary | ICD-10-CM | POA: Diagnosis not present

## 2016-02-10 NOTE — Progress Notes (Signed)
Pt notified of time change to 1:50 pm - instructed to arrive at 11:45 am - may have clear liquids until 7:45 am

## 2016-02-12 ENCOUNTER — Inpatient Hospital Stay (HOSPITAL_COMMUNITY): Payer: PPO | Admitting: Registered Nurse

## 2016-02-12 ENCOUNTER — Encounter (HOSPITAL_COMMUNITY): Payer: Self-pay | Admitting: Registered Nurse

## 2016-02-12 ENCOUNTER — Inpatient Hospital Stay (HOSPITAL_COMMUNITY): Payer: PPO

## 2016-02-12 ENCOUNTER — Encounter (HOSPITAL_COMMUNITY)
Admission: RE | Disposition: A | Discharge status: Skilled Nursing Facility | Payer: Self-pay | Source: Ambulatory Visit | Attending: Orthopedic Surgery

## 2016-02-12 ENCOUNTER — Inpatient Hospital Stay (HOSPITAL_COMMUNITY)
Admission: RE | Admit: 2016-02-12 | Discharge: 2016-02-14 | DRG: 470 | Disposition: A | Discharge status: Skilled Nursing Facility | Payer: PPO | Source: Ambulatory Visit | Attending: Orthopedic Surgery | Admitting: Orthopedic Surgery

## 2016-02-12 DIAGNOSIS — H933X9 Disorders of unspecified acoustic nerve: Secondary | ICD-10-CM | POA: Diagnosis not present

## 2016-02-12 DIAGNOSIS — Z79899 Other long term (current) drug therapy: Secondary | ICD-10-CM

## 2016-02-12 DIAGNOSIS — I341 Nonrheumatic mitral (valve) prolapse: Secondary | ICD-10-CM | POA: Diagnosis not present

## 2016-02-12 DIAGNOSIS — Z8249 Family history of ischemic heart disease and other diseases of the circulatory system: Secondary | ICD-10-CM

## 2016-02-12 DIAGNOSIS — Z471 Aftercare following joint replacement surgery: Secondary | ICD-10-CM | POA: Diagnosis not present

## 2016-02-12 DIAGNOSIS — E785 Hyperlipidemia, unspecified: Secondary | ICD-10-CM | POA: Diagnosis not present

## 2016-02-12 DIAGNOSIS — Z96649 Presence of unspecified artificial hip joint: Secondary | ICD-10-CM

## 2016-02-12 DIAGNOSIS — Z833 Family history of diabetes mellitus: Secondary | ICD-10-CM

## 2016-02-12 DIAGNOSIS — M1611 Unilateral primary osteoarthritis, right hip: Principal | ICD-10-CM | POA: Diagnosis present

## 2016-02-12 DIAGNOSIS — M810 Age-related osteoporosis without current pathological fracture: Secondary | ICD-10-CM | POA: Diagnosis not present

## 2016-02-12 DIAGNOSIS — E7800 Pure hypercholesterolemia, unspecified: Secondary | ICD-10-CM | POA: Diagnosis not present

## 2016-02-12 DIAGNOSIS — Z885 Allergy status to narcotic agent status: Secondary | ICD-10-CM

## 2016-02-12 DIAGNOSIS — F329 Major depressive disorder, single episode, unspecified: Secondary | ICD-10-CM | POA: Diagnosis present

## 2016-02-12 DIAGNOSIS — G43909 Migraine, unspecified, not intractable, without status migrainosus: Secondary | ICD-10-CM | POA: Diagnosis present

## 2016-02-12 DIAGNOSIS — M169 Osteoarthritis of hip, unspecified: Secondary | ICD-10-CM

## 2016-02-12 DIAGNOSIS — Z888 Allergy status to other drugs, medicaments and biological substances status: Secondary | ICD-10-CM | POA: Diagnosis not present

## 2016-02-12 DIAGNOSIS — Z96641 Presence of right artificial hip joint: Secondary | ICD-10-CM | POA: Diagnosis not present

## 2016-02-12 DIAGNOSIS — I739 Peripheral vascular disease, unspecified: Secondary | ICD-10-CM | POA: Diagnosis not present

## 2016-02-12 DIAGNOSIS — I10 Essential (primary) hypertension: Secondary | ICD-10-CM | POA: Diagnosis not present

## 2016-02-12 DIAGNOSIS — Z7982 Long term (current) use of aspirin: Secondary | ICD-10-CM | POA: Diagnosis not present

## 2016-02-12 HISTORY — PX: TOTAL HIP ARTHROPLASTY: SHX124

## 2016-02-12 HISTORY — DX: Osteoarthritis of hip, unspecified: M16.9

## 2016-02-12 LAB — TYPE AND SCREEN
ABO/RH(D): A POS
Antibody Screen: NEGATIVE

## 2016-02-12 LAB — POTASSIUM: POTASSIUM: 3.9 mmol/L (ref 3.5–5.1)

## 2016-02-12 SURGERY — ARTHROPLASTY, HIP, TOTAL, ANTERIOR APPROACH
Anesthesia: Spinal | Site: Hip | Laterality: Right

## 2016-02-12 MED ORDER — TRANEXAMIC ACID 1000 MG/10ML IV SOLN
2000.0000 mg | INTRAVENOUS | Status: DC | PRN
  Administered 2016-02-12: 2000 mg via TOPICAL

## 2016-02-12 MED ORDER — DEXAMETHASONE SODIUM PHOSPHATE 10 MG/ML IJ SOLN
10.0000 mg | Freq: Once | INTRAMUSCULAR | Status: AC
  Administered 2016-02-12: 10 mg via INTRAVENOUS

## 2016-02-12 MED ORDER — BUPIVACAINE HCL (PF) 0.25 % IJ SOLN
INTRAMUSCULAR | Status: AC
  Filled 2016-02-12: qty 30

## 2016-02-12 MED ORDER — FENTANYL CITRATE (PF) 100 MCG/2ML IJ SOLN
INTRAMUSCULAR | Status: DC | PRN
  Administered 2016-02-12: 25 ug via INTRAVENOUS
  Administered 2016-02-12: 50 ug via INTRAVENOUS
  Administered 2016-02-12: 25 ug via INTRAVENOUS

## 2016-02-12 MED ORDER — EPHEDRINE SULFATE 50 MG/ML IJ SOLN
INTRAMUSCULAR | Status: AC
  Filled 2016-02-12: qty 1

## 2016-02-12 MED ORDER — METHOCARBAMOL 500 MG PO TABS
500.0000 mg | ORAL_TABLET | Freq: Four times a day (QID) | ORAL | Status: DC | PRN
  Administered 2016-02-12 – 2016-02-14 (×4): 500 mg via ORAL
  Filled 2016-02-12 (×4): qty 1

## 2016-02-12 MED ORDER — HYDROMORPHONE HCL 1 MG/ML IJ SOLN
INTRAMUSCULAR | Status: AC
  Filled 2016-02-12: qty 1

## 2016-02-12 MED ORDER — RIVAROXABAN 10 MG PO TABS
10.0000 mg | ORAL_TABLET | Freq: Every day | ORAL | Status: DC
  Administered 2016-02-13 – 2016-02-14 (×2): 10 mg via ORAL
  Filled 2016-02-12 (×3): qty 1

## 2016-02-12 MED ORDER — TRIAMTERENE-HCTZ 37.5-25 MG PO TABS
1.0000 | ORAL_TABLET | Freq: Every day | ORAL | Status: DC
  Administered 2016-02-14: 1 via ORAL
  Filled 2016-02-12 (×2): qty 1

## 2016-02-12 MED ORDER — PROPOFOL 10 MG/ML IV BOLUS
INTRAVENOUS | Status: DC | PRN
  Administered 2016-02-12: 30 mg via INTRAVENOUS

## 2016-02-12 MED ORDER — MIDAZOLAM HCL 5 MG/5ML IJ SOLN
INTRAMUSCULAR | Status: DC | PRN
  Administered 2016-02-12: 2 mg via INTRAVENOUS

## 2016-02-12 MED ORDER — PROMETHAZINE HCL 25 MG/ML IJ SOLN: 6.2500 mg | INTRAMUSCULAR | Status: DC | PRN

## 2016-02-12 MED ORDER — PREDNISOLONE ACETATE 1 % OP SUSP
1.0000 [drp] | Freq: Three times a day (TID) | OPHTHALMIC | Status: DC
  Administered 2016-02-12 – 2016-02-14 (×4): 1 [drp] via OPHTHALMIC
  Filled 2016-02-12: qty 1

## 2016-02-12 MED ORDER — EPHEDRINE SULFATE 50 MG/ML IJ SOLN
INTRAMUSCULAR | Status: DC | PRN
  Administered 2016-02-12: 5 mg via INTRAVENOUS
  Administered 2016-02-12: 10 mg via INTRAVENOUS
  Administered 2016-02-12: 5 mg via INTRAVENOUS
  Administered 2016-02-12: 10 mg via INTRAVENOUS
  Administered 2016-02-12: 5 mg via INTRAVENOUS

## 2016-02-12 MED ORDER — ONDANSETRON HCL 4 MG/2ML IJ SOLN: 4.0000 mg | Freq: Four times a day (QID) | INTRAMUSCULAR | Status: DC | PRN

## 2016-02-12 MED ORDER — LACTATED RINGERS IV SOLN: INTRAVENOUS | Status: DC

## 2016-02-12 MED ORDER — MIDAZOLAM HCL 2 MG/2ML IJ SOLN
INTRAMUSCULAR | Status: AC
  Filled 2016-02-12: qty 2

## 2016-02-12 MED ORDER — BUPIVACAINE IN DEXTROSE 0.75-8.25 % IT SOLN
INTRATHECAL | Status: DC | PRN
  Administered 2016-02-12: 1.8 mL via INTRATHECAL

## 2016-02-12 MED ORDER — BISACODYL 10 MG RE SUPP: 10.0000 mg | Freq: Every day | RECTAL | Status: DC | PRN

## 2016-02-12 MED ORDER — DIAZEPAM 2 MG PO TABS: 2.0000 mg | ORAL_TABLET | Freq: Three times a day (TID) | ORAL | Status: DC | PRN

## 2016-02-12 MED ORDER — MIDAZOLAM HCL 2 MG/2ML IJ SOLN: 0.5000 mg | Freq: Once | INTRAMUSCULAR | Status: DC | PRN

## 2016-02-12 MED ORDER — DEXAMETHASONE SODIUM PHOSPHATE 10 MG/ML IJ SOLN
10.0000 mg | Freq: Once | INTRAMUSCULAR | Status: AC
  Administered 2016-02-13: 10 mg via INTRAVENOUS
  Filled 2016-02-12: qty 1

## 2016-02-12 MED ORDER — FENTANYL CITRATE (PF) 100 MCG/2ML IJ SOLN
INTRAMUSCULAR | Status: AC
  Filled 2016-02-12: qty 2

## 2016-02-12 MED ORDER — ACETAMINOPHEN 650 MG RE SUPP: 650.0000 mg | Freq: Four times a day (QID) | RECTAL | Status: DC | PRN

## 2016-02-12 MED ORDER — FLEET ENEMA 7-19 GM/118ML RE ENEM: 1.0000 | ENEMA | Freq: Once | RECTAL | Status: DC | PRN

## 2016-02-12 MED ORDER — METOCLOPRAMIDE HCL 10 MG PO TABS: 5.0000 mg | ORAL_TABLET | Freq: Three times a day (TID) | ORAL | Status: DC | PRN

## 2016-02-12 MED ORDER — GUAIFENESIN ER 600 MG PO TB12: 1200.0000 mg | ORAL_TABLET | Freq: Two times a day (BID) | ORAL | Status: DC | PRN

## 2016-02-12 MED ORDER — DEXAMETHASONE SODIUM PHOSPHATE 10 MG/ML IJ SOLN
INTRAMUSCULAR | Status: AC
  Filled 2016-02-12: qty 1

## 2016-02-12 MED ORDER — ACETAMINOPHEN 500 MG PO TABS
1000.0000 mg | ORAL_TABLET | Freq: Four times a day (QID) | ORAL | Status: AC
  Administered 2016-02-12 – 2016-02-13 (×4): 1000 mg via ORAL
  Filled 2016-02-12 (×4): qty 2

## 2016-02-12 MED ORDER — PHENOL 1.4 % MT LIQD: 1.0000 | OROMUCOSAL | Status: DC | PRN

## 2016-02-12 MED ORDER — HYDROMORPHONE HCL 2 MG PO TABS
2.0000 mg | ORAL_TABLET | ORAL | Status: DC | PRN
  Administered 2016-02-12 – 2016-02-14 (×7): 2 mg via ORAL
  Administered 2016-02-14: 4 mg via ORAL
  Administered 2016-02-14 (×2): 2 mg via ORAL
  Filled 2016-02-12 (×7): qty 1
  Filled 2016-02-12: qty 2
  Filled 2016-02-12 (×3): qty 1

## 2016-02-12 MED ORDER — ONDANSETRON HCL 4 MG/2ML IJ SOLN
INTRAMUSCULAR | Status: DC | PRN
  Administered 2016-02-12: 4 mg via INTRAVENOUS

## 2016-02-12 MED ORDER — CEFAZOLIN SODIUM-DEXTROSE 2-3 GM-% IV SOLR
2.0000 g | Freq: Four times a day (QID) | INTRAVENOUS | Status: AC
  Administered 2016-02-12 – 2016-02-13 (×2): 2 g via INTRAVENOUS
  Filled 2016-02-12 (×2): qty 50

## 2016-02-12 MED ORDER — TRIMETHOPRIM 100 MG PO TABS
100.0000 mg | ORAL_TABLET | Freq: Every day | ORAL | Status: DC
  Administered 2016-02-13 – 2016-02-14 (×2): 100 mg via ORAL
  Filled 2016-02-12 (×2): qty 1

## 2016-02-12 MED ORDER — ACETAMINOPHEN 10 MG/ML IV SOLN
INTRAVENOUS | Status: AC
  Filled 2016-02-12: qty 100

## 2016-02-12 MED ORDER — METOCLOPRAMIDE HCL 5 MG/ML IJ SOLN: 5.0000 mg | Freq: Three times a day (TID) | INTRAMUSCULAR | Status: DC | PRN

## 2016-02-12 MED ORDER — SODIUM CHLORIDE 0.9 % IV SOLN: INTRAVENOUS | Status: DC

## 2016-02-12 MED ORDER — SODIUM CHLORIDE 0.9 % IV SOLN
INTRAVENOUS | Status: DC
  Administered 2016-02-12: 75 mL/h via INTRAVENOUS

## 2016-02-12 MED ORDER — SODIUM CHLORIDE 0.9 % IJ SOLN
INTRAMUSCULAR | Status: AC
  Filled 2016-02-12: qty 10

## 2016-02-12 MED ORDER — CEFAZOLIN SODIUM-DEXTROSE 2-3 GM-% IV SOLR
2.0000 g | INTRAVENOUS | Status: AC
  Administered 2016-02-12: 2 g via INTRAVENOUS

## 2016-02-12 MED ORDER — DIPHENHYDRAMINE HCL 12.5 MG/5ML PO ELIX: 12.5000 mg | ORAL_SOLUTION | ORAL | Status: DC | PRN

## 2016-02-12 MED ORDER — LIDOCAINE HCL (CARDIAC) 20 MG/ML IV SOLN
INTRAVENOUS | Status: AC
  Filled 2016-02-12: qty 5

## 2016-02-12 MED ORDER — HYDROMORPHONE HCL 1 MG/ML IJ SOLN
0.5000 mg | INTRAMUSCULAR | Status: DC | PRN
Start: 2016-02-12 — End: 2016-02-14
  Administered 2016-02-12: 0.5 mg via INTRAVENOUS
  Filled 2016-02-12: qty 1

## 2016-02-12 MED ORDER — CEFAZOLIN SODIUM-DEXTROSE 2-3 GM-% IV SOLR
INTRAVENOUS | Status: AC
  Filled 2016-02-12: qty 50

## 2016-02-12 MED ORDER — LACTATED RINGERS IV SOLN
INTRAVENOUS | Status: DC | PRN
  Administered 2016-02-12 (×3): via INTRAVENOUS

## 2016-02-12 MED ORDER — COLESEVELAM HCL 625 MG PO TABS
1875.0000 mg | ORAL_TABLET | Freq: Two times a day (BID) | ORAL | Status: DC
  Administered 2016-02-13 – 2016-02-14 (×3): 1875 mg via ORAL
  Filled 2016-02-12 (×5): qty 3

## 2016-02-12 MED ORDER — PROPOFOL 500 MG/50ML IV EMUL
INTRAVENOUS | Status: DC | PRN
  Administered 2016-02-12: 50 ug/kg/min via INTRAVENOUS

## 2016-02-12 MED ORDER — POLYETHYLENE GLYCOL 3350 17 G PO PACK: 17.0000 g | PACK | Freq: Every day | ORAL | Status: DC | PRN

## 2016-02-12 MED ORDER — ONDANSETRON HCL 4 MG/2ML IJ SOLN
INTRAMUSCULAR | Status: AC
  Filled 2016-02-12: qty 2

## 2016-02-12 MED ORDER — ACETAMINOPHEN 10 MG/ML IV SOLN
1000.0000 mg | Freq: Once | INTRAVENOUS | Status: AC
  Administered 2016-02-12: 1000 mg via INTRAVENOUS

## 2016-02-12 MED ORDER — BUPIVACAINE HCL (PF) 0.25 % IJ SOLN
INTRAMUSCULAR | Status: DC | PRN
  Administered 2016-02-12: 30 mL

## 2016-02-12 MED ORDER — HYDROMORPHONE HCL 1 MG/ML IJ SOLN
0.2500 mg | INTRAMUSCULAR | Status: DC | PRN
  Administered 2016-02-12 (×2): 0.5 mg via INTRAVENOUS

## 2016-02-12 MED ORDER — PROPOFOL 10 MG/ML IV BOLUS
INTRAVENOUS | Status: AC
  Filled 2016-02-12: qty 20

## 2016-02-12 MED ORDER — PROPOFOL 10 MG/ML IV BOLUS
INTRAVENOUS | Status: AC
  Filled 2016-02-12: qty 60

## 2016-02-12 MED ORDER — 0.9 % SODIUM CHLORIDE (POUR BTL) OPTIME
TOPICAL | Status: DC | PRN
  Administered 2016-02-12: 1000 mL

## 2016-02-12 MED ORDER — DOCUSATE SODIUM 100 MG PO CAPS
100.0000 mg | ORAL_CAPSULE | Freq: Two times a day (BID) | ORAL | Status: DC
  Administered 2016-02-12 – 2016-02-14 (×4): 100 mg via ORAL

## 2016-02-12 MED ORDER — MENTHOL 3 MG MT LOZG: 1.0000 | LOZENGE | OROMUCOSAL | Status: DC | PRN

## 2016-02-12 MED ORDER — ONDANSETRON HCL 4 MG PO TABS: 4.0000 mg | ORAL_TABLET | Freq: Four times a day (QID) | ORAL | Status: DC | PRN

## 2016-02-12 MED ORDER — DEXTROSE 5 % IV SOLN
500.0000 mg | Freq: Four times a day (QID) | INTRAVENOUS | Status: DC | PRN
  Administered 2016-02-12: 500 mg via INTRAVENOUS
  Filled 2016-02-12 (×2): qty 5

## 2016-02-12 MED ORDER — SODIUM CHLORIDE 0.9 % IV SOLN
2000.0000 mg | Freq: Once | INTRAVENOUS | Status: DC
  Filled 2016-02-12: qty 20

## 2016-02-12 MED ORDER — ACETAMINOPHEN 325 MG PO TABS
650.0000 mg | ORAL_TABLET | Freq: Four times a day (QID) | ORAL | Status: DC | PRN
Start: 2016-02-13 — End: 2016-02-14
  Administered 2016-02-14: 650 mg via ORAL
  Filled 2016-02-12: qty 2

## 2016-02-12 MED ORDER — TRAMADOL HCL 50 MG PO TABS: 50.0000 mg | ORAL_TABLET | Freq: Four times a day (QID) | ORAL | Status: DC | PRN

## 2016-02-12 MED ORDER — PHENYLEPHRINE HCL 10 MG/ML IJ SOLN
INTRAMUSCULAR | Status: DC | PRN
  Administered 2016-02-12 (×5): 80 ug via INTRAVENOUS

## 2016-02-12 MED ORDER — CHLORHEXIDINE GLUCONATE 4 % EX LIQD: 60.0000 mL | Freq: Once | CUTANEOUS | Status: DC

## 2016-02-12 MED ORDER — STERILE WATER FOR IRRIGATION IR SOLN
Status: DC | PRN
  Administered 2016-02-12: 2000 mL

## 2016-02-12 MED ORDER — MEPERIDINE HCL 50 MG/ML IJ SOLN: 6.2500 mg | INTRAMUSCULAR | Status: DC | PRN

## 2016-02-12 SURGICAL SUPPLY — 33 items
BAG DECANTER FOR FLEXI CONT (MISCELLANEOUS) ×2 IMPLANT
BLADE SAG 18X100X1.27 (BLADE) ×2 IMPLANT
CAPT HIP TOTAL 2 ×2 IMPLANT
CLOTH BEACON ORANGE TIMEOUT ST (SAFETY) ×2 IMPLANT
COVER PERINEAL POST (MISCELLANEOUS) ×2 IMPLANT
DRAPE STERI IOBAN 125X83 (DRAPES) ×2 IMPLANT
DRAPE U-SHAPE 47X51 STRL (DRAPES) ×4 IMPLANT
DRSG ADAPTIC 3X8 NADH LF (GAUZE/BANDAGES/DRESSINGS) ×2 IMPLANT
DRSG MEPILEX BORDER 4X4 (GAUZE/BANDAGES/DRESSINGS) ×2 IMPLANT
DRSG MEPILEX BORDER 4X8 (GAUZE/BANDAGES/DRESSINGS) ×2 IMPLANT
DURAPREP 26ML APPLICATOR (WOUND CARE) ×2 IMPLANT
ELECT REM PT RETURN 9FT ADLT (ELECTROSURGICAL) ×2
ELECTRODE REM PT RTRN 9FT ADLT (ELECTROSURGICAL) ×1 IMPLANT
EVACUATOR 1/8 PVC DRAIN (DRAIN) ×2 IMPLANT
GLOVE BIO SURGEON STRL SZ7.5 (GLOVE) ×4 IMPLANT
GLOVE BIO SURGEON STRL SZ8 (GLOVE) ×4 IMPLANT
GLOVE BIOGEL PI IND STRL 7.5 (GLOVE) ×2 IMPLANT
GLOVE BIOGEL PI IND STRL 8 (GLOVE) ×2 IMPLANT
GLOVE BIOGEL PI INDICATOR 7.5 (GLOVE) ×2
GLOVE BIOGEL PI INDICATOR 8 (GLOVE) ×2
GLOVE SURG SS PI 7.5 STRL IVOR (GLOVE) ×2 IMPLANT
GOWN STRL REUS W/TWL LRG LVL3 (GOWN DISPOSABLE) ×4 IMPLANT
GOWN STRL REUS W/TWL XL LVL3 (GOWN DISPOSABLE) ×4 IMPLANT
PACK ANTERIOR HIP CUSTOM (KITS) ×2 IMPLANT
STRIP CLOSURE SKIN 1/2X4 (GAUZE/BANDAGES/DRESSINGS) ×2 IMPLANT
SUT ETHIBOND NAB CT1 #1 30IN (SUTURE) ×2 IMPLANT
SUT MNCRL AB 4-0 PS2 18 (SUTURE) ×2 IMPLANT
SUT VIC AB 2-0 CT1 27 (SUTURE) ×2
SUT VIC AB 2-0 CT1 TAPERPNT 27 (SUTURE) ×2 IMPLANT
SUT VLOC 180 0 24IN GS25 (SUTURE) ×2 IMPLANT
SYR 50ML LL SCALE MARK (SYRINGE) ×2 IMPLANT
TRAY FOLEY W/METER SILVER 14FR (SET/KITS/TRAYS/PACK) ×2 IMPLANT
YANKAUER SUCT BULB TIP 10FT TU (MISCELLANEOUS) ×2 IMPLANT

## 2016-02-12 NOTE — Progress Notes (Signed)
Utilization review completed.  

## 2016-02-12 NOTE — Interval H&P Note (Signed)
History and Physical Interval Note:  02/12/2016 1:32 PM  Lori Frost  has presented today for surgery, with the diagnosis of right hip osteoarthritis  The various methods of treatment have been discussed with the patient and family. After consideration of risks, benefits and other options for treatment, the patient has consented to  Procedure(s): RIGHT TOTAL HIP ARTHROPLASTY ANTERIOR APPROACH (Right) as a surgical intervention .  The patient's history has been reviewed, patient examined, no change in status, stable for surgery.  I have reviewed the patient's chart and labs.  Questions were answered to the patient's satisfaction.     Loanne Drilling

## 2016-02-12 NOTE — Anesthesia Procedure Notes (Signed)
Spinal Patient location during procedure: OR Start time: 02/12/2016 1:30 PM End time: 02/12/2016 1:46 PM Staffing Resident/CRNA: Theda Payer Performed by: resident/CRNA  Preanesthetic Checklist Completed: patient identified, site marked, surgical consent, pre-op evaluation, timeout performed, IV checked, risks and benefits discussed and monitors and equipment checked Spinal Block Patient position: sitting Prep: Betadine Patient monitoring: heart rate, cardiac monitor, continuous pulse ox and blood pressure Location: L3-4 Injection technique: single-shot Needle Needle type: Spinocan and Sprotte  Needle gauge: 24 G Needle length: 10 cm Needle insertion depth: 6 cm Assessment Sensory level: T6 Additional Notes -heme, - para, VSS.

## 2016-02-12 NOTE — Transfer of Care (Signed)
Immediate Anesthesia Transfer of Care Note  Patient: Lori Frost  Procedure(s) Performed: Procedure(s): RIGHT TOTAL HIP ARTHROPLASTY ANTERIOR APPROACH (Right)  Patient Location: PACU  Anesthesia Type:Spinal  Level of Consciousness:  sedated, patient cooperative and responds to stimulation  Airway & Oxygen Therapy:Patient Spontanous Breathing and Patient connected to face mask oxgen  Post-op Assessment:  Report given to PACU RN and Post -op Vital signs reviewed and stable  Post vital signs:  Reviewed and stable  Last Vitals:  Filed Vitals:   02/12/16 1130  BP: 151/79  Pulse: 91  Temp: 36.6 C  Resp: 18    Complications: No apparent anesthesia complications

## 2016-02-12 NOTE — Anesthesia Postprocedure Evaluation (Signed)
Anesthesia Post Note  Patient: Lori Frost  Procedure(s) Performed: Procedure(s) (LRB): RIGHT TOTAL HIP ARTHROPLASTY ANTERIOR APPROACH (Right)  Patient location during evaluation: PACU Anesthesia Type: Spinal Level of consciousness: awake and alert, oriented and patient cooperative Pain management: pain level controlled Vital Signs Assessment: post-procedure vital signs reviewed and stable Respiratory status: spontaneous breathing, nonlabored ventilation and respiratory function stable Cardiovascular status: blood pressure returned to baseline and stable Postop Assessment: no headache, no backache, spinal receding, patient able to bend at knees and no signs of nausea or vomiting Anesthetic complications: no    Last Vitals:  Filed Vitals:   02/12/16 1711 02/12/16 1814  BP: 135/72 152/68  Pulse: 89 91  Temp: 36.4 C 36.9 C  Resp: 16 16    Last Pain:  Filed Vitals:   02/12/16 1859  PainSc: 4                  Eleny Cortez,Lori Frost

## 2016-02-12 NOTE — Op Note (Signed)
OPERATIVE REPORT  PREOPERATIVE DIAGNOSIS: Osteoarthritis of the Right hip.   POSTOPERATIVE DIAGNOSIS: Osteoarthritis of the Right  hip.   PROCEDURE: Right total hip arthroplasty, anterior approach.   SURGEON: Ollen Gross, MD   ASSISTANT: Avel Peace, PA-C  ANESTHESIA:  Spinal  ESTIMATED BLOOD LOSS:-250 ml   DRAINS: Hemovac x1.   COMPLICATIONS: None   CONDITION: PACU - hemodynamically stable.   BRIEF CLINICAL NOTE: Lori Frost is a 70 y.o. female who has advanced end-  stage arthritis of her Right  hip with progressively worsening pain and  dysfunction.The patient has failed nonoperative management and presents for  total hip arthroplasty.   PROCEDURE IN DETAIL: After successful administration of spinal  anesthetic, the traction boots for the [facility] bed were placed on both  feet and the patient was placed onto the [facility] bed, boots placed into the leg  holders. The Right hip was then isolated from the perineum with plastic  drapes and prepped and draped in the usual sterile fashion. ASIS and  greater trochanter were marked and a oblique incision was made, starting  at about 1 cm lateral and 2 cm distal to the ASIS and coursing towards  the anterior cortex of the femur. The skin was cut with a 10 blade  through subcutaneous tissue to the level of the fascia overlying the  tensor fascia lata muscle. The fascia was then incised in line with the  incision at the junction of the anterior third and posterior 2/3rd. The  muscle was teased off the fascia and then the interval between the TFL  and the rectus was developed. The Hohmann retractor was then placed at  the top of the femoral neck over the capsule. The vessels overlying the  capsule were cauterized and the fat on top of the capsule was removed.  A Hohmann retractor was then placed anterior underneath the rectus  femoris to give exposure to the entire anterior capsule. A T-shaped  capsulotomy was performed.  The edges were tagged and the femoral head  was identified.       Osteophytes are removed off the superior acetabulum.  The femoral neck was then cut in situ with an oscillating saw. Traction  was then applied to the left lower extremity utilizing the [facility]  traction. The femoral head was then removed. Retractors were placed  around the acetabulum and then circumferential removal of the labrum was  performed. Osteophytes were also removed. Reaming starts at 45 mm to  medialize and  Increased in 2 mm increments to 49 mm. We reamed in  approximately 40 degrees of abduction, 20 degrees anteversion. A 50 mm  pinnacle acetabular shell was then impacted in anatomic position under  fluoroscopic guidance with excellent purchase. We did not need to place  any additional dome screws. A 32 mm neutral + 4 marathon liner was then  placed into the acetabular shell.       The femoral lift was then placed along the lateral aspect of the femur  just distal to the vastus ridge. The leg was  externally rotated and capsule  was stripped off the inferior aspect of the femoral neck down to the  level of the lesser trochanter, this was done with electrocautery. The femur was lifted after this was performed. The  leg was then placed and extended in adducted position to essentially delivering the femur. We also removed the capsule superiorly and the  piriformis from the piriformis  fossa to gain excellent exposure of the  proximal femur. Rongeur was used to remove some cancellous bone to get  into the lateral portion of the proximal femur for placement of the  initial starter reamer. The starter broaches was placed  the starter broach  and was shown to go down the center of the canal. Broaching  with the  Corail system was then performed starting at size 8, coursing  Up to size 10. A size 10 had excellent torsional and rotational  and axial stability. The trial standard offset neck was then placed  with a 32 + 5  trial head. The hip was then reduced. We confirmed that  the stem was in the canal both on AP and lateral x-rays. It also has excellent sizing. The hip was reduced with outstanding stability through full extension, full external rotation,  and then flexion in adduction internal rotation. AP pelvis was taken  and the leg lengths were measured and found to be exactly equal. Hip  was then dislocated again and the femoral head and neck removed. The  femoral broach was removed. Size 10 Corail stem with a standard offset  neck was then impacted into the femur following native anteversion. Has  excellent purchase in the canal. Excellent torsional and rotational and  axial stability. It is confirmed to be in the canal on AP and lateral  fluoroscopic views. The 32 + 5 ceramic head was placed and the hip  reduced with outstanding stability. Again AP pelvis was taken and it  confirmed that the leg lengths were equal. The wound was then copiously  irrigated with saline solution and the capsule reattached and repaired  with Ethibond suture. 30 ml of .25% Bupivicaine injected into the capsule and into the edge of the tensor fascia lata as well as subcutaneous tissue. The fascia overlying the tensor fascia lata was  then closed with a running #1 V-Loc. Subcu was closed with interrupted  2-0 Vicryl and subcuticular running 4-0 Monocryl. Incision was cleaned  and dried. Steri-Strips and a bulky sterile dressing applied. Hemovac  drain was hooked to suction and then he was awakened and transported to  recovery in stable condition.        Please note that a surgical assistant was a medical necessity for this procedure to perform it in a safe and expeditious manner. Assistant was necessary to provide appropriate retraction of vital neurovascular structures and to prevent femoral fracture and allow for anatomic placement of the prosthesis.  Ollen Gross, M.D.

## 2016-02-12 NOTE — Anesthesia Preprocedure Evaluation (Addendum)
Anesthesia Evaluation  Patient identified by MRN, date of birth, ID band Patient awake    Reviewed: Allergy & Precautions, NPO status , Patient's Chart, lab work & pertinent test results  History of Anesthesia Complications Negative for: history of anesthetic complications  Airway Mallampati: I  TM Distance: >3 FB Neck ROM: Full    Dental  (+) Caps, Dental Advisory Given, Implants   Pulmonary neg pulmonary ROS,    breath sounds clear to auscultation       Cardiovascular hypertension, Pt. on medications (-) angina+ Peripheral Vascular Disease (carotid stenosis)  + Valvular Problems/Murmurs (MVP with mild-mod MR) MR and MVP  Rhythm:Regular Rate:Normal + Systolic murmurs 1/61/09 ECHO: EF 60%, normal LVF, mod prolapse of mitral leaflets with partially flail posterior leaflet, mod MR, mild AI, mild TR   Neuro/Psych  Headaches (migraines), meniere's    GI/Hepatic Neg liver ROS, GERD  Medicated and Controlled,  Endo/Other  negative endocrine ROS  Renal/GU negative Renal ROS     Musculoskeletal  (+) Arthritis , Osteoarthritis,    Abdominal   Peds  Hematology negative hematology ROS (+)   Anesthesia Other Findings   Reproductive/Obstetrics                          Anesthesia Physical Anesthesia Plan  ASA: III  Anesthesia Plan: Spinal   Post-op Pain Management:    Induction:   Airway Management Planned: Natural Airway and Simple Face Mask  Additional Equipment:   Intra-op Plan:   Post-operative Plan:   Informed Consent: I have reviewed the patients History and Physical, chart, labs and discussed the procedure including the risks, benefits and alternatives for the proposed anesthesia with the patient or authorized representative who has indicated his/her understanding and acceptance.   Dental advisory given  Plan Discussed with: CRNA and Surgeon  Anesthesia Plan Comments: (Plan routine  monitors, SAB)        Anesthesia Quick Evaluation

## 2016-02-13 ENCOUNTER — Encounter (HOSPITAL_COMMUNITY): Payer: Self-pay | Admitting: Orthopedic Surgery

## 2016-02-13 LAB — BASIC METABOLIC PANEL
Anion gap: 7 (ref 5–15)
BUN: 16 mg/dL (ref 6–20)
CALCIUM: 8.5 mg/dL — AB (ref 8.9–10.3)
CO2: 28 mmol/L (ref 22–32)
CREATININE: 0.78 mg/dL (ref 0.44–1.00)
Chloride: 106 mmol/L (ref 101–111)
GFR calc Af Amer: >60 mL/min (ref >60)
GLUCOSE: 153 mg/dL — AB (ref 65–99)
Potassium: 4.3 mmol/L (ref 3.5–5.1)
Sodium: 141 mmol/L (ref 135–145)

## 2016-02-13 LAB — CBC
HCT: 31.5 % — ABNORMAL LOW (ref 36.0–46.0)
Hemoglobin: 10.5 g/dL — ABNORMAL LOW (ref 12.0–15.0)
MCH: 29.8 pg (ref 26.0–34.0)
MCHC: 33.3 g/dL (ref 30.0–36.0)
MCV: 89.5 fL (ref 78.0–100.0)
PLATELETS: 220 10*3/uL (ref 150–400)
RBC: 3.52 MIL/uL — ABNORMAL LOW (ref 3.87–5.11)
RDW: 14.2 % (ref 11.5–15.5)
WBC: 11.7 10*3/uL — AB (ref 4.0–10.5)

## 2016-02-13 NOTE — Clinical Social Work Placement (Signed)
   CLINICAL SOCIAL WORK PLACEMENT  NOTE  Date:  02/13/2016  Patient Details  Name: Lori Frost MRN: [id number] Date of Birth: 05/20/1946  Clinical Social Work is seeking post-discharge placement for this patient at the Skilled  Nursing Facility level of care (*CSW will initial, date and re-position this form in  chart as items are completed):  No   Patient/family provided with [facility] Clinical Social Work Department's list of facilities offering this level of care within the geographic area requested by the patient (or if unable, by the patient's family).  Yes   Patient/family informed of their freedom to choose among providers that offer the needed level of care, that participate in Medicare, Medicaid or managed care program needed by the patient, have an available bed and are willing to accept the patient.  No   Patient/family informed of [facility]'s ownership interest in [facility] and [facility], as well as of the fact that they are under no obligation to receive care at these facilities.  PASRR submitted to EDS on 02/12/16     PASRR number received on 02/12/16     Existing PASRR number confirmed on       FL2 transmitted to all facilities in geographic area requested by pt/family on 02/13/16     FL2 transmitted to all facilities within larger geographic area on       Patient informed that his/her managed care company has contracts with or will negotiate with certain facilities, including the following:        Yes   Patient/family informed of bed offers received.  Patient [facility] bed at [facility]     Physician recommends and patient chooses bed at [facility]    Patient to be transferred to   on  .  Patient to be transferred to facility by       Patient family notified on   of transfer.  Name of family member notified:        PHYSICIAN       Additional Comment:    _______________________________________________ Royetta Asal, LCSW   [phone] 02/13/2016, 9:11 AM

## 2016-02-13 NOTE — Discharge Instructions (Addendum)
° °Dr. Frank Aluisio °Total Joint Specialist °[facility] °[address] °[place], [place] [address] °[phone] ° °ANTERIOR APPROACH TOTAL HIP REPLACEMENT POSTOPERATIVE DIRECTIONS ° ° °Hip Rehabilitation, Guidelines Following Surgery  °The results of a hip operation are greatly improved after range of motion and muscle strengthening exercises. Follow all safety measures which are given to protect your hip. If any of these exercises cause increased pain or swelling in your joint, decrease the amount until you are comfortable again. Then slowly increase the exercises. Call your caregiver if you have problems or questions.  ° °HOME CARE INSTRUCTIONS  °Remove items at home which could result in a fall. This includes throw rugs or furniture in walking pathways.  °· ICE to the affected hip every three hours for 30 minutes at a time and then as needed for pain and swelling.  Continue to use ice on the hip for pain and swelling from surgery. You may notice swelling that will progress down to the foot and ankle.  This is normal after surgery.  Elevate the leg when you are not up walking on it.   °· Continue to use the breathing machine which will help keep your temperature down.  It is common for your temperature to cycle up and down following surgery, especially at night when you are not up moving around and exerting yourself.  The breathing machine keeps your lungs expanded and your temperature down. ° ° °DIET °You may resume your previous home diet once your are discharged from the hospital. ° °DRESSING / WOUND CARE / SHOWERING °You may shower 3 days after surgery, but keep the wounds dry during showering.  You may use an occlusive plastic wrap (Press'n Seal for example), NO SOAKING/SUBMERGING IN THE BATHTUB.  If the bandage gets wet, change with a clean dry gauze.  If the incision gets wet, pat the wound dry with a clean towel. °You may start showering once you are discharged home but do not  submerge the incision under water. Just pat the incision dry and apply a dry gauze dressing on daily. °Change the surgical dressing daily and reapply a dry dressing each time. ° °ACTIVITY °Walk with your walker as instructed. °Use walker as long as suggested by your caregivers. °Avoid periods of inactivity such as sitting longer than an hour when not asleep. This helps prevent blood clots.  °You may resume a sexual relationship in one month or when given the OK by your doctor.  °You may return to work once you are cleared by your doctor.  °Do not drive a car for 6 weeks or until released by you surgeon.  °Do not drive while taking narcotics. ° °WEIGHT BEARING °Weight bearing as tolerated with assist device (walker, cane, etc) as directed, use it as long as suggested by your surgeon or therapist, typically at least 4-6 weeks. ° °POSTOPERATIVE CONSTIPATION PROTOCOL °Constipation - defined medically as fewer than three stools per week and severe constipation as less than one stool per week. ° °One of the most common issues patients have following surgery is constipation.  Even if you have a regular bowel pattern at home, your normal regimen is likely to be disrupted due to multiple reasons following surgery.  Combination of anesthesia, postoperative narcotics, change in appetite and fluid intake all can affect your bowels.  In order to avoid complications following surgery, here are some recommendations in order to help you during your recovery period. ° °Colace (docusate) - Pick up an over-the-counter   form of Colace or another stool softener and take twice a day as long as you are requiring postoperative pain medications.  Take with a full glass of water daily.  If you experience loose stools or diarrhea, hold the colace until you stool forms back up.  If your symptoms do not get better within 1 week or if they get worse, check with your doctor. ° °Dulcolax (bisacodyl) - Pick up over-the-counter and take as directed  by the product packaging as needed to assist with the movement of your bowels.  Take with a full glass of water.  Use this product as needed if not relieved by Colace only.  ° °MiraLax (polyethylene glycol) - Pick up over-the-counter to have on hand.  MiraLax is a solution that will increase the amount of water in your bowels to assist with bowel movements.  Take as directed and can mix with a glass of water, juice, soda, coffee, or tea.  Take if you go more than two days without a movement. °Do not use MiraLax more than once per day. Call your doctor if you are still constipated or irregular after using this medication for 7 days in a row. ° °If you continue to have problems with postoperative constipation, please contact the office for further assistance and recommendations.  If you experience "the worst abdominal pain ever" or develop nausea or vomiting, please contact the office immediatly for further recommendations for treatment. ° °ITCHING ° If you experience itching with your medications, try taking only a single pain pill, or even half a pain pill at a time.  You can also use Benadryl over the counter for itching or also to help with sleep.  ° °TED HOSE STOCKINGS °Wear the elastic stockings on both legs for three weeks following surgery during the day but you may remove then at night for sleeping. ° °MEDICATIONS °See your medication summary on the “After Visit Summary” that the nursing staff will review with you prior to discharge.  You may have some home medications which will be placed on hold until you complete the course of blood thinner medication.  It is important for you to complete the blood thinner medication as prescribed by your surgeon.  Continue your approved medications as instructed at time of discharge. ° °PRECAUTIONS °If you experience chest pain or shortness of breath - call 911 immediately for transfer to the hospital emergency department.  °If you develop a fever greater that 101 F,  purulent drainage from wound, increased redness or drainage from wound, foul odor from the wound/dressing, or calf pain - CONTACT YOUR SURGEON.   °                                                °FOLLOW-UP APPOINTMENTS °Make sure you keep all of your appointments after your operation with your surgeon and caregivers. You should call the office at the above phone number and make an appointment for approximately two weeks after the date of your surgery or on the date instructed by your surgeon outlined in the "After Visit Summary". ° °RANGE OF MOTION AND STRENGTHENING EXERCISES  °These exercises are designed to help you keep full movement of your hip joint. Follow your caregiver's or physical therapist's instructions. Perform all exercises about fifteen times, three times per day or as directed. Exercise both hips, even if you   have had only one joint replacement. These exercises can be done on a training (exercise) mat, on the floor, on a table or on a bed. Use whatever works the best and is most comfortable for you. Use music or television while you are exercising so that the exercises are a pleasant break in your day. This will make your life better with the exercises acting as a break in routine you can look forward to.  °Lying on your back, slowly slide your foot toward your buttocks, raising your knee up off the floor. Then slowly slide your foot back down until your leg is straight again.  °Lying on your back spread your legs as far apart as you can without causing discomfort.  °Lying on your side, raise your upper leg and foot straight up from the floor as far as is comfortable. Slowly lower the leg and repeat.  °Lying on your back, tighten up the muscle in the front of your thigh (quadriceps muscles). You can do this by keeping your leg straight and trying to raise your heel off the floor. This helps strengthen the largest muscle supporting your knee.  °Lying on your back, tighten up the muscles of your  buttocks both with the legs straight and with the knee bent at a comfortable angle while keeping your heel on the floor.  ° °IF YOU ARE TRANSFERRED TO A SKILLED REHAB FACILITY °If the patient is transferred to a skilled rehab facility following release from the hospital, a list of the current medications will be sent to the facility for the patient to continue.  When discharged from the skilled rehab facility, please have the facility set up the patient's Home Health Physical Therapy prior to being released. Also, the skilled facility will be responsible for providing the patient with their medications at time of release from the facility to include their pain medication, the muscle relaxants, and their blood thinner medication. If the patient is still at the rehab facility at time of the two week follow up appointment, the skilled rehab facility will also need to assist the patient in arranging follow up appointment in our office and any transportation needs. ° °MAKE SURE YOU:  °Understand these instructions.  °Get help right away if you are not doing well or get worse.  ° ° °Pick up stool softner and laxative for home use following surgery while on pain medications. °Do not submerge incision under water. °Please use good hand washing techniques while changing dressing each day. °May shower starting three days after surgery. °Please use a clean towel to pat the incision dry following showers. °Continue to use ice for pain and swelling after surgery. °Do not use any lotions or creams on the incision until instructed by your surgeon. ° °Take Xarelto for two and a half more weeks, then discontinue Xarelto. °Once the patient has completed the Xarelto, they may resume the 81 mg Aspirin. ° °Information on my medicine - XARELTO® (Rivaroxaban) ° °This medication education was reviewed with me or my healthcare representative as part of my discharge preparation.  The pharmacist that spoke with me during my hospital stay was:   Zeigler, Dustin George, RPH ° °Why was Xarelto® prescribed for you? °Xarelto® was prescribed for you to reduce the risk of blood clots forming after orthopedic surgery. The medical term for these abnormal blood clots is venous thromboembolism (VTE). ° °What do you need to know about xarelto® ? °Take your Xarelto® ONCE DAILY at the same time every day. °  You may take it either with or without food. ° °If you have difficulty swallowing the tablet whole, you may crush it and mix in applesauce just prior to taking your dose. ° °Take Xarelto® exactly as prescribed by your doctor and DO NOT stop taking Xarelto® without talking to the doctor who prescribed the medication.  Stopping without other VTE prevention medication to take the place of Xarelto® may increase your risk of developing a clot. ° °After discharge, you should have regular check-up appointments with your healthcare provider that is prescribing your Xarelto®.   ° °What do you do if you miss a dose? °If you miss a dose, take it as soon as you remember on the same day then continue your regularly scheduled once daily regimen the next day. Do not take two doses of Xarelto® on the same day.  ° °Important Safety Information °A possible side effect of Xarelto® is bleeding. You should call your healthcare provider right away if you experience any of the following: °? Bleeding from an injury or your nose that does not stop. °? Unusual colored urine (red or dark brown) or unusual colored stools (red or black). °? Unusual bruising for unknown reasons. °? A serious fall or if you hit your head (even if there is no bleeding). ° °Some medicines may interact with Xarelto® and might increase your risk of bleeding while on Xarelto®. To help avoid this, consult your healthcare provider or pharmacist prior to using any new prescription or non-prescription medications, including herbals, vitamins, non-steroidal anti-inflammatory drugs (NSAIDs) and supplements. ° °This website  has more information on Xarelto®: www.xarelto.com. ° ° ° ° °

## 2016-02-13 NOTE — Evaluation (Signed)
Physical Therapy Evaluation Patient Details Name: Lori Frost MRN: [id number] DOB: November 13, 1946 Today's Date: 02/13/2016   History of Present Illness  s/p R DA THA.  H/o chronic vestibular neuritis and Meniere's disease  Clinical Impression  Pt s/p R THR presents with decreased R LE strength/ROM and post op pain limiting functional mobility.  Pt would benefit from follow up rehab at SNF level to maximize IND and safety prior to return home alone.    Follow Up Recommendations SNF    Equipment Recommendations  None recommended by PT    Recommendations for Other Services OT consult     Precautions / Restrictions Precautions Precautions: Fall Precaution Comments: Hx of vertigo Restrictions Weight Bearing Restrictions: No (Simultaneous filing. User may not have seen previous data.)      Mobility  Bed Mobility Overal bed mobility: Needs Assistance Bed Mobility: Supine to Sit     Supine to sit: Min assist;Mod assist     General bed mobility comments: cues for sequence and use of R LE to self assist  Transfers Overall transfer level: Needs assistance Equipment used: Rolling walker (2 wheeled) Transfers: Sit to/from Stand Sit to Stand: Min assist         General transfer comment: assist to rise and steady,. cues for UE/LE placement  Ambulation/Gait Ambulation/Gait assistance: Min assist Ambulation Distance (Feet): 32 Feet Assistive device: Rolling walker (2 wheeled) Gait Pattern/deviations: Step-to pattern;Decreased step length - right;Decreased step length - left;Shuffle;Trunk flexed Gait velocity: decr   General Gait Details: cues for sequence, posture, position from RW and stride length  Stairs            Wheelchair Mobility    Modified Rankin (Stroke Patients Only)       Balance                                             Pertinent Vitals/Pain Pain Assessment: 0-10 Pain Score: 4  Pain Location: R hip Pain Descriptors /  Indicators: Aching;Sore Pain Intervention(s): Limited activity within patient's tolerance;Monitored during session;Premedicated before session;Ice applied    Home Living Family/patient expects to be discharged to:: Skilled nursing facility Living Arrangements: Alone                    Prior Function Level of Independence: Independent               Hand Dominance        Extremity/Trunk Assessment   Upper Extremity Assessment: Overall WFL for tasks assessed           Lower Extremity Assessment: RLE deficits/detail RLE Deficits / Details: Strength at hip 2+/5 with AAROM at hip to 90 flex and 15 abd    Cervical / Trunk Assessment: Normal  Communication   Communication: No difficulties  Cognition Arousal/Alertness: Awake/alert Behavior During Therapy: WFL for tasks assessed/performed Overall Cognitive Status: Within Functional Limits for tasks assessed                      General Comments      Exercises Total Joint Exercises Ankle Circles/Pumps: AROM;15 reps;Supine;Both Quad Sets: AROM;Both;10 reps;Supine Heel Slides: AROM;Both;20 reps;Supine Hip ABduction/ADduction: AAROM;Right;15 reps;Supine      Assessment/Plan    PT Assessment Patient needs continued PT services  PT Diagnosis Difficulty walking   PT Problem List Decreased strength;Decreased range of motion;Decreased activity  tolerance;Decreased mobility;Pain;Decreased knowledge of use of DME  PT Treatment Interventions DME instruction;Gait training;Stair training;Functional mobility training;Therapeutic activities;Therapeutic exercise;Patient/family education   PT Goals (Current goals can be found in the Care Plan section) Acute Rehab PT Goals Patient Stated Goal: rehab then home PT Goal Formulation: With patient Time For Goal Achievement: 02/18/16 Potential to Achieve Goals: Good    Frequency 7X/week   Barriers to discharge        Co-evaluation               End of  Session Equipment Utilized During Treatment: Gait belt Activity Tolerance: Patient tolerated treatment well Patient left: in chair;with call bell/phone within reach;with family/visitor present Nurse Communication: Mobility status         Time: 6962-9528 PT Time Calculation (min) (ACUTE ONLY): 32 min   Charges:   PT Evaluation $PT Eval Low Complexity: 1 Procedure PT Treatments $Therapeutic Exercise: 8-22 mins   PT G Codes:        Racine Erby 2016-02-25, 1:22 PM

## 2016-02-13 NOTE — Care Management Note (Signed)
Case Management Note  Patient Details  Name: Lori Frost MRN: [id number] Date of Birth: 22-Jan-1946  Subjective/Objective:    S/P Right total hip arthroplasty, anterior approach                Action/Plan: Discharge planning per CSW  Expected Discharge Date:                  Expected Discharge Plan:  Skilled Nursing Facility  In-House Referral:  Clinical Social Work  Discharge planning Services  CM Consult  Post Acute Care Choice:  NA Choice offered to:  NA  DME Arranged:  N/A DME Agency:  NA  HH Arranged:  NA HH Agency:  NA  Status of Service:  Completed, signed off  Medicare Important Message Given:    Date Medicare IM Given:    Medicare IM give by:    Date Additional Medicare IM Given:    Additional Medicare Important Message give by:     If discussed at Long Length of Stay Meetings, dates discussed:    Additional Comments:  Alexis Goodell, RN 02/13/2016, 12:16 PM [phone]

## 2016-02-13 NOTE — Progress Notes (Signed)
Physical Therapy Treatment Patient Details Name: MARTHELLA OSORNO MRN: [id number] DOB: 1946-05-16 Today's Date: 02/16/2016    History of Present Illness s/p R DA THA.  H/o chronic vestibular neuritis and Meniere's disease    PT Comments    Pt very cooperative and progressing with mobility but continues to express concern re possible leg length discrepancy.  Follow Up Recommendations  SNF     Equipment Recommendations  None recommended by PT    Recommendations for Other Services OT consult     Precautions / Restrictions Precautions Precautions: Fall Precaution Comments: Hx of vertigo Restrictions Weight Bearing Restrictions: No Other Position/Activity Restrictions: WBAT    Mobility  Bed Mobility               General bed mobility comments: Pt OOB and declines back to bed  Transfers Overall transfer level: Needs assistance Equipment used: Rolling walker (2 wheeled) Transfers: Sit to/from Stand Sit to Stand: Min assist         General transfer comment: assist to rise and steady,. cues for UE/LE placement  Ambulation/Gait Ambulation/Gait assistance: Min assist Ambulation Distance (Feet): 55 Feet Assistive device: Rolling walker (2 wheeled) Gait Pattern/deviations: Step-to pattern;Decreased step length - right;Decreased step length - left;Shuffle;Trunk flexed Gait velocity: decr   General Gait Details: cues for sequence, posture, position from RW and stride length.  Pt with difficulty advancing R LE and with mild vaulting over R to advance L - partially corrected with low profile shoe on L.   Stairs            Wheelchair Mobility    Modified Rankin (Stroke Patients Only)       Balance                                    Cognition Arousal/Alertness: Awake/alert Behavior During Therapy: WFL for tasks assessed/performed Overall Cognitive Status: Within Functional Limits for tasks assessed                      Exercises       General Comments        Pertinent Vitals/Pain Pain Assessment: 0-10 Pain Score: 4  Pain Location: r HIP Pain Descriptors / Indicators: Aching;Sore Pain Intervention(s): Limited activity within patient's tolerance;Monitored during session;Premedicated before session;Ice applied    Home Living                      Prior Function            PT Goals (current goals can now be found in the care plan section) Acute Rehab PT Goals Patient Stated Goal: rehab then home PT Goal Formulation: With patient Time For Goal Achievement: 02/18/16 Potential to Achieve Goals: Good Progress towards PT goals: Progressing toward goals    Frequency  7X/week    PT Plan Current plan remains appropriate    Co-evaluation             End of Session Equipment Utilized During Treatment: Gait belt Activity Tolerance: Patient tolerated treatment well Patient left: in chair;with call bell/phone within reach;with chair alarm set     Time: 4098-1191 PT Time Calculation (min) (ACUTE ONLY): 22 min  Charges:  $Gait Training: 8-22 mins                    G Codes:      Haille Pardi 2016-02-16, 4:59  PM   

## 2016-02-13 NOTE — Evaluation (Signed)
Occupational Therapy Evaluation Patient Details Name: Lori Frost MRN: [id number] DOB: 11-10-1946 Today's Date: 02/13/2016    History of Present Illness s/p R DA THA.  H/o chronic vestibular neuritis and Meniere's disease   Clinical Impression   This 70 year old female was admitted for the above surgery.  At time of evaluation, she reports ear fullness and she has significant vestibular history.  No dizziness at this time.  Pt currently needs up to mod A for LB adls. She will benefit from skilled OT to increase safety and independence.  Goals in acute are for min guard level    Follow Up Recommendations  SNF    Equipment Recommendations  3 in 1 bedside comode    Recommendations for Other Services       Precautions / Restrictions Precautions Precautions: Fall Restrictions Weight Bearing Restrictions: No      Mobility Bed Mobility Overal bed mobility: Needs Assistance Bed Mobility: Supine to Sit     Supine to sit: Min assist     General bed mobility comments: assist for RLE; cues for sequence  Transfers Overall transfer level: Needs assistance Equipment used: Rolling walker (2 wheeled) Transfers: Sit to/from Stand Sit to Stand: Min assist         General transfer comment: assist to rise and steady,. cues for UE/LE placement    Balance                                            ADL Overall ADL's : Needs assistance/impaired     Grooming: Set up;Sitting   Upper Body Bathing: Set up;Standing   Lower Body Bathing: Moderate assistance;Sit to/from stand   Upper Body Dressing : Set up;Sitting   Lower Body Dressing: Moderate assistance;Sit to/from stand                 General ADL Comments: performed ADL from EOB.  Pt has catheter and did not want to get up to chair yet.  Sidestepped with min guard once standing.  Educated on AE but did not use this session     Vision     Perception     Praxis      Pertinent  Vitals/Pain Pain Assessment: Faces Faces Pain Scale: Hurts little more Pain Location: R hip with movement Pain Descriptors / Indicators: Burning;Aching Pain Intervention(s): Limited activity within patient's tolerance;Monitored during session;Premedicated before session;Repositioned;Ice applied     Hand Dominance     Extremity/Trunk Assessment Upper Extremity Assessment Upper Extremity Assessment: Overall WFL for tasks assessed           Communication Communication Communication: No difficulties   Cognition Arousal/Alertness: Awake/alert Behavior During Therapy: WFL for tasks assessed/performed Overall Cognitive Status: Within Functional Limits for tasks assessed                     General Comments       Exercises       Shoulder Instructions      Home Living Family/patient expects to be discharged to:: Skilled nursing facility Living Arrangements: Alone                                      Prior Functioning/Environment Level of Independence: Independent  OT Diagnosis: Acute pain   OT Problem List: Decreased strength;Decreased activity tolerance;Decreased knowledge of use of DME or AE;Pain   OT Treatment/Interventions: Self-care/ADL training;DME and/or AE instruction;Patient/family education    OT Goals(Current goals can be found in the care plan section) Acute Rehab OT Goals Patient Stated Goal: rehab then home OT Goal Formulation: With patient Time For Goal Achievement: 02/20/16 Potential to Achieve Goals: Good ADL Goals Pt Will Perform Grooming: with min guard assist;standing Pt Will Perform Lower Body Bathing: with min guard assist;sit to/from stand;with adaptive equipment Pt Will Perform Lower Body Dressing: with min guard assist;with adaptive equipment;sit to/from stand Pt Will Transfer to Toilet: with min guard assist;ambulating;bedside commode Pt Will Perform Toileting - Clothing Manipulation and hygiene: with  min guard assist;sit to/from stand  OT Frequency: Min 2X/week   Barriers to D/C:            Co-evaluation              End of Session    Activity Tolerance: Patient tolerated treatment well Patient left: in bed;with call bell/phone within reach;with bed alarm set   Time: 1610-9604 OT Time Calculation (min): 29 min Charges:  OT General Charges $OT Visit: 1 Procedure OT Evaluation $OT Eval Low Complexity: 1 Procedure OT Treatments $Self Care/Home Management : 8-22 mins G-Codes:    Robyn Nohr 2016-02-19, 9:04 AM Marica Otter, OTR/L [phone] 19-Feb-2016

## 2016-02-13 NOTE — Progress Notes (Signed)
   Subjective: 1 Day Post-Op Procedure(s) (LRB): RIGHT TOTAL HIP ARTHROPLASTY ANTERIOR APPROACH (Right) Patient reports pain as mild.   Patient seen in rounds with Dr. Lequita Halt.  Family in room.  Sitting up in bed. Patient is well, but has had some minor complaints of pain in the hip, requiring pain medications We will start therapy today.  Plan is to go to [facility] after hospital stay.  Objective: Vital signs in last 24 hours: Temp:  [94.5 F (34.7 C)-98.8 F (37.1 C)] 98.8 F (37.1 C) (03/02 0613) Pulse Rate:  [80-97] 86 (03/02 0613) Resp:  [10-18] 16 (03/02 0613) BP: (110-152)/(56-79) 114/62 mmHg (03/02 0613) SpO2:  [97 %-100 %] 97 % (03/02 0613) Weight:  [54.432 kg (120 lb)] 54.432 kg (120 lb) (03/01 1225)  Intake/Output from previous day:  Intake/Output Summary (Last 24 hours) at 02/13/16 0905 Last data filed at 02/13/16 0858  Gross per 24 hour  Intake 4076.25 ml  Output   2570 ml  Net 1506.25 ml    Intake/Output this shift: Total I/O In: 240 [P.O.:240] Out: -   Labs:  Recent Labs  02/13/16 0408  HGB 10.5*    Recent Labs  02/13/16 0408  WBC 11.7*  RBC 3.52*  HCT 31.5*  PLT 220    Recent Labs  02/12/16 1200 02/13/16 0408  NA  --  141  K 3.9 4.3  CL  --  106  CO2  --  28  BUN  --  16  CREATININE  --  0.78  GLUCOSE  --  153*  CALCIUM  --  8.5*   No results for input(s): LABPT, INR in the last 72 hours.  EXAM General - Patient is Alert, Appropriate and Oriented Extremity - Neurovascular intact Sensation intact distally Dorsiflexion/Plantar flexion intact Dressing - dressing C/D/I Motor Function - intact, moving foot and toes well on exam.  Hemovac pulled without difficulty.  Past Medical History  Diagnosis Date  . Hyperlipidemia   . Myalgia   . DJD (degenerative joint disease)   . Shingles   . Diverticulosis   . Vertigo   . Migraine   . Cyst of ovary, right   . Vestibular neuronitis   . MVP (mitral valve prolapse)   .  Carotid artery occlusion   . Allergy     Non-Allergic Rhinitis  . Family history of adverse reaction to anesthesia     SISTER "HEART STOPPED DURING HYQMVHQ"4696 UNKNOWN CAUSE -  SHE DIED 10 YRS LATER OF HEART ATTACK  . Meniere's disease   . Balance problem due to vestibular dysfunction   . Osteoporosis   . Eczema   . History of hiatal hernia   . History of recurrent UTI (urinary tract infection)   . Pupil dilated     DUE TO EYE DROPS FOR TX OF RT  EYE PROBLEM    Assessment/Plan: 1 Day Post-Op Procedure(s) (LRB): RIGHT TOTAL HIP ARTHROPLASTY ANTERIOR APPROACH (Right) Principal Problem:   OA (osteoarthritis) of hip  Estimated body mass index is 22.14 kg/(m^2) as calculated from the following:   Height as of this encounter: 5' 1.75" (1.568 m).   Weight as of this encounter: 54.432 kg (120 lb). Advance diet Up with therapy Plan for discharge tomorrow Discharge to SNF  DVT Prophylaxis - Xarelto Weight Bearing As Tolerated right Leg Hemovac Pulled Begin Therapy HGB is 10.5 on POD 1  Avel Peace, PA-C Orthopaedic Surgery 02/13/2016, 9:05 AM

## 2016-02-13 NOTE — NC FL2 (Signed)
[address] MEDICAID FL2 LEVEL OF CARE SCREENING TOOL     IDENTIFICATION  Patient Name: Lori Frost Birthdate: 05/05/1946 Sex: female Admission Date (Current Location): 02/12/2016  [facility] and [place] Number:  Producer, television/film/video and Address:  [facility],  [address] [place] [address]      Provider Number: [id number]  Attending Physician Name and Address:  Ollen Gross, MD  Relative Name and Phone Number:       Current Level of Care: Hospital Recommended Level of Care: Skilled Nursing Facility Prior Approval Number:    Date Approved/Denied:   PASRR Number: [id number]  Discharge Plan: SNF    Current Diagnoses: Patient Active Problem List   Diagnosis Date Noted  . OA (osteoarthritis) of hip 02/12/2016    Orientation RESPIRATION BLADDER Height & Weight     Self, Time, Situation, Place  Normal Continent Weight: 54.432 kg (120 lb) Height:  5' 1.75" (156.8 cm)  BEHAVIORAL SYMPTOMS/MOOD NEUROLOGICAL BOWEL NUTRITION STATUS  Other (Comment) (NO BEHAVIORS)   Continent Diet  AMBULATORY STATUS COMMUNICATION OF NEEDS Skin   Limited Assist Verbally Surgical wounds                       Personal Care Assistance Level of Assistance  Bathing, Feeding, Dressing Bathing Assistance: Limited assistance Feeding assistance: Independent Dressing Assistance: Limited assistance     Functional Limitations Info  Sight, Hearing, Speech Sight Info: Adequate Hearing Info: Adequate Speech Info: Adequate    SPECIAL CARE FACTORS FREQUENCY  PT (By licensed PT), OT (By licensed OT)     PT Frequency: 5 x wk OT Frequency: 5 x wk            Contractures Contractures Info: Not present    Additional Factors Info  Code Status Code Status Info: FULL CODE             Current Medications (02/13/2016):  This is the current hospital active medication list Current Facility-Administered Medications  Medication Dose Route Frequency Provider Last Rate Last  Dose  . 0.9 %  sodium chloride infusion   Intravenous Continuous Ollen Gross, MD 20 mL/hr at 02/13/16 0700    . acetaminophen (TYLENOL) tablet 650 mg  650 mg Oral Q6H PRN Ollen Gross, MD       Or  . acetaminophen (TYLENOL) suppository 650 mg  650 mg Rectal Q6H PRN Ollen Gross, MD      . acetaminophen (TYLENOL) tablet 1,000 mg  1,000 mg Oral 4 times per day Ollen Gross, MD   1,000 mg at 02/13/16 5621  . bisacodyl (DULCOLAX) suppository 10 mg  10 mg Rectal Daily PRN Ollen Gross, MD      . colesevelam [facility]) tablet 1,875 mg  1,875 mg Oral BID WC Ollen Gross, MD   1,875 mg at 02/13/16 3086  . dexamethasone (DECADRON) injection 10 mg  10 mg Intravenous Once Ollen Gross, MD      . diazepam (VALIUM) tablet 2 mg  2 mg Oral Q8H PRN Ollen Gross, MD      . diphenhydrAMINE (BENADRYL) 12.5 MG/5ML elixir 12.5-25 mg  12.5-25 mg Oral Q4H PRN Ollen Gross, MD      . docusate sodium (COLACE) capsule 100 mg  100 mg Oral BID Ollen Gross, MD   100 mg at 02/12/16 2317  . guaiFENesin (MUCINEX) 12 hr tablet 1,200 mg  1,200 mg Oral BID PRN Ollen Gross, MD      . HYDROmorphone (DILAUDID) injection 0.5 mg  0.5 mg Intravenous Q2H PRN Ollen Gross, MD   0.5 mg at 02/12/16 2113  . HYDROmorphone (DILAUDID) tablet 2-4 mg  2-4 mg Oral Q4H PRN Ollen Gross, MD   2 mg at 02/13/16 1610  . menthol-cetylpyridinium (CEPACOL) lozenge 3 mg  1 lozenge Oral PRN Ollen Gross, MD       Or  . phenol (CHLORASEPTIC) mouth spray 1 spray  1 spray Mouth/Throat PRN Ollen Gross, MD      . methocarbamol (ROBAXIN) tablet 500 mg  500 mg Oral Q6H PRN Ollen Gross, MD   500 mg at 02/12/16 2317   Or  . methocarbamol (ROBAXIN) 500 mg in dextrose 5 % 50 mL IVPB  500 mg Intravenous Q6H PRN Ollen Gross, MD   500 mg at 02/12/16 1607  . metoCLOPramide (REGLAN) tablet 5-10 mg  5-10 mg Oral Q8H PRN Ollen Gross, MD       Or  . metoCLOPramide (REGLAN) injection 5-10 mg  5-10 mg Intravenous Q8H PRN Ollen Gross, MD      .  ondansetron [facility]) tablet 4 mg  4 mg Oral Q6H PRN Ollen Gross, MD       Or  . ondansetron [facility]) injection 4 mg  4 mg Intravenous Q6H PRN Ollen Gross, MD      . polyethylene glycol (MIRALAX / GLYCOLAX) packet 17 g  17 g Oral Daily PRN Ollen Gross, MD      . prednisoLONE acetate (PRED FORTE) 1 % ophthalmic suspension 1 drop  1 drop Both Eyes TID Ollen Gross, MD   1 drop at 02/12/16 2318  . rivaroxaban (XARELTO) tablet 10 mg  10 mg Oral Q breakfast Ollen Gross, MD   10 mg at 02/13/16 0837  . sodium phosphate (FLEET) 7-19 GM/118ML enema 1 enema  1 enema Rectal Once PRN Ollen Gross, MD      . traMADol Janean Sark) tablet 50-100 mg  50-100 mg Oral Q6H PRN Ollen Gross, MD      . triamterene-hydrochlorothiazide (MAXZIDE-25) 37.5-25 MG per tablet 1 tablet  1 tablet Oral Daily Ollen Gross, MD      . trimethoprim (TRIMPEX) tablet 100 mg  100 mg Oral Daily Ollen Gross, MD         Discharge Medications: Please see discharge summary for a list of discharge medications.  Relevant Imaging Results:  Relevant Lab Results:   Additional Information SS # [id number]  Asser Lucena, Dickey Gave, LCSW

## 2016-02-13 NOTE — Clinical Social Work Note (Signed)
Clinical Social Work Assessment  Patient Details  Name: Lori Frost MRN: [id number] Date of Birth: [birth year]  Date of referral:  02/13/16               Reason for consult:  Facility Placement, Discharge Planning                Permission sought to share information with:  Chartered certified accountant granted to share information::  Yes, Verbal Permission Granted  Name::        Agency::     Relationship::     Contact Information:     Housing/Transportation Living arrangements for the past 2 months:  Single Family Home Source of Information:  Patient Patient Interpreter Needed:  None Criminal Activity/Legal Involvement Pertinent to Current Situation/Hospitalization:  No - Comment as needed Significant Relationships:  Friend Lives with:  Self Do you feel safe going back to the place where you live?   (ST REHAB NEEDED) Need for family participation in patient care:  No (Coment)  Care giving concerns:  No care giving concerns reported.   Social Worker assessment / plan: Pt hospitalized on 02/12/16 for a pre planned total right hip arthroplasty. CSW met with pt to assist with d/c planning. Pt has made prior arrangements to have ST Rehab at [facility] at d/c. CSW has confirmed d/c plan with SNF pending [employer] Adv authorization. CSW will assist with authorization process once PT has made recommendations. CSW will continue to follow to assist with d/c planning needs.  Employment status:  [employer] information:  Managed Medicare PT Recommendations:  Not assessed at this time Information / Referral to community resources:     Patient/Family's Response to care:  Pt feels ST Rehab is needed.  Patient/Family's Understanding of and Emotional Response to Diagnosis, Current Treatment, and Prognosis: Pt is pleased her surgery is over and all went well. " I'm in pain but the nsg are helping me with this. " Pt wants to work with therapy but is a little hesitant due  to pain.  Emotional Assessment Appearance:  Appears stated age Attitude/Demeanor/Rapport:  Other (cooperative) Affect (typically observed):  Calm, Appropriate, Pleasant Orientation:  Oriented to Self, Oriented to Place, Oriented to  Time, Oriented to Situation Alcohol / Substance use:  Not Applicable Psych involvement (Current and /or in the community):  No (Comment)  Discharge Needs  Concerns to be addressed:  Discharge Planning Concerns Readmission within the last 30 days:  No Current discharge risk:  None Barriers to Discharge:  No Barriers Identified   Luretha Rued, [place] 02/13/2016, 9:03 AM

## 2016-02-14 DIAGNOSIS — R262 Difficulty in walking, not elsewhere classified: Secondary | ICD-10-CM | POA: Diagnosis not present

## 2016-02-14 DIAGNOSIS — Z471 Aftercare following joint replacement surgery: Secondary | ICD-10-CM | POA: Diagnosis not present

## 2016-02-14 DIAGNOSIS — M1990 Unspecified osteoarthritis, unspecified site: Secondary | ICD-10-CM | POA: Diagnosis not present

## 2016-02-14 DIAGNOSIS — E785 Hyperlipidemia, unspecified: Secondary | ICD-10-CM | POA: Diagnosis not present

## 2016-02-14 DIAGNOSIS — M25551 Pain in right hip: Secondary | ICD-10-CM | POA: Diagnosis not present

## 2016-02-14 DIAGNOSIS — M1611 Unilateral primary osteoarthritis, right hip: Secondary | ICD-10-CM | POA: Diagnosis not present

## 2016-02-14 DIAGNOSIS — Z96641 Presence of right artificial hip joint: Secondary | ICD-10-CM | POA: Diagnosis not present

## 2016-02-14 LAB — BASIC METABOLIC PANEL
Anion gap: 8 (ref 5–15)
BUN: 19 mg/dL (ref 4–21)
BUN: 19 mg/dL (ref 6–20)
CHLORIDE: 107 mmol/L (ref 101–111)
CO2: 27 mmol/L (ref 22–32)
CREATININE: 0.8 mg/dL (ref 0.5–1.1)
Calcium: 8.6 mg/dL — ABNORMAL LOW (ref 8.9–10.3)
Creatinine, Ser: 0.78 mg/dL (ref 0.44–1.00)
GFR calc Af Amer: >60 mL/min (ref >60)
GLUCOSE: 113 mg/dL — AB (ref 65–99)
Glucose: 113 mg/dL
POTASSIUM: 3.7 mmol/L (ref 3.5–5.1)
SODIUM: 142 mmol/L (ref 137–147)
Sodium: 142 mmol/L (ref 135–145)

## 2016-02-14 LAB — CBC
HCT: 32.3 % — ABNORMAL LOW (ref 36.0–46.0)
HEMOGLOBIN: 10.5 g/dL — AB (ref 12.0–15.0)
MCH: 30.1 pg (ref 26.0–34.0)
MCHC: 32.5 g/dL (ref 30.0–36.0)
MCV: 92.6 fL (ref 78.0–100.0)
Platelets: 241 10*3/uL (ref 150–400)
RBC: 3.49 MIL/uL — AB (ref 3.87–5.11)
RDW: 14.7 % (ref 11.5–15.5)
WBC: 13.8 10*3/uL — ABNORMAL HIGH (ref 4.0–10.5)

## 2016-02-14 LAB — CBC AND DIFFERENTIAL: WBC: 13.8 10^3/mL

## 2016-02-14 MED ORDER — RANITIDINE HCL 75 MG PO TABS: 75.0000 mg | ORAL_TABLET | Freq: Two times a day (BID) | ORAL | Status: DC

## 2016-02-14 MED ORDER — RANITIDINE HCL 150 MG PO TABS
75.0000 mg | ORAL_TABLET | Freq: Two times a day (BID) | ORAL | Status: DC
  Administered 2016-02-14: 75 mg via ORAL
  Filled 2016-02-14 (×4): qty 1

## 2016-02-14 MED ORDER — DOCUSATE SODIUM 100 MG PO CAPS: 100.0000 mg | ORAL_CAPSULE | Freq: Two times a day (BID) | ORAL | Status: DC

## 2016-02-14 MED ORDER — HYDROMORPHONE HCL 2 MG PO TABS: 2.0000 mg | ORAL_TABLET | ORAL | Status: DC | PRN

## 2016-02-14 MED ORDER — METOCLOPRAMIDE HCL 5 MG PO TABS: 5.0000 mg | ORAL_TABLET | Freq: Three times a day (TID) | ORAL | Status: DC | PRN

## 2016-02-14 MED ORDER — ONDANSETRON HCL 4 MG PO TABS: 4.0000 mg | ORAL_TABLET | Freq: Four times a day (QID) | ORAL | Status: DC | PRN

## 2016-02-14 MED ORDER — PROMETHAZINE HCL 12.5 MG PO TABS: 12.5000 mg | ORAL_TABLET | Freq: Four times a day (QID) | ORAL | Status: DC | PRN

## 2016-02-14 MED ORDER — PROMETHAZINE HCL 25 MG RE SUPP: 25.0000 mg | Freq: Four times a day (QID) | RECTAL | Status: DC | PRN

## 2016-02-14 MED ORDER — ACETAMINOPHEN 325 MG PO TABS: 650.0000 mg | ORAL_TABLET | Freq: Four times a day (QID) | ORAL | Status: DC | PRN

## 2016-02-14 MED ORDER — TRAMADOL HCL 50 MG PO TABS: 50.0000 mg | ORAL_TABLET | Freq: Four times a day (QID) | ORAL | Status: DC | PRN

## 2016-02-14 MED ORDER — PROMETHAZINE HCL 25 MG PO TABS: 12.5000 mg | ORAL_TABLET | Freq: Four times a day (QID) | ORAL | Status: DC | PRN

## 2016-02-14 MED ORDER — METHOCARBAMOL 500 MG PO TABS: 500.0000 mg | ORAL_TABLET | Freq: Four times a day (QID) | ORAL | Status: DC | PRN

## 2016-02-14 MED ORDER — PROMETHAZINE HCL 25 MG/ML IJ SOLN: 6.2500 mg | Freq: Four times a day (QID) | INTRAMUSCULAR | Status: DC | PRN

## 2016-02-14 MED ORDER — FLEET ENEMA 7-19 GM/118ML RE ENEM: 1.0000 | ENEMA | Freq: Once | RECTAL | Status: DC | PRN

## 2016-02-14 MED ORDER — BISACODYL 10 MG RE SUPP: 10.0000 mg | Freq: Every day | RECTAL | Status: DC | PRN

## 2016-02-14 MED ORDER — RIVAROXABAN 10 MG PO TABS: 10.0000 mg | ORAL_TABLET | Freq: Every day | ORAL | Status: DC

## 2016-02-14 MED ORDER — DIPHENHYDRAMINE HCL 12.5 MG/5ML PO ELIX: 12.5000 mg | ORAL_SOLUTION | ORAL | Status: DC | PRN

## 2016-02-14 NOTE — Progress Notes (Signed)
Physical Therapy Treatment Patient Details Name: Lori Frost MRN: [id number] DOB: 05/30/1946 Today's Date: 02/14/2016    History of Present Illness s/p R DA THA.  H/o chronic vestibular neuritis and Meniere's disease    PT Comments    Pt recovering from increased pain overnight but mobilizing well during session.  Reviewed car transfers with pt and friend.  Follow Up Recommendations  SNF     Equipment Recommendations  None recommended by PT    Recommendations for Other Services OT consult     Precautions / Restrictions Precautions Precautions: Fall Precaution Comments: Hx of vertigo Restrictions Weight Bearing Restrictions: No Other Position/Activity Restrictions: WBAT    Mobility  Bed Mobility               General bed mobility comments: oob  Transfers Overall transfer level: Needs assistance Equipment used: Rolling walker (2 wheeled) Transfers: Sit to/from Stand Sit to Stand: Min guard         General transfer comment: cues for saftey, LE management and use of UEs to self assist  Ambulation/Gait Ambulation/Gait assistance: Min assist;Min guard Ambulation Distance (Feet): 64 Feet Assistive device: Rolling walker (2 wheeled) Gait Pattern/deviations: Step-to pattern;Step-through pattern;Decreased step length - right;Decreased step length - left;Shuffle;Trunk flexed Gait velocity: decr   General Gait Details: cues for sequence, posture, position from RW and stride length.  Pt with difficulty advancing R LE and with mild vaulting over R to advance L - partially corrected with low profile shoe on L.   Stairs            Wheelchair Mobility    Modified Rankin (Stroke Patients Only)       Balance                                    Cognition Arousal/Alertness: Awake/alert Behavior During Therapy: WFL for tasks assessed/performed Overall Cognitive Status: Within Functional Limits for tasks assessed                       Exercises      General Comments        Pertinent Vitals/Pain Pain Assessment: 0-10 Pain Score: 5  Pain Location: R hip Pain Descriptors / Indicators: Aching;Sore;Burning Pain Intervention(s): Limited activity within patient's tolerance;Monitored during session;Premedicated before session;Ice applied    Home Living                      Prior Function            PT Goals (current goals can now be found in the care plan section) Acute Rehab PT Goals Patient Stated Goal: rehab then home PT Goal Formulation: With patient Time For Goal Achievement: 02/18/16 Potential to Achieve Goals: Good Progress towards PT goals: Progressing toward goals    Frequency  7X/week    PT Plan Current plan remains appropriate    Co-evaluation             End of Session Equipment Utilized During Treatment: Gait belt Activity Tolerance: Patient tolerated treatment well Patient left: in chair;with call bell/phone within reach;with chair alarm set     Time: 1145-1210 PT Time Calculation (min) (ACUTE ONLY): 25 min  Charges:  $Gait Training: 8-22 mins $Therapeutic Activity: 8-22 mins                    G Codes:  Lori Frost 02/14/2016, 1:13 PM

## 2016-02-14 NOTE — Clinical Social Work Placement (Signed)
   CLINICAL SOCIAL WORK PLACEMENT  NOTE  Date:  02/14/2016  Patient Details  Name: Lori Frost MRN: [id number] Date of Birth: 1946-10-15  Clinical Social Work is seeking post-discharge placement for this patient at the Skilled  Nursing Facility level of care (*CSW will initial, date and re-position this form in  chart as items are completed):  No   Patient/family provided with [facility] Clinical Social Work Department's list of facilities offering this level of care within the geographic area requested by the patient (or if unable, by the patient's family).  Yes   Patient/family informed of their freedom to choose among providers that offer the needed level of care, that participate in Medicare, Medicaid or managed care program needed by the patient, have an available bed and are willing to accept the patient.  No   Patient/family informed of [place]'s ownership interest in [facility] and [facility], as well as of the fact that they are under no obligation to receive care at these facilities.  PASRR submitted to EDS on 02/12/16     PASRR number received on 02/12/16     Existing PASRR number confirmed on       FL2 transmitted to all facilities in geographic area requested by pt/family on 02/13/16     FL2 transmitted to all facilities within larger geographic area on       Patient informed that his/her managed care company has contracts with or will negotiate with certain facilities, including the following:        Yes   Patient/family informed of bed offers received.  [facility]     Physician recommends and patient [facility]    Patient to [facility] on 02/14/16.  Patient to be transferred to facility by CAR     Patient family notified on 02/14/16 of transfer.  Name of family member notified:  FRIEND     PHYSICIAN       Additional Comment: Pt is in agreement with d/c to [facility] today.  HealthTeam Adv provided authorization for SNF placement. D/C Summary sent to SNF for review prior to d/c. Scripts included in d/c packet. PT approved transport by car. # for report provided to nsg.   _______________________________________________ Royetta AsalHaidinger, Lonzo Saulter Lee, LCSW  [phone] 02/14/2016, 1:48 PM

## 2016-02-14 NOTE — Progress Notes (Signed)
Subjective: 2 Days Post-Op Procedure(s) (LRB): RIGHT TOTAL HIP ARTHROPLASTY ANTERIOR APPROACH (Right) Patient reports pain as mild.   Patient seen in rounds with Dr. Lequita Halt.  She has had some nausea.  Resume her OTC Zantac which she uses at home. Add Phenergan for nausea.  As long as she does better, then can transfer over to [facility] later today. She walked 32 feet and 55 feet with therapy. Patient is well, but has had some minor complaints of nausea/vomiting and pain in the hip, requiring pain medications Patient is ready to go to [facility] if she improves thru the day.  Objective: Vital signs in last 24 hours: Temp:  [98 F (36.7 C)-98.7 F (37.1 C)] 98.7 F (37.1 C) (03/03 0548) Pulse Rate:  [77-96] 79 (03/03 0548) Resp:  [18] 18 (03/03 0548) BP: (113-140)/(53-67) 119/60 mmHg (03/03 0548) SpO2:  [96 %-98 %] 98 % (03/03 0548)  Intake/Output from previous day:  Intake/Output Summary (Last 24 hours) at 02/14/16 0813 Last data filed at 02/14/16 0549  Gross per 24 hour  Intake 1372.33 ml  Output   2675 ml  Net -1302.67 ml    Labs:  Recent Labs  02/13/16 0408 02/14/16 0432  HGB 10.5* 10.5*    Recent Labs  02/13/16 0408 02/14/16 0432  WBC 11.7* 13.8*  RBC 3.52* 3.49*  HCT 31.5* 32.3*  PLT 220 241    Recent Labs  02/13/16 0408 02/14/16 0432  NA 141 142  K 4.3 3.7  CL 106 107  CO2 28 27  BUN 16 19  CREATININE 0.78 0.78  GLUCOSE 153* 113*  CALCIUM 8.5* 8.6*   No results for input(s): LABPT, INR in the last 72 hours.  EXAM: General - Patient is Alert, Appropriate and Oriented Extremity - Neurovascular intact Sensation intact distally Dorsiflexion/Plantar flexion intact Incision - clean, dry, no drainage Motor Function - intact, moving foot and toes well on exam.   Assessment/Plan: 2 Days Post-Op Procedure(s) (LRB): RIGHT TOTAL HIP ARTHROPLASTY ANTERIOR APPROACH (Right) Procedure(s) (LRB): RIGHT TOTAL HIP ARTHROPLASTY ANTERIOR APPROACH  (Right) Past Medical History  Diagnosis Date  . Hyperlipidemia   . Myalgia   . DJD (degenerative joint disease)   . Shingles   . Diverticulosis   . Vertigo   . Migraine   . Cyst of ovary, right   . Vestibular neuronitis   . MVP (mitral valve prolapse)   . Carotid artery occlusion   . Allergy     Non-Allergic Rhinitis  . Family history of adverse reaction to anesthesia     SISTER "HEART STOPPED DURING ZOXWRUE"4540 UNKNOWN CAUSE -  SHE DIED 10 YRS LATER OF HEART ATTACK  . Meniere's disease   . Balance problem due to vestibular dysfunction   . Osteoporosis   . Eczema   . History of hiatal hernia   . History of recurrent UTI (urinary tract infection)   . Pupil dilated     DUE TO EYE DROPS FOR TX OF RT  EYE PROBLEM   Principal Problem:   OA (osteoarthritis) of hip  Estimated body mass index is 22.14 kg/(m^2) as calculated from the following:   Height as of this encounter: 5' 1.75" (1.568 m).   Weight as of this encounter: 54.432 kg (120 lb). Up with therapy Discharge to SNF if she improves Diet - Cardiac diet Follow up - in 2 weeks on Tuesday 02/25/2016 Activity - WBAT Disposition - Skilled nursing facility Condition Upon Discharge - pending improvement D/C Meds - See DC  Summary DVT Prophylaxis - Xarelto  Avel Peacerew Perkins, PA-C Orthopaedic Surgery 02/14/2016, 8:13 AM

## 2016-02-14 NOTE — Discharge Summary (Signed)
Physician Discharge Summary   Patient ID: Lori Frost MRN: [id number] DOB/AGE: January 22, 1946 70 y.o.  Admit date: 02/12/2016 Discharge date: 02-14-2016  Primary Diagnosis:  Osteoarthritis of the Right hip.   Admission Diagnoses:  Past Medical History  Diagnosis Date  . Hyperlipidemia   . Myalgia   . DJD (degenerative joint disease)   . Shingles   . Diverticulosis   . Vertigo   . Migraine   . Cyst of ovary, right   . Vestibular neuronitis   . MVP (mitral valve prolapse)   . Carotid artery occlusion   . Allergy     Non-Allergic Rhinitis  . Family history of adverse reaction to anesthesia     SISTER "HEART STOPPED DURING [id number]"4503 UNKNOWN CAUSE -  SHE DIED 10 YRS LATER OF HEART ATTACK  . Meniere's disease   . Balance problem due to vestibular dysfunction   . Osteoporosis   . Eczema   . History of hiatal hernia   . History of recurrent UTI (urinary tract infection)   . Pupil dilated     DUE TO EYE DROPS FOR TX OF RT  EYE PROBLEM   Discharge Diagnoses:   Principal Problem:   OA (osteoarthritis) of hip  Estimated body mass index is 22.14 kg/(m^2) as calculated from the following:   Height as of this encounter: 5' 1.75" (1.568 m).   Weight as of this encounter: 54.432 kg (120 lb).  Procedure(s) (LRB): RIGHT TOTAL HIP ARTHROPLASTY ANTERIOR APPROACH (Right)   Consults: None  HPI: Lori Frost is a 70 y.o. female who has advanced end-  stage arthritis of her Right hip with progressively worsening pain and  dysfunction.The patient has failed nonoperative management and presents for  total hip arthroplasty.   Laboratory Data: Admission on 02/12/2016  Component Date Value Ref Range Status  . Potassium 02/12/2016 3.9  3.5 - 5.1 mmol/L Final  . WBC 02/13/2016 11.7* 4.0 - 10.5 K/uL Final  . RBC 02/13/2016 3.52* 3.87 - 5.11 MIL/uL Final  . Hemoglobin 02/13/2016 10.5* 12.0 - 15.0 g/dL Final  . HCT 02/13/2016 31.5* 36.0 - 46.0 % Final  . MCV 02/13/2016 89.5  78.0  - 100.0 fL Final  . MCH 02/13/2016 29.8  26.0 - 34.0 pg Final  . MCHC 02/13/2016 33.3  30.0 - 36.0 g/dL Final  . RDW 02/13/2016 14.2  11.5 - 15.5 % Final  . Platelets 02/13/2016 220  150 - 400 K/uL Final  . Sodium 02/13/2016 141  135 - 145 mmol/L Final  . Potassium 02/13/2016 4.3  3.5 - 5.1 mmol/L Final  . Chloride 02/13/2016 106  101 - 111 mmol/L Final  . CO2 02/13/2016 28  22 - 32 mmol/L Final  . Glucose, Bld 02/13/2016 153* 65 - 99 mg/dL Final  . BUN 02/13/2016 16  6 - 20 mg/dL Final  . Creatinine, Ser 02/13/2016 0.78  0.44 - 1.00 mg/dL Final  . Calcium 02/13/2016 8.5* 8.9 - 10.3 mg/dL Final  . GFR calc non Af Amer 02/13/2016 >60  >60 mL/min Final  . GFR calc Af Amer 02/13/2016 >60  >60 mL/min Final   Comment: (NOTE) The eGFR has been calculated using the CKD EPI equation. This calculation has not been validated in all clinical situations. eGFR's persistently <60 mL/min signify possible Chronic Kidney Disease.   . Anion gap 02/13/2016 7  5 - 15 Final  . WBC 02/14/2016 13.8* 4.0 - 10.5 K/uL Final  . RBC 02/14/2016 3.49* 3.87 - 5.11 MIL/uL Final  .  Hemoglobin 02/14/2016 10.5* 12.0 - 15.0 g/dL Final  . HCT 02/14/2016 32.3* 36.0 - 46.0 % Final  . MCV 02/14/2016 92.6  78.0 - 100.0 fL Final  . MCH 02/14/2016 30.1  26.0 - 34.0 pg Final  . MCHC 02/14/2016 32.5  30.0 - 36.0 g/dL Final  . RDW 02/14/2016 14.7  11.5 - 15.5 % Final  . Platelets 02/14/2016 241  150 - 400 K/uL Final  . Sodium 02/14/2016 142  135 - 145 mmol/L Final  . Potassium 02/14/2016 3.7  3.5 - 5.1 mmol/L Final  . Chloride 02/14/2016 107  101 - 111 mmol/L Final  . CO2 02/14/2016 27  22 - 32 mmol/L Final  . Glucose, Bld 02/14/2016 113* 65 - 99 mg/dL Final  . BUN 02/14/2016 19  6 - 20 mg/dL Final  . Creatinine, Ser 02/14/2016 0.78  0.44 - 1.00 mg/dL Final  . Calcium 02/14/2016 8.6* 8.9 - 10.3 mg/dL Final  . GFR calc non Af Amer 02/14/2016 >60  >60 mL/min Final  . GFR calc Af Amer 02/14/2016 >60  >60 mL/min Final    Comment: (NOTE) The eGFR has been calculated using the CKD EPI equation. This calculation has not been validated in all clinical situations. eGFR's persistently <60 mL/min signify possible Chronic Kidney Disease.   Georgiann Hahn gap 02/14/2016 8  5 - 15 Final  Hospital Outpatient Visit on 02/04/2016  Component Date Value Ref Range Status  . aPTT 02/04/2016 28  24 - 37 seconds Final  . WBC 02/04/2016 7.7  4.0 - 10.5 K/uL Final  . RBC 02/04/2016 4.49  3.87 - 5.11 MIL/uL Final  . Hemoglobin 02/04/2016 12.9  12.0 - 15.0 g/dL Final  . HCT 02/04/2016 40.5  36.0 - 46.0 % Final  . MCV 02/04/2016 90.2  78.0 - 100.0 fL Final  . MCH 02/04/2016 28.7  26.0 - 34.0 pg Final  . MCHC 02/04/2016 31.9  30.0 - 36.0 g/dL Final  . RDW 02/04/2016 14.0  11.5 - 15.5 % Final  . Platelets 02/04/2016 288  150 - 400 K/uL Final  . Sodium 02/04/2016 138  135 - 145 mmol/L Final  . Potassium 02/04/2016 5.5* 3.5 - 5.1 mmol/L Final   NO VISIBLE HEMOLYSIS  . Chloride 02/04/2016 102  101 - 111 mmol/L Final  . CO2 02/04/2016 28  22 - 32 mmol/L Final  . Glucose, Bld 02/04/2016 97  65 - 99 mg/dL Final  . BUN 02/04/2016 24* 6 - 20 mg/dL Final  . Creatinine, Ser 02/04/2016 0.89  0.44 - 1.00 mg/dL Final  . Calcium 02/04/2016 9.8  8.9 - 10.3 mg/dL Final  . Total Protein 02/04/2016 7.0  6.5 - 8.1 g/dL Final  . Albumin 02/04/2016 4.2  3.5 - 5.0 g/dL Final  . AST 02/04/2016 27  15 - 41 U/L Final  . ALT 02/04/2016 19  14 - 54 U/L Final  . Alkaline Phosphatase 02/04/2016 57  38 - 126 U/L Final  . Total Bilirubin 02/04/2016 0.3  0.3 - 1.2 mg/dL Final  . GFR calc non Af Amer 02/04/2016 >60  >60 mL/min Final  . GFR calc Af Amer 02/04/2016 >60  >60 mL/min Final   Comment: (NOTE) The eGFR has been calculated using the CKD EPI equation. This calculation has not been validated in all clinical situations. eGFR's persistently <60 mL/min signify possible Chronic Kidney Disease.   . Anion gap 02/04/2016 8  5 - 15 Final  . Prothrombin Time  02/04/2016 13.6  11.6 - 15.2 seconds Final  .  INR 02/04/2016 1.06  0.00 - 1.49 Final  . ABO/RH(D) 02/04/2016 A POS   Final  . Antibody Screen 02/04/2016 NEG   Final  . Sample Expiration 02/04/2016 02/15/2016   Final  . Extend sample reason 02/04/2016 NO TRANSFUSIONS OR PREGNANCY IN THE PAST 3 MONTHS   Final  . Color, Urine 02/04/2016 YELLOW  YELLOW Final  . APPearance 02/04/2016 CLEAR  CLEAR Final  . Specific Gravity, Urine 02/04/2016 1.012  1.005 - 1.030 Final  . pH 02/04/2016 6.5  5.0 - 8.0 Final  . Glucose, UA 02/04/2016 NEGATIVE  NEGATIVE mg/dL Final  . Hgb urine dipstick 02/04/2016 NEGATIVE  NEGATIVE Final  . Bilirubin Urine 02/04/2016 NEGATIVE  NEGATIVE Final  . Ketones, ur 02/04/2016 NEGATIVE  NEGATIVE mg/dL Final  . Protein, ur 90/56/4698 NEGATIVE  NEGATIVE mg/dL Final  . Nitrite 05/20/8949 NEGATIVE  NEGATIVE Final  . Leukocytes, UA 02/04/2016 NEGATIVE  NEGATIVE Final   MICROSCOPIC NOT DONE ON URINES WITH NEGATIVE PROTEIN, BLOOD, LEUKOCYTES, NITRITE, OR GLUCOSE <1000 mg/dL.  Marland Kitchen MRSA, PCR 02/04/2016 NEGATIVE  NEGATIVE Final  . Staphylococcus aureus 02/04/2016 NEGATIVE  NEGATIVE Final   Comment:        The Xpert SA Assay (FDA approved for NASAL specimens in patients over 28 years of age), is one component of a comprehensive surveillance program.  Test performance has been validated by [facility] for patients greater than or equal to 3 year old. It is not intended to diagnose infection nor to guide or monitor treatment.   . ABO/RH(D) 02/04/2016 A POS   Final     X-Rays:Dg Pelvis Portable  02/12/2016  CLINICAL DATA:  Status post right hip replacement today. Postoperative examination. EXAM: PORTABLE PELVIS 1-2 VIEWS COMPARISON:  CT abdomen and pelvis 10/31/2012. FINDINGS: Right total hip arthroplasty is in place. The device is located and there is no fracture. Gas in the soft tissues from surgery and surgical drain are noted. IMPRESSION: Right total hip replacement without  complication. Electronically Signed   By: Drusilla Kanner M.D.   On: 02/12/2016 15:52   Dg C-arm 1-60 Min-no Report  02/12/2016  CLINICAL DATA: surgery C-ARM 1-60 MINUTES Fluoroscopy was utilized by the requesting physician.  No radiographic interpretation.    EKG:No orders found for this or any previous visit.   Hospital Course: Patient was admitted to [facility] and taken to the OR and underwent the above state procedure without complications.  Patient tolerated the procedure well and was later transferred to the recovery room and then to the orthopaedic floor for postoperative care.  They were given PO and IV analgesics for pain control following their surgery.  They were given 24 hours of postoperative antibiotics of  Anti-infectives    Start     Dose/Rate Route Frequency Ordered Stop   02/13/16 1000  trimethoprim (TRIMPEX) tablet 100 mg     100 mg Oral Daily 02/12/16 1720     02/12/16 2000  ceFAZolin (ANCEF) IVPB 2 g/50 mL premix     2 g 100 mL/hr over 30 Minutes Intravenous Every 6 hours 02/12/16 1720 02/13/16 0321   02/12/16 1137  ceFAZolin (ANCEF) IVPB 2 g/50 mL premix     2 g 100 mL/hr over 30 Minutes Intravenous On call to O.R. 02/12/16 1137 02/12/16 1347     and started on DVT prophylaxis in the form of Xarelto.   PT and OT were ordered for total hip protocol.  The patient was allowed to be WBAT with therapy. Social worker  consulted to assist with placement of the patient to SNF.  Discharge planning was consulted to help with postop disposition and equipment needs.  Patient had a good night on the evening of surgery.  They started to get up OOB with therapy on day one.  Hemovac drain was pulled without difficulty.  Continued to work with therapy into day two.  Dressing was changed on day two and the incision was healing well. Patient was seen in rounds on POD 2 and was found to have some intermittent nausea.  PRN meds administered.  As lon as she improved, arrangements were  setup for transfer to the SNF.  As long as she improved, it was felt that she would be able to go to the SNF later that afternoon.  Discharge to SNF if she improves Diet - Cardiac diet Follow up - in 2 weeks on Tuesday 02/25/2016 Activity - WBAT Disposition - Skilled nursing facility Condition Upon Discharge - transferred if improved D/C Meds - See DC Summary DVT Prophylaxis - Xarelto  Discharge Instructions    Call MD / Call 911    Complete by:  As directed   If you experience chest pain or shortness of breath, CALL 911 and be transported to the hospital emergency room.  If you develope a fever above 101 F, pus (white drainage) or increased drainage or redness at the wound, or calf pain, call your surgeon's office.     Change dressing    Complete by:  As directed   You may change your dressing dressing daily with sterile 4 x 4 inch gauze dressing and paper tape.  Do not submerge the incision under water.     Constipation Prevention    Complete by:  As directed   Drink plenty of fluids.  Prune juice may be helpful.  You may use a stool softener, such as Colace (over the counter) 100 mg twice a day.  Use MiraLax (over the counter) for constipation as needed.     Diet - low sodium heart healthy    Complete by:  As directed      Discharge instructions    Complete by:  As directed   Pick up stool softner and laxative for home use following surgery while on pain medications. Do not submerge incision under water. Please use good hand washing techniques while changing dressing each day. May shower starting three days after surgery. Please use a clean towel to pat the incision dry following showers. Continue to use ice for pain and swelling after surgery. Do not use any lotions or creams on the incision until instructed by your surgeon.  Total Hip Protocol.  Take Xarelto for two and a half more weeks, then discontinue Xarelto. Once the patient has completed the Xarelto, they may resume the  81 mg Aspirin.  Postoperative Constipation Protocol  Constipation - defined medically as fewer than three stools per week and severe constipation as less than one stool per week.  One of the most common issues patients have following surgery is constipation.  Even if you have a regular bowel pattern at home, your normal regimen is likely to be disrupted due to multiple reasons following surgery.  Combination of anesthesia, postoperative narcotics, change in appetite and fluid intake all can affect your bowels.  In order to avoid complications following surgery, here are some recommendations in order to help you during your recovery period.  Colace (docusate) - Pick up an over-the-counter form of Colace or another stool softener  and take twice a day as long as you are requiring postoperative pain medications.  Take with a full glass of water daily.  If you experience loose stools or diarrhea, hold the colace until you stool forms back up.  If your symptoms do not get better within 1 week or if they get worse, check with your doctor.  Dulcolax (bisacodyl) - Pick up over-the-counter and take as directed by the product packaging as needed to assist with the movement of your bowels.  Take with a full glass of water.  Use this product as needed if not relieved by Colace only.   MiraLax (polyethylene glycol) - Pick up over-the-counter to have on hand.  MiraLax is a solution that will increase the amount of water in your bowels to assist with bowel movements.  Take as directed and can mix with a glass of water, juice, soda, coffee, or tea.  Take if you go more than two days without a movement. Do not use MiraLax more than once per day. Call your doctor if you are still constipated or irregular after using this medication for 7 days in a row.  If you continue to have problems with postoperative constipation, please contact the office for further assistance and recommendations.  If you experience "the worst  abdominal pain ever" or develop nausea or vomiting, please contact the office immediatly for further recommendations for treatment.  When discharged from the skilled rehab facility, please have the facility set up the patient's [place] prior to being released.  Please make sure this gets set up prior to release in order to avoid any lapse of therapy following the rehab stay.  Also provide the patient with their medications at time of release from the facility to include their pain medication, the muscle relaxants, and their blood thinner medication.  If the patient is still at the rehab facility at time of follow up appointment, please also assist the patient in arranging follow up appointment in our office and any transportation needs. ICE to the affected knee or hip every three hours for 30 minutes at a time and then as needed for pain and swelling.      Do not sit on low chairs, stoools or toilet seats, as it may be difficult to get up from low surfaces    Complete by:  As directed      Driving restrictions    Complete by:  As directed   No driving until released by the physician.     Increase activity slowly as tolerated    Complete by:  As directed      Lifting restrictions    Complete by:  As directed   No lifting until released by the physician.     Patient may shower    Complete by:  As directed   You may shower without a dressing once there is no drainage.  Do not wash over the wound.  If drainage remains, do not shower until drainage stops.     TED hose    Complete by:  As directed   Use stockings (TED hose) for 3 weeks on both leg(s).  You may remove them at night for sleeping.     Weight bearing as tolerated    Complete by:  As directed   Laterality:  right  Extremity:  Lower            Medication List    STOP taking these medications  alendronate 70 MG tablet  Commonly known as:  FOSAMAX     aspirin EC 81 MG tablet     calcium carbonate  600 MG Tabs tablet  Commonly known as:  OS-CAL     clobetasol cream 0.05 %  Commonly known as:  TEMOVATE     Fish Oil 1200 MG Caps     GLUCOSAMINE 1500 COMPLEX PO     naproxen sodium 220 MG tablet  Commonly known as:  ANAPROX     Vitamin D3 1000 units Caps      TAKE these medications        acetaminophen 325 MG tablet  Commonly known as:  TYLENOL  Take 2 tablets (650 mg total) by mouth every 6 (six) hours as needed for mild pain (or Fever >/= 101).     bisacodyl 10 MG suppository  Commonly known as:  DULCOLAX  Place 1 suppository (10 mg total) rectally daily as needed for moderate constipation.     colesevelam 625 MG tablet  Commonly known as:  WELCHOL  Take 1,875 mg by mouth 2 (two) times daily with a meal.     diazepam 2 MG tablet  Commonly known as:  VALIUM  Take 2 mg by mouth every 8 (eight) hours as needed. Vertigo.     diphenhydrAMINE 12.5 MG/5ML elixir  Commonly known as:  BENADRYL  Take 5-10 mLs (12.5-25 mg total) by mouth every 4 (four) hours as needed for itching.     docusate sodium 100 MG capsule  Commonly known as:  COLACE  Take 1 capsule (100 mg total) by mouth 2 (two) times daily.     guaiFENesin 600 MG 12 hr tablet  Commonly known as:  MUCINEX  Take 1,200 mg by mouth 2 (two) times daily as needed for cough (sinuses).     HYDROmorphone 2 MG tablet  Commonly known as:  DILAUDID  Take 1-2 tablets (2-4 mg total) by mouth every 4 (four) hours as needed for moderate pain or severe pain.     LORazepam 0.5 MG tablet  Commonly known as:  ATIVAN  Take 0.5 mg by mouth every 6 (six) hours as needed (vertigo.).     methocarbamol 500 MG tablet  Commonly known as:  ROBAXIN  Take 1 tablet (500 mg total) by mouth every 6 (six) hours as needed for muscle spasms.     metoCLOPramide 5 MG tablet  Commonly known as:  REGLAN  Take 1 tablet (5 mg total) by mouth every 8 (eight) hours as needed for nausea (if ondansetron (ZOFRAN) ineffective.).     ondansetron 4  MG tablet  Commonly known as:  ZOFRAN  Take 1 tablet (4 mg total) by mouth every 6 (six) hours as needed for nausea.     prednisoLONE acetate 1 % ophthalmic suspension  Commonly known as:  PRED FORTE  1 drop 3 (three) times daily.     promethazine 12.5 MG tablet  Commonly known as:  PHENERGAN  Take 1-2 tablets (12.5-25 mg total) by mouth every 6 (six) hours as needed for nausea.     ranitidine 75 MG tablet  Commonly known as:  ZANTAC  Take 1 tablet (75 mg total) by mouth 2 (two) times daily.     rivaroxaban 10 MG Tabs tablet  Commonly known as:  XARELTO  Take 1 tablet (10 mg total) by mouth daily with breakfast. Take Xarelto for two and a half more weeks, then discontinue Xarelto. Once the patient has completed the Xarelto, they  may resume the 81 mg Aspirin.     sodium phosphate 7-19 GM/118ML Enem  Place 133 mLs (1 enema total) rectally once as needed for severe constipation.     traMADol 50 MG tablet  Commonly known as:  ULTRAM  Take 1-2 tablets (50-100 mg total) by mouth every 6 (six) hours as needed (mild pain).     triamterene-hydrochlorothiazide 37.5-25 MG tablet  Commonly known as:  MAXZIDE-25  Take 1 tablet by mouth daily.     trimethoprim 100 MG tablet  Commonly known as:  TRIMPEX  Take 100 mg by mouth daily.           Follow-up Information    Follow up with Gearlean Alf, MD On 02/25/2016.   Specialty:  Orthopedic Surgery   Why:  Call office at [phone] to setup appointment on Tuesday 02/24/2106 with Dr. Wynelle Link.   Contact information:   [address] [place] [address] [phone]       Signed: Arlee Muslim, PA-C Orthopaedic Surgery 02/14/2016, 8:23 AM

## 2016-02-14 NOTE — Progress Notes (Signed)
Occupational Therapy Treatment Patient Details Name: Lori Frost G Sterkel MRN: [id number] DOB: 1946-03-08 Today's Date: 02/14/2016    History of present illness s/p R DA THA.  H/o chronic vestibular neuritis and Meniere's disease   OT comments  Pt reports she had gotten into pain cycle early this am, but felt better.  Attempted to walk to bathroom, but she was unable to tolerate this  Follow Up Recommendations  SNF    Equipment Recommendations  3 in 1 bedside comode    Recommendations for Other Services      Precautions / Restrictions Precautions Precautions: Fall Precaution Comments: Hx of vertigo Restrictions Weight Bearing Restrictions: No Other Position/Activity Restrictions: WBAT       Mobility Bed Mobility               General bed mobility comments: oob  Transfers Overall transfer level: Needs assistance Equipment used: Rolling walker (2 wheeled) Transfers: Sit to/from Stand Sit to Stand: Min guard         General transfer comment: pt initially began to stand before RW was placed:  cued to sit back down and wait.  cues for LE placement for comfort--pt has difficulty sliding foot when sitting    Balance                                   ADL                                         General ADL Comments: Ambulated to bathroom door; pt was yelling with pain and did not want to return to chair initially.  When she made it to door, she agreed to return to chair.  Sat for grooming and RN alerted.  She brought more pain medication      Vision                     Perception     Praxis      Cognition   Behavior During Therapy: WFL for tasks assessed/performed Overall Cognitive Status: Within Functional Limits for tasks assessed--cues for safety this session                       Extremity/Trunk Assessment               Exercises     Shoulder Instructions       General Comments       Pertinent Vitals/ Pain       Pain Score: 5  (reports "5"; shivering, ? due to pain) Pain Location: R hip Pain Descriptors / Indicators: Aching Pain Intervention(s): Limited activity within patient's tolerance;Monitored during session;Patient requesting pain meds-RN notified;Repositioned;Ice applied  Home Living                                          Prior Functioning/Environment              Frequency Min 2X/week     Progress Toward Goals  OT Goals(current goals can now be found in the care plan section)  Progress towards OT goals: Progressing toward goals  Acute Rehab OT Goals Patient Stated Goal: rehab then home  Plan  Co-evaluation                 End of Session     Activity Tolerance Patient limited by pain   Patient Left in chair;with call bell/phone within reach   Nurse Communication          Time: 6962-9528 OT Time Calculation (min): 11 min  Charges: OT General Charges $OT Visit: 1 Procedure OT Treatments $Therapeutic Activity: 8-22 mins  Kymari Lollis 02/14/2016, 10:18 AM  Marica Otter, OTR/L [phone] 02/14/2016

## 2016-02-17 ENCOUNTER — Other Ambulatory Visit: Payer: Self-pay | Admitting: *Deleted

## 2016-02-17 ENCOUNTER — Non-Acute Institutional Stay (SKILLED_NURSING_FACILITY): Payer: PPO | Admitting: Internal Medicine

## 2016-02-17 ENCOUNTER — Encounter: Payer: Self-pay | Admitting: Internal Medicine

## 2016-02-17 DIAGNOSIS — I10 Essential (primary) hypertension: Secondary | ICD-10-CM | POA: Diagnosis not present

## 2016-02-17 DIAGNOSIS — E785 Hyperlipidemia, unspecified: Secondary | ICD-10-CM | POA: Diagnosis not present

## 2016-02-17 DIAGNOSIS — D72829 Elevated white blood cell count, unspecified: Secondary | ICD-10-CM

## 2016-02-17 DIAGNOSIS — D62 Acute posthemorrhagic anemia: Secondary | ICD-10-CM

## 2016-02-17 DIAGNOSIS — M1611 Unilateral primary osteoarthritis, right hip: Secondary | ICD-10-CM | POA: Diagnosis not present

## 2016-02-17 DIAGNOSIS — K219 Gastro-esophageal reflux disease without esophagitis: Secondary | ICD-10-CM | POA: Diagnosis not present

## 2016-02-17 DIAGNOSIS — H8103 Meniere's disease, bilateral: Secondary | ICD-10-CM

## 2016-02-17 DIAGNOSIS — K5901 Slow transit constipation: Secondary | ICD-10-CM | POA: Diagnosis not present

## 2016-02-17 DIAGNOSIS — R2681 Unsteadiness on feet: Secondary | ICD-10-CM

## 2016-02-17 MED ORDER — TRAMADOL HCL 50 MG PO TABS: ORAL_TABLET | ORAL | Status: DC

## 2016-02-17 NOTE — Telephone Encounter (Signed)
[facility] 

## 2016-02-17 NOTE — Progress Notes (Signed)
LOCATION: [facility]  PCP: Gaye Alken, MD   Code Status: Full Code  Goals of care: Advanced Directive information Advanced Directives 02/12/2016  Does patient have an advance directive? No  Would patient like information on creating an advanced directive? No - patient declined information       Extended Emergency Contact Information Primary Emergency Contact: Smith,[place] Address: [address]          [place],  [place] Home Phone: [phone] Work Phone: [phone] Mobile Phone: [phone] Relation: Friend   Allergies  Allergen Reactions  . Acetazolamide Other (See Comments)    Lethargy and worsened aura symptoms. Lethargy and worsened aura symptoms.  . Codeine Nausea Only  . Sertraline Hcl     Made her antsy"  . Statins     Joint pain.   . Tape Dermatitis    Prefers paper tape  . Topamax [Topiramate]   . Tramadol Itching    Chief Complaint  Patient presents with  . New Admit To SNF    New Admission     HPI:  Patient is a 70 y.o. female seen today for short term rehabilitation post hospital admission from 02/12/16-02/14/16 with right hip OA. she underwent right total hip arthroplasty. Her pain is under control with current pain regimen. She is allergic to tramadol and would like it discontinued. She has been constipated. She has history of meinere's disease, gerd, HLD among others.   Review of Systems:  Constitutional: Negative for fever, chills. Energy level improving HENT: Positive for nasal congestion and nasal saline spray has been helpful. Negative for headache, nasal discharge, hearing loss, sore throat, difficulty swallowing.   Eyes: Negative for blurred vision, double vision and discharge.  Respiratory: Negative for cough, shortness of breath and wheezing.   Cardiovascular: Negative for chest pain, palpitations, orthopnea, leg swelling.  Gastrointestinal: Negative for heartburn, nausea, vomiting, abdominal  pain, loss of appetite. Positive for constipation. Hard stool and straining present. No blood in stool. Genitourinary: Negative for dysuria Musculoskeletal: Negative for back pain, falls. Skin: Negative for itching, rash.  Neurological: Negative for weakness, tingling.  Positive for chronic dizziness. Psychiatric/Behavioral: Negative for depression, anxiety, insomnia and memory loss.    Past Medical History  Diagnosis Date  . Hyperlipidemia   . Myalgia   . DJD (degenerative joint disease)   . Shingles   . Diverticulosis   . Vertigo   . Migraine   . Cyst of ovary, right   . Vestibular neuronitis   . MVP (mitral valve prolapse)   . Carotid artery occlusion   . Allergy     Non-Allergic Rhinitis  . Family history of adverse reaction to anesthesia     SISTER "HEART STOPPED DURING BMWUXLK"4401 UNKNOWN CAUSE -  SHE DIED 10 YRS LATER OF HEART ATTACK  . Meniere's disease   . Balance problem due to vestibular dysfunction   . Osteoporosis   . Eczema   . History of hiatal hernia   . History of recurrent UTI (urinary tract infection)   . Pupil dilated     DUE TO EYE DROPS FOR TX OF RT  EYE PROBLEM   Past Surgical History  Procedure Laterality Date  . Eye surgery Right 02-05-15    Cataract  . Eye surgery Left 02-21-15    Cataract  . Tonsillectomy    . Total hip arthroplasty Right 02/12/2016    Procedure: RIGHT TOTAL HIP ARTHROPLASTY ANTERIOR APPROACH;  Surgeon: Ollen Gross, MD;  Location: [facility];  Service: Orthopedics;  Laterality: Right;   Social History:   reports that she has never smoked. She has never used smokeless tobacco. She reports that she does not drink alcohol or use illicit drugs.  Family History  Problem Relation Age of Onset  . Heart attack Mother   . Diabetes Mother   . Diabetes Father   . Heart attack Sister   . Cancer Sister     Medications:   Medication List       This list is accurate as of: 02/17/16 11:43 AM.  Always use your most recent med list.                acetaminophen 325 MG tablet  Commonly known as:  TYLENOL  Take 2 tablets (650 mg total) by mouth every 6 (six) hours as needed for mild pain (or Fever >/= 101).     aspirin 81 MG chewable tablet  Chew 81 mg by mouth daily. Give 1 tablet by mouth daily once XARELTO has completed 2 week dose     bisacodyl 10 MG suppository  Commonly known as:  DULCOLAX  Place 1 suppository (10 mg total) rectally daily as needed for moderate constipation.     colesevelam 625 MG tablet  Commonly known as:  WELCHOL  Take 1,875 mg by mouth 2 (two) times daily with a meal.     diazepam 2 MG tablet  Commonly known as:  VALIUM  Take 2 mg by mouth every 8 (eight) hours as needed. Vertigo.     diphenhydrAMINE 12.5 MG/5ML elixir  Commonly known as:  BENADRYL  Take 5-10 mLs (12.5-25 mg total) by mouth every 4 (four) hours as needed for itching.     docusate sodium 100 MG capsule  Commonly known as:  COLACE  Take 1 capsule (100 mg total) by mouth 2 (two) times daily.     guaiFENesin 600 MG 12 hr tablet  Commonly known as:  MUCINEX  Take 1,200 mg by mouth 2 (two) times daily as needed for cough (sinuses).     HYDROmorphone 2 MG tablet  Commonly known as:  DILAUDID  Take 1-2 tablets (2-4 mg total) by mouth every 4 (four) hours as needed for moderate pain or severe pain.     LORazepam 0.5 MG tablet  Commonly known as:  ATIVAN  Take 0.5 mg by mouth every 6 (six) hours as needed (vertigo.).     methocarbamol 500 MG tablet  Commonly known as:  ROBAXIN  Take 1 tablet (500 mg total) by mouth every 6 (six) hours as needed for muscle spasms.     metoCLOPramide 5 MG tablet  Commonly known as:  REGLAN  Take 1 tablet (5 mg total) by mouth every 8 (eight) hours as needed for nausea (if ondansetron (ZOFRAN) ineffective.).     ondansetron 4 MG tablet  Commonly known as:  ZOFRAN  Take 1 tablet (4 mg total) by mouth every 6 (six) hours as needed for nausea.     prednisoLONE acetate 1 % ophthalmic  suspension  Commonly known as:  PRED FORTE  1 drop 3 (three) times daily.     promethazine 12.5 MG tablet  Commonly known as:  PHENERGAN  Take 1-2 tablets (12.5-25 mg total) by mouth every 6 (six) hours as needed for nausea.     ranitidine 75 MG tablet  Commonly known as:  ZANTAC  Take 1 tablet (75 mg total) by mouth 2 (two) times daily.     rivaroxaban 10 MG  Tabs tablet  Commonly known as:  XARELTO  Take 1 tablet (10 mg total) by mouth daily with breakfast. Take Xarelto for two and a half more weeks, then discontinue Xarelto. Once the patient has completed the Xarelto, they may resume the 81 mg Aspirin.     sodium phosphate 7-19 GM/118ML Enem  Place 133 mLs (1 enema total) rectally once as needed for severe constipation.     triamterene-hydrochlorothiazide 37.5-25 MG tablet  Commonly known as:  MAXZIDE-25  Take 1 tablet by mouth daily.     trimethoprim 100 MG tablet  Commonly known as:  TRIMPEX  Take 100 mg by mouth daily.        Physical Exam: Filed Vitals:   02/17/16 0848  BP: 136/78  Pulse: 80  Temp: 98.4 F (36.9 C)  TempSrc: Oral  Resp: 18  Height: 5\' 1"  (1.549 m)  Weight: 120 lb (54.432 kg)  SpO2: 98%   Body mass index is 22.69 kg/(m^2).  General- elderly female, well built, in no acute distress Head- normocephalic, atraumatic Nose- no maxillary or frontal sinus tenderness, no nasal discharge Throat- moist mucus membrane Eyes- PERRLA, EOMI, no pallor, no icterus, no discharge, normal conjunctiva, normal sclera Neck- no cervical lymphadenopathy Cardiovascular- normal s1,s2, no murmurs, trace leg edema Respiratory- bilateral clear to auscultation, no wheeze, no rhonchi, no crackles, no use of accessory muscles Abdomen- bowel sounds present, soft, non tender Musculoskeletal- able to move all 4 extremities, limited right hip ROM, ted hose + Neurological- alert and oriented to person, place and time Skin- warm and dry, right hip surgical incision with steri  strip and healing well Psychiatry- normal mood and affect    Labs reviewed: Basic Metabolic Panel:  Recent Labs  16/10/96 1110 02/12/16 1200 02/13/16 0408 02/14/16 02/14/16 0432  NA 138  --  141 142 142  K 5.5* 3.9 4.3  --  3.7  CL 102  --  106  --  107  CO2 28  --  28  --  27  GLUCOSE 97  --  153*  --  113*  BUN 24*  --  16 19 19   CREATININE 0.89  --  0.78 0.8 0.78  CALCIUM 9.8  --  8.5*  --  8.6*   Liver Function Tests:  Recent Labs  02/04/16 1110  AST 27  ALT 19  ALKPHOS 57  BILITOT 0.3  PROT 7.0  ALBUMIN 4.2   No results for input(s): LIPASE, AMYLASE in the last 8760 hours. No results for input(s): AMMONIA in the last 8760 hours. CBC:  Recent Labs  02/04/16 1110 02/13/16 0408 02/14/16 02/14/16 0432  WBC 7.7 11.7* 13.8 13.8*  HGB 12.9 10.5*  --  10.5*  HCT 40.5 31.5*  --  32.3*  MCV 90.2 89.5  --  92.6  PLT 288 220  --  241    Radiological Exams: Dg Pelvis Portable  02/12/2016  CLINICAL DATA:  Status post right hip replacement today. Postoperative examination. EXAM: PORTABLE PELVIS 1-2 VIEWS COMPARISON:  CT abdomen and pelvis 10/31/2012. FINDINGS: Right total hip arthroplasty is in place. The device is located and there is no fracture. Gas in the soft tissues from surgery and surgical drain are noted. IMPRESSION: Right total hip replacement without complication. Electronically Signed   By: Drusilla Kanner M.D.   On: 02/12/2016 15:52   Dg C-arm 1-60 Min-no Report  02/12/2016  CLINICAL DATA: surgery C-ARM 1-60 MINUTES Fluoroscopy was utilized by the requesting physician.  No radiographic interpretation.  Assessment/Plan  Unsteady gait Will have her work with physical therapy and occupational therapy team to help with gait training and muscle strengthening exercises.fall precautions. Skin care. Encourage to be out of bed.   Right hip OA S/p right hip arthroplasty. Has follow up with orthopedics. Will have patient work with PT/OT as tolerated to regain  strength and restore function.  Fall precautions are in place. Continue tylenol as needed and dilaudid 2 mg 1-2 tab q4h prn pain. Continue robaxin as needed for muscle spasm. Discontinue tramadol. Continue xarelto for dvt prophylaxis.   Leukocytosis No clinical signs of infection. Likely stress response. Check cbc with diff  Blood loss anemia Post op, check cbc  Constipation Discontinue colace. Start senna s 2 tab qhs and miralax for now and monitor. Continue prn dulcolax suppository  Meinere's disease Continue maxzide and prn valium, [facility]  HTN Stable, continue maxzide, check bp and bmp. Continue baby aspirin.   HLD Continue welchol  gerd Stable, continue zantac    Goals of care: short term rehabilitation   Labs/tests ordered: cbc with diff, cmp 02/19/16  Family/ staff Communication: reviewed care plan with patient and nursing supervisor    Oneal GroutMAHIMA Renly Guedes, MD Internal Medicine [facility] [address] [place], [place] [address] Cell Phone (Monday-Friday 8 am - 5 pm): [phone] On Call: [phone] and follow prompts after 5 pm and on weekends Office Phone: [phone] Office Fax: [phone]

## 2016-02-21 ENCOUNTER — Non-Acute Institutional Stay: Payer: PPO | Admitting: Adult Health

## 2016-02-21 ENCOUNTER — Encounter: Payer: Self-pay | Admitting: Adult Health

## 2016-02-21 DIAGNOSIS — Z96641 Presence of right artificial hip joint: Secondary | ICD-10-CM | POA: Diagnosis not present

## 2016-02-21 DIAGNOSIS — K219 Gastro-esophageal reflux disease without esophagitis: Secondary | ICD-10-CM | POA: Diagnosis not present

## 2016-02-21 DIAGNOSIS — H8103 Meniere's disease, bilateral: Secondary | ICD-10-CM | POA: Diagnosis not present

## 2016-02-21 DIAGNOSIS — D62 Acute posthemorrhagic anemia: Secondary | ICD-10-CM | POA: Diagnosis not present

## 2016-02-21 DIAGNOSIS — M1990 Unspecified osteoarthritis, unspecified site: Secondary | ICD-10-CM | POA: Diagnosis not present

## 2016-02-21 DIAGNOSIS — M791 Myalgia: Secondary | ICD-10-CM | POA: Diagnosis not present

## 2016-02-21 DIAGNOSIS — K5901 Slow transit constipation: Secondary | ICD-10-CM

## 2016-02-21 DIAGNOSIS — M25551 Pain in right hip: Secondary | ICD-10-CM | POA: Diagnosis not present

## 2016-02-21 DIAGNOSIS — M1611 Unilateral primary osteoarthritis, right hip: Secondary | ICD-10-CM

## 2016-02-21 DIAGNOSIS — I10 Essential (primary) hypertension: Secondary | ICD-10-CM | POA: Diagnosis not present

## 2016-02-21 DIAGNOSIS — E785 Hyperlipidemia, unspecified: Secondary | ICD-10-CM | POA: Diagnosis not present

## 2016-02-21 DIAGNOSIS — R262 Difficulty in walking, not elsewhere classified: Secondary | ICD-10-CM | POA: Diagnosis not present

## 2016-02-21 NOTE — Progress Notes (Signed)
Patient ID: Lori Frost, female   DOB: 12/24/1945, 70 y.o.   MRN: [id number]    DATE:  02/21/2016   MRN:  [id number]  BIRTHDAY: 04/13/1946  Facility:  Nursing Home Location:  [facility]  Nursing Home Room Number: 1202-P  LEVEL OF CARE:  SNF [phone])  Contact Information    Name Relation Home Work Mobile   Smith,[place]ia Clearence Cheek [phone]       Code Status History    This patient does not have a recorded code status. Please follow your organizational policy for patients in this situation.       Chief Complaint  Patient presents with  . Discharge Note    HISTORY OF PRESENT ILLNESS:  This is a 70 year old female who is for discharge home with outpatient clinic. DME:  Rolling walker and 3-in-1 commode. She has been admitted to [facility] on 02/14/16 from [facility]. She has PMH of Meinere's disease, GERD and hyperlipidemia. She has right hip osteoarthritis for which she had right total hip arthroplasty.  Patient was admitted to this facility for short-term rehabilitation after the patient's recent hospitalization.  Patient has completed SNF rehabilitation and therapy has cleared the patient for discharge.  PAST MEDICAL HISTORY:  Past Medical History  Diagnosis Date  . Hyperlipidemia   . Myalgia   . DJD (degenerative joint disease)   . Shingles   . Diverticulosis   . Vertigo   . Migraine   . Cyst of ovary, right   . Vestibular neuronitis   . MVP (mitral valve prolapse)   . Carotid artery occlusion   . Allergy     Non-Allergic Rhinitis  . Family history of adverse reaction to anesthesia     SISTER "HEART STOPPED DURING UUVOZDG"6440 UNKNOWN CAUSE -  SHE DIED 10 YRS LATER OF HEART ATTACK  . Meniere's disease   . Balance problem due to vestibular dysfunction   . Osteoporosis   . Eczema   . History of hiatal hernia   . History of recurrent UTI (urinary tract infection)   . Pupil dilated     DUE TO EYE DROPS FOR  TX OF RT  EYE PROBLEM     CURRENT MEDICATIONS: Reviewed  Patient's Medications  New Prescriptions   No medications on file  Previous Medications   ACETAMINOPHEN (TYLENOL) 325 MG TABLET    Take 2 tablets (650 mg total) by mouth every 6 (six) hours as needed for mild pain (or Fever >/= 101).   ASPIRIN 81 MG CHEWABLE TABLET    Chew 81 mg by mouth daily. Give 1 tablet by mouth daily once XARELTO has completed 2 week dose   BISACODYL (DULCOLAX) 10 MG SUPPOSITORY    Place 1 suppository (10 mg total) rectally daily as needed for moderate constipation.   COLESEVELAM (WELCHOL) 625 MG TABLET    Take 1,875 mg by mouth 2 (two) times daily with a meal.     DIAZEPAM (VALIUM) 2 MG TABLET    Take 2 mg by mouth every 8 (eight) hours as needed. Vertigo.   DIPHENHYDRAMINE (BENADRYL) 12.5 MG/5ML ELIXIR    Take 5-10 mLs (12.5-25 mg total) by mouth every 4 (four) hours as needed for itching.   GUAIFENESIN (MUCINEX) 600 MG 12 HR TABLET    Take 1,200 mg by mouth 2 (two) times daily as needed for cough (sinuses).   HYDROMORPHONE (DILAUDID) 2 MG TABLET    Take 1-2 tablets (2-4 mg total) by mouth every 4 (  four) hours as needed for moderate pain or severe pain.   METHOCARBAMOL (ROBAXIN) 500 MG TABLET    Take 1 tablet (500 mg total) by mouth every 6 (six) hours as needed for muscle spasms.   METOCLOPRAMIDE (REGLAN) 5 MG TABLET    Take 1 tablet (5 mg total) by mouth every 8 (eight) hours as needed for nausea (if ondansetron (ZOFRAN) ineffective.).   ONDANSETRON (ZOFRAN) 4 MG TABLET    Take 1 tablet (4 mg total) by mouth every 6 (six) hours as needed for nausea.   POLYETHYLENE GLYCOL (MIRALAX / GLYCOLAX) PACKET    Take 17 g by mouth daily as needed for mild constipation.   PREDNISOLONE ACETATE (PRED FORTE) 1 % OPHTHALMIC SUSPENSION    1 drop 3 (three) times daily.   PROMETHAZINE (PHENERGAN) 12.5 MG TABLET    Take 1-2 tablets (12.5-25 mg total) by mouth every 6 (six) hours as needed for nausea.   RANITIDINE (ZANTAC) 75 MG  TABLET    Take 1 tablet (75 mg total) by mouth 2 (two) times daily.   RIVAROXABAN (XARELTO) 10 MG TABS TABLET    Take 1 tablet (10 mg total) by mouth daily with breakfast. Take Xarelto for two and a half more weeks, then discontinue Xarelto. Once the patient has completed the Xarelto, they may resume the 81 mg Aspirin.   SENNOSIDES-DOCUSATE SODIUM (SENOKOT-S) 8.6-50 MG TABLET    Take 2 tablets by mouth at bedtime.   SODIUM PHOSPHATE (FLEET) 7-19 GM/118ML ENEM    Place 133 mLs (1 enema total) rectally once as needed for severe constipation.   TRIAMTERENE-HYDROCHLOROTHIAZIDE (MAXZIDE-25) 37.5-25 MG TABLET    Take 1 tablet by mouth daily.   TRIMETHOPRIM (TRIMPEX) 100 MG TABLET    Take 100 mg by mouth daily.  Modified Medications   No medications on file  Discontinued Medications   DOCUSATE SODIUM (COLACE) 100 MG CAPSULE    Take 1 capsule (100 mg total) by mouth 2 (two) times daily.   LORAZEPAM (ATIVAN) 0.5 MG TABLET    Take 0.5 mg by mouth every 6 (six) hours as needed (vertigo.).      Allergies  Allergen Reactions  . Acetazolamide Other (See Comments)    Lethargy and worsened aura symptoms. Lethargy and worsened aura symptoms.  . Codeine Nausea Only  . Sertraline Hcl     Made her antsy"  . Statins     Joint pain.   . Tape Dermatitis    Prefers paper tape  . Topamax [Topiramate]   . Tramadol Itching     REVIEW OF SYSTEMS:  GENERAL: no change in appetite, no fatigue, no weight changes, no fever, chills or weakness EYES: Denies change in vision, dry eyes, eye pain, itching or discharge EARS: Denies change in hearing, ringing in ears, or earache NOSE: Denies nasal congestion or epistaxis MOUTH and THROAT: Denies oral discomfort, gingival pain or bleeding, pain from teeth or hoarseness   RESPIRATORY: no cough, SOB, DOE, wheezing, hemoptysis CARDIAC: no chest pain, edema or palpitations GI: no abdominal pain, diarrhea, constipation, heart burn, nausea or vomiting GU: Denies dysuria,  frequency, hematuria, incontinence, or discharge PSYCHIATRIC: Denies feeling of depression or anxiety. No report of hallucinations, insomnia, paranoia, or agitation    PHYSICAL EXAMINATION  GENERAL APPEARANCE: Well nourished. In no acute distress. Normal body habitus SKIN:  Right hip surgical incision has steri-strips, dry, no erythema HEAD: Normal in size and contour. No evidence of trauma EYES: Lids open and close normally. No blepharitis, entropion or  ectropion. PERRL. Conjunctivae are clear and sclerae are white. Lenses are without opacity EARS: Pinnae are normal. Patient hears normal voice tunes of the examiner MOUTH and THROAT: Lips are without lesions. Oral mucosa is moist and without lesions. Tongue is normal in shape, size, and color and without lesions NECK: supple, trachea midline, no neck masses, no thyroid tenderness, no thyromegaly LYMPHATICS: no LAN in the neck, no supraclavicular LAN RESPIRATORY: breathing is even & unlabored, BS CTAB CARDIAC: RRR, no murmur,no extra heart sounds, no edema GI: abdomen soft, normal BS, no masses, no tenderness, no hepatomegaly, no splenomegaly EXTREMITIES:  Able to move X 4 extremities PSYCHIATRIC: Alert and oriented X 3. Affect and behavior are appropriate  LABS/RADIOLOGY: Labs reviewed: 02/19/16   WBC 6.9 hemoglobin 9.6 hematocrit 30.0 MCV 92.0 platelet 318 sodium 143 potassium 3.5 glucose 88 BUN 18 creatinine 0.85 calcium 8.5 Basic Metabolic Panel:  Recent Labs  16/10/96 1110 02/12/16 1200 02/13/16 0408 02/14/16 02/14/16 0432  NA 138  --  141 142 142  K 5.5* 3.9 4.3  --  3.7  CL 102  --  106  --  107  CO2 28  --  28  --  27  GLUCOSE 97  --  153*  --  113*  BUN 24*  --  16 19 19   CREATININE 0.89  --  0.78 0.8 0.78  CALCIUM 9.8  --  8.5*  --  8.6*   Liver Function Tests:  Recent Labs  02/04/16 1110  AST 27  ALT 19  ALKPHOS 57  BILITOT 0.3  PROT 7.0  ALBUMIN 4.2   CBC:  Recent Labs  02/04/16 1110 02/13/16 0408  02/14/16 02/14/16 0432  WBC 7.7 11.7* 13.8 13.8*  HGB 12.9 10.5*  --  10.5*  HCT 40.5 31.5*  --  32.3*  MCV 90.2 89.5  --  92.6  PLT 288 220  --  241    Dg Pelvis Portable  02/12/2016  CLINICAL DATA:  Status post right hip replacement today. Postoperative examination. EXAM: PORTABLE PELVIS 1-2 VIEWS COMPARISON:  CT abdomen and pelvis 10/31/2012. FINDINGS: Right total hip arthroplasty is in place. The device is located and there is no fracture. Gas in the soft tissues from surgery and surgical drain are noted. IMPRESSION: Right total hip replacement without complication. Electronically Signed   By: Drusilla Kanner M.D.   On: 02/12/2016 15:52   Dg C-arm 1-60 Min-no Report  02/12/2016  CLINICAL DATA: surgery C-ARM 1-60 MINUTES Fluoroscopy was utilized by the requesting physician.  No radiographic interpretation.    ASSESSMENT/PLAN:  Osteoarthritis of right hip S/P right hip arthroplasty - for outpatient clinic therapy; continue Acetaminophen 325 mg take 2 tabs = 650 mg PO Q 6 HRS PRN and Dilaudid 2mg  1-2 tabs PO Q 4 hours PRN for pain; Robaxin 500 mg 1 tab PO Q 6 HRS PRN for muscle spasm;  Diazepam 2 mg 1 tab PO Q 8 hours PRN for muscle spasm; Xarelto 10 mg 1 tab PO Q D till 02/28/16 the ASA 81 mg daily for DVT prophylaxis; follow-up with Dr. Lequita Halt, orthopedic surgeon.  Hypertension - well-controlled; continue Maxzide 37.5-25 mg 1 tab PO daily  Hyperlipidemia - continue Welchol 625 mg take 3 tabs = 1875 mg PO BID  GERD -  Stable; continue Ranitidine 75 mg 1 tab PO BID  Constipation - continue Senna-S 2 tabs PO Q HS  Meniere's Disease - follows-up with neurology @ [facility]  Anemia, acute blood loss - re-check hgb 9.6 ;  stable      I have filled out patient's discharge paperwork and written prescriptions.  Patient will have outpatient clinic therapy.  DME provided:  Rolling walker and 3-in-1 commode  Total discharge time: Greater than 30 minutes  Discharge time involved coordination  of the discharge process with Child psychotherapist, nursing staff and therapy department. Medical justification for DME verified.    [facility], NP [employer] [phone]

## 2016-03-02 DIAGNOSIS — M25551 Pain in right hip: Secondary | ICD-10-CM | POA: Diagnosis not present

## 2016-03-06 DIAGNOSIS — M25551 Pain in right hip: Secondary | ICD-10-CM | POA: Diagnosis not present

## 2016-03-10 DIAGNOSIS — M25559 Pain in unspecified hip: Secondary | ICD-10-CM | POA: Diagnosis not present

## 2016-03-12 DIAGNOSIS — M25551 Pain in right hip: Secondary | ICD-10-CM | POA: Diagnosis not present

## 2016-03-17 DIAGNOSIS — Z96641 Presence of right artificial hip joint: Secondary | ICD-10-CM | POA: Diagnosis not present

## 2016-03-17 DIAGNOSIS — Z471 Aftercare following joint replacement surgery: Secondary | ICD-10-CM | POA: Diagnosis not present

## 2016-03-17 DIAGNOSIS — M25551 Pain in right hip: Secondary | ICD-10-CM | POA: Diagnosis not present

## 2016-03-20 DIAGNOSIS — M25551 Pain in right hip: Secondary | ICD-10-CM | POA: Diagnosis not present

## 2016-03-23 DIAGNOSIS — M25551 Pain in right hip: Secondary | ICD-10-CM | POA: Diagnosis not present

## 2016-03-26 DIAGNOSIS — M25551 Pain in right hip: Secondary | ICD-10-CM | POA: Diagnosis not present

## 2016-03-31 DIAGNOSIS — M25551 Pain in right hip: Secondary | ICD-10-CM | POA: Diagnosis not present

## 2016-04-03 DIAGNOSIS — M25551 Pain in right hip: Secondary | ICD-10-CM | POA: Diagnosis not present

## 2016-04-06 DIAGNOSIS — Z961 Presence of intraocular lens: Secondary | ICD-10-CM | POA: Diagnosis not present

## 2016-04-06 DIAGNOSIS — H5711 Ocular pain, right eye: Secondary | ICD-10-CM | POA: Diagnosis not present

## 2016-04-07 DIAGNOSIS — M25551 Pain in right hip: Secondary | ICD-10-CM | POA: Diagnosis not present

## 2016-04-09 DIAGNOSIS — M25551 Pain in right hip: Secondary | ICD-10-CM | POA: Diagnosis not present

## 2016-04-14 DIAGNOSIS — M25551 Pain in right hip: Secondary | ICD-10-CM | POA: Diagnosis not present

## 2016-04-17 DIAGNOSIS — M25551 Pain in right hip: Secondary | ICD-10-CM | POA: Diagnosis not present

## 2016-05-13 DIAGNOSIS — R203 Hyperesthesia: Secondary | ICD-10-CM | POA: Diagnosis not present

## 2016-05-26 DIAGNOSIS — Z0000 Encounter for general adult medical examination without abnormal findings: Secondary | ICD-10-CM | POA: Diagnosis not present

## 2016-05-26 DIAGNOSIS — E7800 Pure hypercholesterolemia, unspecified: Secondary | ICD-10-CM | POA: Diagnosis not present

## 2016-05-26 DIAGNOSIS — M810 Age-related osteoporosis without current pathological fracture: Secondary | ICD-10-CM | POA: Diagnosis not present

## 2016-05-26 DIAGNOSIS — Z79899 Other long term (current) drug therapy: Secondary | ICD-10-CM | POA: Diagnosis not present

## 2016-06-04 DIAGNOSIS — I341 Nonrheumatic mitral (valve) prolapse: Secondary | ICD-10-CM | POA: Diagnosis not present

## 2016-06-04 DIAGNOSIS — M545 Low back pain: Secondary | ICD-10-CM | POA: Diagnosis not present

## 2016-06-04 DIAGNOSIS — M810 Age-related osteoporosis without current pathological fracture: Secondary | ICD-10-CM | POA: Diagnosis not present

## 2016-06-04 DIAGNOSIS — Z1389 Encounter for screening for other disorder: Secondary | ICD-10-CM | POA: Diagnosis not present

## 2016-06-04 DIAGNOSIS — Z0000 Encounter for general adult medical examination without abnormal findings: Secondary | ICD-10-CM | POA: Diagnosis not present

## 2016-06-04 DIAGNOSIS — Z23 Encounter for immunization: Secondary | ICD-10-CM | POA: Diagnosis not present

## 2016-06-04 DIAGNOSIS — E7800 Pure hypercholesterolemia, unspecified: Secondary | ICD-10-CM | POA: Diagnosis not present

## 2016-06-17 DIAGNOSIS — M76822 Posterior tibial tendinitis, left leg: Secondary | ICD-10-CM | POA: Diagnosis not present

## 2016-06-17 DIAGNOSIS — M19042 Primary osteoarthritis, left hand: Secondary | ICD-10-CM | POA: Diagnosis not present

## 2016-06-17 DIAGNOSIS — M5442 Lumbago with sciatica, left side: Secondary | ICD-10-CM | POA: Diagnosis not present

## 2016-06-18 DIAGNOSIS — Z1231 Encounter for screening mammogram for malignant neoplasm of breast: Secondary | ICD-10-CM | POA: Diagnosis not present

## 2016-06-18 DIAGNOSIS — R300 Dysuria: Secondary | ICD-10-CM | POA: Diagnosis not present

## 2016-06-18 DIAGNOSIS — N3020 Other chronic cystitis without hematuria: Secondary | ICD-10-CM | POA: Diagnosis not present

## 2016-07-06 DIAGNOSIS — M5442 Lumbago with sciatica, left side: Secondary | ICD-10-CM | POA: Diagnosis not present

## 2016-07-06 DIAGNOSIS — G8929 Other chronic pain: Secondary | ICD-10-CM | POA: Diagnosis not present

## 2016-07-14 DIAGNOSIS — M5442 Lumbago with sciatica, left side: Secondary | ICD-10-CM | POA: Diagnosis not present

## 2016-07-29 DIAGNOSIS — M79605 Pain in left leg: Secondary | ICD-10-CM | POA: Diagnosis not present

## 2016-08-11 DIAGNOSIS — M47816 Spondylosis without myelopathy or radiculopathy, lumbar region: Secondary | ICD-10-CM | POA: Diagnosis not present

## 2016-08-25 DIAGNOSIS — M47816 Spondylosis without myelopathy or radiculopathy, lumbar region: Secondary | ICD-10-CM | POA: Diagnosis not present

## 2016-09-08 DIAGNOSIS — M533 Sacrococcygeal disorders, not elsewhere classified: Secondary | ICD-10-CM | POA: Diagnosis not present

## 2016-09-08 DIAGNOSIS — Z23 Encounter for immunization: Secondary | ICD-10-CM | POA: Diagnosis not present

## 2016-09-14 DIAGNOSIS — E7800 Pure hypercholesterolemia, unspecified: Secondary | ICD-10-CM | POA: Diagnosis not present

## 2016-09-15 DIAGNOSIS — M8588 Other specified disorders of bone density and structure, other site: Secondary | ICD-10-CM | POA: Diagnosis not present

## 2016-09-15 DIAGNOSIS — M810 Age-related osteoporosis without current pathological fracture: Secondary | ICD-10-CM | POA: Diagnosis not present

## 2016-10-01 DIAGNOSIS — M533 Sacrococcygeal disorders, not elsewhere classified: Secondary | ICD-10-CM | POA: Diagnosis not present

## 2016-10-01 DIAGNOSIS — M47816 Spondylosis without myelopathy or radiculopathy, lumbar region: Secondary | ICD-10-CM | POA: Diagnosis not present

## 2016-10-12 DIAGNOSIS — H9313 Tinnitus, bilateral: Secondary | ICD-10-CM | POA: Diagnosis not present

## 2016-10-12 DIAGNOSIS — H905 Unspecified sensorineural hearing loss: Secondary | ICD-10-CM | POA: Diagnosis not present

## 2016-10-12 DIAGNOSIS — H903 Sensorineural hearing loss, bilateral: Secondary | ICD-10-CM | POA: Diagnosis not present

## 2016-10-12 DIAGNOSIS — H8103 Meniere's disease, bilateral: Secondary | ICD-10-CM | POA: Diagnosis not present

## 2017-01-11 DIAGNOSIS — E7800 Pure hypercholesterolemia, unspecified: Secondary | ICD-10-CM | POA: Diagnosis not present

## 2017-01-25 DIAGNOSIS — I341 Nonrheumatic mitral (valve) prolapse: Secondary | ICD-10-CM | POA: Diagnosis not present

## 2017-02-05 DIAGNOSIS — M7061 Trochanteric bursitis, right hip: Secondary | ICD-10-CM | POA: Diagnosis not present

## 2017-02-05 DIAGNOSIS — Z471 Aftercare following joint replacement surgery: Secondary | ICD-10-CM | POA: Diagnosis not present

## 2017-02-05 DIAGNOSIS — Z96641 Presence of right artificial hip joint: Secondary | ICD-10-CM | POA: Diagnosis not present

## 2017-03-29 DIAGNOSIS — L218 Other seborrheic dermatitis: Secondary | ICD-10-CM | POA: Diagnosis not present

## 2017-03-29 DIAGNOSIS — L308 Other specified dermatitis: Secondary | ICD-10-CM | POA: Diagnosis not present

## 2017-04-28 DIAGNOSIS — Z961 Presence of intraocular lens: Secondary | ICD-10-CM | POA: Diagnosis not present

## 2017-06-10 DIAGNOSIS — E161 Other hypoglycemia: Secondary | ICD-10-CM | POA: Diagnosis not present

## 2017-06-10 DIAGNOSIS — I779 Disorder of arteries and arterioles, unspecified: Secondary | ICD-10-CM | POA: Diagnosis not present

## 2017-06-10 DIAGNOSIS — Z0000 Encounter for general adult medical examination without abnormal findings: Secondary | ICD-10-CM | POA: Diagnosis not present

## 2017-06-10 DIAGNOSIS — Z1389 Encounter for screening for other disorder: Secondary | ICD-10-CM | POA: Diagnosis not present

## 2017-06-10 DIAGNOSIS — M810 Age-related osteoporosis without current pathological fracture: Secondary | ICD-10-CM | POA: Diagnosis not present

## 2017-06-10 DIAGNOSIS — I348 Other nonrheumatic mitral valve disorders: Secondary | ICD-10-CM | POA: Diagnosis not present

## 2017-06-10 DIAGNOSIS — H8103 Meniere's disease, bilateral: Secondary | ICD-10-CM | POA: Diagnosis not present

## 2017-06-10 DIAGNOSIS — E7800 Pure hypercholesterolemia, unspecified: Secondary | ICD-10-CM | POA: Diagnosis not present

## 2017-06-10 DIAGNOSIS — I511 Rupture of chordae tendineae, not elsewhere classified: Secondary | ICD-10-CM | POA: Diagnosis not present

## 2017-06-10 DIAGNOSIS — R5383 Other fatigue: Secondary | ICD-10-CM | POA: Diagnosis not present

## 2017-06-21 DIAGNOSIS — Z1231 Encounter for screening mammogram for malignant neoplasm of breast: Secondary | ICD-10-CM | POA: Diagnosis not present

## 2017-08-23 DIAGNOSIS — Z23 Encounter for immunization: Secondary | ICD-10-CM | POA: Diagnosis not present

## 2017-08-30 DIAGNOSIS — R350 Frequency of micturition: Secondary | ICD-10-CM | POA: Diagnosis not present

## 2017-08-30 DIAGNOSIS — N3020 Other chronic cystitis without hematuria: Secondary | ICD-10-CM | POA: Diagnosis not present

## 2017-10-01 DIAGNOSIS — H905 Unspecified sensorineural hearing loss: Secondary | ICD-10-CM | POA: Diagnosis not present

## 2017-10-01 DIAGNOSIS — H9313 Tinnitus, bilateral: Secondary | ICD-10-CM | POA: Diagnosis not present

## 2017-10-01 DIAGNOSIS — H903 Sensorineural hearing loss, bilateral: Secondary | ICD-10-CM | POA: Diagnosis not present

## 2017-10-01 DIAGNOSIS — H608X9 Other otitis externa, unspecified ear: Secondary | ICD-10-CM | POA: Diagnosis not present

## 2017-10-01 DIAGNOSIS — H8103 Meniere's disease, bilateral: Secondary | ICD-10-CM | POA: Diagnosis not present

## 2017-10-01 DIAGNOSIS — G43809 Other migraine, not intractable, without status migrainosus: Secondary | ICD-10-CM | POA: Diagnosis not present

## 2017-11-01 ENCOUNTER — Encounter (HOSPITAL_COMMUNITY): Payer: PPO

## 2017-11-01 ENCOUNTER — Ambulatory Visit (INDEPENDENT_AMBULATORY_CARE_PROVIDER_SITE_OTHER): Payer: PPO | Admitting: Family

## 2017-11-01 ENCOUNTER — Encounter: Payer: Self-pay | Admitting: Family

## 2017-11-01 ENCOUNTER — Ambulatory Visit (HOSPITAL_COMMUNITY)
Admission: RE | Admit: 2017-11-01 | Discharge: 2017-11-01 | Disposition: A | Discharge status: Home / Self Care | Payer: PPO | Source: Ambulatory Visit | Attending: Family | Admitting: Family

## 2017-11-01 ENCOUNTER — Ambulatory Visit: Payer: PPO | Admitting: Family

## 2017-11-01 VITALS — BP 133/81 | HR 75 | Temp 97.8°F | Resp 18 | Wt 123.0 lb

## 2017-11-01 DIAGNOSIS — I6521 Occlusion and stenosis of right carotid artery: Secondary | ICD-10-CM

## 2017-11-01 DIAGNOSIS — I6523 Occlusion and stenosis of bilateral carotid arteries: Secondary | ICD-10-CM | POA: Insufficient documentation

## 2017-11-01 LAB — VAS US CAROTID
LCCADDIAS: -21 cm/s
LEFT ECA DIAS: -13 cm/s
LEFT VERTEBRAL DIAS: 17 cm/s
LICADDIAS: -17 cm/s
LICADSYS: -60 cm/s
LICAPDIAS: -17 cm/s
LICAPSYS: -50 cm/s
Left CCA dist sys: -89 cm/s
Left CCA prox dias: 18 cm/s
Left CCA prox sys: 80 cm/s
RIGHT CCA MID DIAS: 20 cm/s
RIGHT ECA DIAS: -9 cm/s
Right CCA prox dias: 16 cm/s
Right CCA prox sys: 74 cm/s
Right cca dist sys: -193 cm/s

## 2017-11-01 NOTE — Progress Notes (Signed)
Chief Complaint: Follow up Extracranial Carotid Artery Stenosis   History of Present Illness  Lori Frost is a 71 y.o. female whom Dr. Myra GianottiBrabham has evaluated, and comes in today for evaluation of her carotid occlusive disease. She had undergone carotid duplex study during a workup for her dizziness. This found less than 50% right carotid stenosis. The patient's difficulties date back to approximately 2011 when she had a vertigo-like episode; she's undergone multiple tests and referrals to evaluate this problem. She has been diagnosed with possible vestibular neuritis, vestibular migraine, these conditions are under management at [facility]. She continues to suffers from balance issues and dizziness.   The patient denies any specific symptoms related to carotid occlusive disease. Specifically she denies numbness or weakness in either extremity. She denies slurred speech she denies amaurosis fugax  She does have hypercholesterolemia. She is intolerant of statin medications.  She has a strong family history for premature cardiovascular disease.  She has not had previous carotid artery intervention. She is aware of her heart murmur, mitral valve prolapse, states this is monitored by her cardiologist.   She had a right hip replacement in March 2017 and is doing well after this.  She states arthritis in her back may be causing right leg sciatic type pain and left hip pain.   She was diagnosed with osteoporosis and was started on Fosamax.   Pt Diabetic: No Pt smoker: non-smoker  Pt meds include: Statin : No: statins caused myalgias and arthralgias ASA: Yes  Other anticoagulants/antiplatelets: no   Past Medical History:  Diagnosis Date  . Allergy    Non-Allergic Rhinitis  . Balance problem due to vestibular dysfunction   . Carotid artery occlusion   . Cyst of ovary, right   . Diverticulosis   . DJD (degenerative joint disease)   . Eczema   . Family history of adverse reaction  to anesthesia    SISTER "HEART STOPPED DURING [id number]"1992 UNKNOWN CAUSE -  SHE DIED 10 YRS LATER OF HEART ATTACK  . History of hiatal hernia   . History of recurrent UTI (urinary tract infection)   . Hyperlipidemia   . Meniere's disease   . Migraine   . MVP (mitral valve prolapse)   . Myalgia   . Osteoporosis   . Pupil dilated    DUE TO EYE DROPS FOR TX OF RT  EYE PROBLEM  . Shingles   . Vertigo   . Vestibular neuronitis     Social History Social History   Tobacco Use  . Smoking status: Never Smoker  . Smokeless tobacco: Never Used  Substance Use Topics  . Alcohol use: No    Alcohol/week: 0.0 oz  . Drug use: No    Family History Family History  Problem Relation Age of Onset  . Heart attack Mother   . Diabetes Mother   . Diabetes Father   . Heart attack Sister   . Cancer Sister     Surgical History Past Surgical History:  Procedure Laterality Date  . EYE SURGERY Right 02-05-15   Cataract  . EYE SURGERY Left 02-21-15   Cataract  . RIGHT TOTAL HIP ARTHROPLASTY ANTERIOR APPROACH Right 02/12/2016   Performed by Ollen GrossAluisio, Frank, MD [facility]  . TONSILLECTOMY      Allergies  Allergen Reactions  . Acetazolamide Other (See Comments)    Lethargy and worsened aura symptoms. Lethargy and worsened aura symptoms.  . Codeine Nausea Only  . Sertraline Hcl     Made her  antsy"  . Statins     Joint pain.   . Tape Dermatitis    Prefers paper tape  . Topamax [Topiramate]   . Tramadol Itching    Current Outpatient Medications  Medication Sig Dispense Refill  . acetaminophen (TYLENOL) 325 MG tablet Take 2 tablets (650 mg total) by mouth every 6 (six) hours as needed for mild pain (or Fever >/= 101). 20 tablet 0  . alendronate (FOSAMAX) 70 MG tablet 1 TABLET ONCE A WEEK SATURDAYS ORALLY 90 DAYS  3  . alendronate (FOSAMAX) 70 MG tablet Take by mouth.    Marland Kitchen aspirin 81 MG chewable tablet Chew 81 mg by mouth daily. Give 1 tablet by mouth daily once XARELTO has completed 2 week  dose    . calcium carbonate (CALCIUM 600) 600 MG TABS tablet Take by mouth.    . cetirizine (ZYRTEC) 10 MG tablet Take by mouth.    . Cholecalciferol (VITAMIN D3) 1000 units CAPS Take by mouth.    . diazepam (VALIUM) 2 MG tablet Take 2 mg by mouth every 8 (eight) hours as needed. Vertigo.    . diphenhydrAMINE (BENADRYL) 12.5 MG/5ML elixir Take 5-10 mLs (12.5-25 mg total) by mouth every 4 (four) hours as needed for itching. 120 mL 0  . Ezetimibe (ZETIA PO) Take once a week by mouth.    . ranitidine (ZANTAC) 75 MG tablet Take 1 tablet (75 mg total) by mouth 2 (two) times daily. 60 tablet 0  . triamterene-hydrochlorothiazide (MAXZIDE-25) 37.5-25 MG tablet Take 1 tablet by mouth daily.  4  . trimethoprim (TRIMPEX) 100 MG tablet Take 100 mg by mouth daily.  2   No current facility-administered medications for this visit.     Review of Systems : See HPI for pertinent positives and negatives.  Physical Examination  Vitals:   11/01/17 1511 11/01/17 1513  BP: (!) 144/81 133/81  Pulse: 75   Resp: 18   Temp: 97.8 F (36.6 C)   TempSrc: Oral   SpO2: 96%   Weight: 123 lb (55.8 kg)    Body mass index is 23.24 kg/m.  General: WDWN female in NAD GAIT: normal Eyes: PERRLA Pulmonary: Respirations are non-labored, CTAB, no rales, rhonchi, or wheezing.  Cardiac: regular rhythm, + murmur.  VASCULAR EXAM Carotid Bruits Right Left    no bruit Transmitted cardiac murmur   Abdominal aortic pulse is not palpable Radial pulses are 2+ palpable and equal.      LE Pulses Right Left   POPLITEAL not palpable  not palpable   POSTERIOR TIBIAL  palpable   palpable    DORSALIS PEDIS  ANTERIOR TIBIAL palpable  palpable     Gastrointestinal: soft, nontender, BS WNL, no r/g, no palpable masses.  Musculoskeletal:  Negative muscle atrophy/wasting. M/S 5/5 throughout except 4/5 in right LE, Extremities without ischemic changes.  Neurologic: A&O X 3; appropriate affect, speech is normal, CN 2-12 intact, Pain and light touch intact in extremities, Motor exam as listed .     Assessment: Lori Frost is a 71 y.o. female who has no history of stroke or TIA.  Carotid Duplex(11/01/17): 1-39% bilateral ICA stenosis.  Bilateral vertebral artery flow is antegrade.  Bilateral subclavian artery waveforms are normal.  No significant change since every carotid duplex dating back to 2012.    Plan:  Follow-up in 3 years with Carotid Duplex scan  I discussed in depth with the patient the nature of atherosclerosis, and emphasized the importance of maximal medical management including strict  control of blood pressure, blood glucose, and lipid levels, obtaining regular exercise, and continued cessation of smoking.  The patient is aware that without maximal medical management the underlying atherosclerotic disease process will progress, limiting the benefit of any interventions. The patient was given information about stroke prevention and what symptoms should prompt the patient to seek immediate medical care. Thank you for allowing us to participate in this patient's care.  Charisse MarchSuzanne Danyon Mcginness, RN, MSN, FNP-C [facility] Office: [phone]  Clinic Physician: Myra GianottiBrabham  11/01/17 3:29 PM

## 2017-11-01 NOTE — Patient Instructions (Signed)

## 2017-11-29 DIAGNOSIS — E7800 Pure hypercholesterolemia, unspecified: Secondary | ICD-10-CM | POA: Diagnosis not present

## 2017-12-02 DIAGNOSIS — L308 Other specified dermatitis: Secondary | ICD-10-CM | POA: Diagnosis not present

## 2017-12-02 DIAGNOSIS — L820 Inflamed seborrheic keratosis: Secondary | ICD-10-CM | POA: Diagnosis not present

## 2017-12-02 DIAGNOSIS — L218 Other seborrheic dermatitis: Secondary | ICD-10-CM | POA: Diagnosis not present

## 2018-01-31 DIAGNOSIS — I341 Nonrheumatic mitral (valve) prolapse: Secondary | ICD-10-CM | POA: Diagnosis not present

## 2018-01-31 DIAGNOSIS — E7849 Other hyperlipidemia: Secondary | ICD-10-CM | POA: Diagnosis not present

## 2018-02-03 DIAGNOSIS — M1611 Unilateral primary osteoarthritis, right hip: Secondary | ICD-10-CM | POA: Diagnosis not present

## 2018-02-03 DIAGNOSIS — Z471 Aftercare following joint replacement surgery: Secondary | ICD-10-CM | POA: Diagnosis not present

## 2018-02-03 DIAGNOSIS — M545 Low back pain: Secondary | ICD-10-CM | POA: Diagnosis not present

## 2018-02-03 DIAGNOSIS — Z96641 Presence of right artificial hip joint: Secondary | ICD-10-CM | POA: Diagnosis not present

## 2018-02-15 DIAGNOSIS — M5136 Other intervertebral disc degeneration, lumbar region: Secondary | ICD-10-CM | POA: Diagnosis not present

## 2018-04-27 DIAGNOSIS — Z961 Presence of intraocular lens: Secondary | ICD-10-CM | POA: Diagnosis not present

## 2018-04-27 DIAGNOSIS — Z9842 Cataract extraction status, left eye: Secondary | ICD-10-CM | POA: Diagnosis not present

## 2018-04-27 DIAGNOSIS — Z9841 Cataract extraction status, right eye: Secondary | ICD-10-CM | POA: Diagnosis not present

## 2018-05-02 DIAGNOSIS — L308 Other specified dermatitis: Secondary | ICD-10-CM | POA: Diagnosis not present

## 2018-05-02 DIAGNOSIS — L218 Other seborrheic dermatitis: Secondary | ICD-10-CM | POA: Diagnosis not present

## 2018-06-23 DIAGNOSIS — E559 Vitamin D deficiency, unspecified: Secondary | ICD-10-CM | POA: Diagnosis not present

## 2018-06-23 DIAGNOSIS — E7800 Pure hypercholesterolemia, unspecified: Secondary | ICD-10-CM | POA: Diagnosis not present

## 2018-06-28 DIAGNOSIS — I511 Rupture of chordae tendineae, not elsewhere classified: Secondary | ICD-10-CM | POA: Diagnosis not present

## 2018-06-28 DIAGNOSIS — E559 Vitamin D deficiency, unspecified: Secondary | ICD-10-CM | POA: Diagnosis not present

## 2018-06-28 DIAGNOSIS — H8103 Meniere's disease, bilateral: Secondary | ICD-10-CM | POA: Diagnosis not present

## 2018-06-28 DIAGNOSIS — I779 Disorder of arteries and arterioles, unspecified: Secondary | ICD-10-CM | POA: Diagnosis not present

## 2018-06-28 DIAGNOSIS — Z1239 Encounter for other screening for malignant neoplasm of breast: Secondary | ICD-10-CM | POA: Diagnosis not present

## 2018-06-28 DIAGNOSIS — Z1389 Encounter for screening for other disorder: Secondary | ICD-10-CM | POA: Diagnosis not present

## 2018-06-28 DIAGNOSIS — Z0000 Encounter for general adult medical examination without abnormal findings: Secondary | ICD-10-CM | POA: Diagnosis not present

## 2018-06-28 DIAGNOSIS — M8588 Other specified disorders of bone density and structure, other site: Secondary | ICD-10-CM | POA: Diagnosis not present

## 2018-06-28 DIAGNOSIS — E7800 Pure hypercholesterolemia, unspecified: Secondary | ICD-10-CM | POA: Diagnosis not present

## 2018-06-28 DIAGNOSIS — I341 Nonrheumatic mitral (valve) prolapse: Secondary | ICD-10-CM | POA: Diagnosis not present

## 2018-06-30 ENCOUNTER — Other Ambulatory Visit: Payer: Self-pay | Admitting: Family Medicine

## 2018-06-30 DIAGNOSIS — Z1231 Encounter for screening mammogram for malignant neoplasm of breast: Secondary | ICD-10-CM

## 2018-07-28 ENCOUNTER — Ambulatory Visit
Admission: RE | Admit: 2018-07-28 | Discharge: 2018-07-28 | Disposition: A | Discharge status: Home / Self Care | Payer: PPO | Source: Ambulatory Visit | Attending: Family Medicine | Admitting: Family Medicine

## 2018-07-28 DIAGNOSIS — Z1231 Encounter for screening mammogram for malignant neoplasm of breast: Secondary | ICD-10-CM

## 2018-08-30 DIAGNOSIS — L309 Dermatitis, unspecified: Secondary | ICD-10-CM | POA: Diagnosis not present

## 2018-09-01 DIAGNOSIS — Z1159 Encounter for screening for other viral diseases: Secondary | ICD-10-CM | POA: Diagnosis not present

## 2018-09-01 DIAGNOSIS — E7800 Pure hypercholesterolemia, unspecified: Secondary | ICD-10-CM | POA: Diagnosis not present

## 2018-09-01 DIAGNOSIS — R21 Rash and other nonspecific skin eruption: Secondary | ICD-10-CM | POA: Diagnosis not present

## 2018-09-05 DIAGNOSIS — L920 Granuloma annulare: Secondary | ICD-10-CM | POA: Diagnosis not present

## 2018-09-13 DIAGNOSIS — M8588 Other specified disorders of bone density and structure, other site: Secondary | ICD-10-CM | POA: Diagnosis not present

## 2018-09-16 DIAGNOSIS — Z23 Encounter for immunization: Secondary | ICD-10-CM | POA: Diagnosis not present

## 2018-10-03 DIAGNOSIS — K219 Gastro-esophageal reflux disease without esophagitis: Secondary | ICD-10-CM | POA: Diagnosis not present

## 2018-11-07 DIAGNOSIS — Z1159 Encounter for screening for other viral diseases: Secondary | ICD-10-CM | POA: Diagnosis not present

## 2018-11-07 DIAGNOSIS — R21 Rash and other nonspecific skin eruption: Secondary | ICD-10-CM | POA: Diagnosis not present

## 2018-11-07 DIAGNOSIS — E7800 Pure hypercholesterolemia, unspecified: Secondary | ICD-10-CM | POA: Diagnosis not present

## 2018-11-24 DIAGNOSIS — Z79899 Other long term (current) drug therapy: Secondary | ICD-10-CM | POA: Diagnosis not present

## 2018-11-24 DIAGNOSIS — H6123 Impacted cerumen, bilateral: Secondary | ICD-10-CM | POA: Diagnosis not present

## 2018-11-24 DIAGNOSIS — H608X9 Other otitis externa, unspecified ear: Secondary | ICD-10-CM | POA: Diagnosis not present

## 2018-11-24 DIAGNOSIS — G43109 Migraine with aura, not intractable, without status migrainosus: Secondary | ICD-10-CM | POA: Diagnosis not present

## 2018-11-24 DIAGNOSIS — H9313 Tinnitus, bilateral: Secondary | ICD-10-CM | POA: Diagnosis not present

## 2018-11-24 DIAGNOSIS — H903 Sensorineural hearing loss, bilateral: Secondary | ICD-10-CM | POA: Diagnosis not present

## 2018-11-24 DIAGNOSIS — H8103 Meniere's disease, bilateral: Secondary | ICD-10-CM | POA: Diagnosis not present

## 2019-01-03 ENCOUNTER — Other Ambulatory Visit: Payer: Self-pay | Admitting: *Deleted

## 2019-01-03 ENCOUNTER — Telehealth: Payer: Self-pay | Admitting: Cardiology

## 2019-01-03 DIAGNOSIS — I341 Nonrheumatic mitral (valve) prolapse: Secondary | ICD-10-CM

## 2019-01-03 DIAGNOSIS — I340 Nonrheumatic mitral (valve) insufficiency: Secondary | ICD-10-CM

## 2019-01-03 NOTE — Telephone Encounter (Signed)
Please put in an order for an echo so she can have one prior to her appt on 02/10/2019. Thank you

## 2019-01-03 NOTE — Telephone Encounter (Signed)
Echocardiogram order has been placed. Please schedule.

## 2019-01-05 ENCOUNTER — Telehealth: Payer: Self-pay | Admitting: Cardiology

## 2019-01-05 NOTE — Telephone Encounter (Signed)
Patient cancelled doctor appt on 02/10/2019 with Dr. Dulce Sellar and is speaking to her PCP regarding appt and echo. Echo order is in system.

## 2019-01-25 DIAGNOSIS — N3020 Other chronic cystitis without hematuria: Secondary | ICD-10-CM | POA: Diagnosis not present

## 2019-01-26 DIAGNOSIS — M25512 Pain in left shoulder: Secondary | ICD-10-CM | POA: Diagnosis not present

## 2019-02-10 ENCOUNTER — Ambulatory Visit: Payer: PPO | Admitting: Cardiology

## 2019-02-16 DIAGNOSIS — E7800 Pure hypercholesterolemia, unspecified: Secondary | ICD-10-CM | POA: Diagnosis not present

## 2019-02-24 ENCOUNTER — Encounter: Payer: Self-pay | Admitting: Cardiology

## 2019-03-01 ENCOUNTER — Other Ambulatory Visit (HOSPITAL_COMMUNITY): Payer: PPO

## 2019-03-07 ENCOUNTER — Ambulatory Visit: Payer: PPO | Admitting: Cardiology

## 2019-03-09 DIAGNOSIS — R351 Nocturia: Secondary | ICD-10-CM | POA: Diagnosis not present

## 2019-03-09 DIAGNOSIS — N3020 Other chronic cystitis without hematuria: Secondary | ICD-10-CM | POA: Diagnosis not present

## 2019-03-09 DIAGNOSIS — R350 Frequency of micturition: Secondary | ICD-10-CM | POA: Diagnosis not present

## 2019-04-06 ENCOUNTER — Telehealth: Payer: Self-pay | Admitting: Cardiovascular Disease

## 2019-04-06 NOTE — Telephone Encounter (Signed)
   Primary Cardiologist:  Laverda Sorenson patient to discuss  Long hx of MVP No symptoms  Has had yearly echos in the past  She has seen skains in the past Has seen Donnie Aho more recently , has had yearly echos   Occasional palpitations  No dyspnea Able to do all of her normal activities   OK to reschedule echo  I think it would be beneficial to also delay her visit with Dr. Anne Fu until after the echo   Priority level 3.     Patient contacted.  History reviewed.  No symptoms to suggest any unstable cardiac conditions.  Based on discussion, with current pandemic situation, we will be postponing this appointment for Mick Sell with a plan for f/u in  12  wks or sooner if feasible/necessary.  If symptoms change, she has been instructed to contact our office.   Routing to C19 CANCEL pool for tracking (P CV DIV CV19 CANCEL - reason for visit "other.") and assigning priority (  3 = >12 wks).   Kristeen Miss, MD  04/06/2019 10:26 AM         .

## 2019-04-13 ENCOUNTER — Other Ambulatory Visit (HOSPITAL_COMMUNITY): Payer: PPO

## 2019-04-26 ENCOUNTER — Ambulatory Visit: Payer: PPO | Admitting: Cardiology

## 2019-06-14 DIAGNOSIS — H524 Presbyopia: Secondary | ICD-10-CM | POA: Diagnosis not present

## 2019-06-14 DIAGNOSIS — Z961 Presence of intraocular lens: Secondary | ICD-10-CM | POA: Diagnosis not present

## 2019-06-14 DIAGNOSIS — H43393 Other vitreous opacities, bilateral: Secondary | ICD-10-CM | POA: Diagnosis not present

## 2019-06-19 ENCOUNTER — Other Ambulatory Visit: Payer: Self-pay

## 2019-06-19 ENCOUNTER — Ambulatory Visit (HOSPITAL_COMMUNITY): Payer: PPO | Attending: Internal Medicine

## 2019-06-19 DIAGNOSIS — I341 Nonrheumatic mitral (valve) prolapse: Secondary | ICD-10-CM | POA: Diagnosis not present

## 2019-06-19 DIAGNOSIS — I340 Nonrheumatic mitral (valve) insufficiency: Secondary | ICD-10-CM | POA: Insufficient documentation

## 2019-06-21 DIAGNOSIS — R110 Nausea: Secondary | ICD-10-CM | POA: Diagnosis not present

## 2019-06-21 DIAGNOSIS — H8103 Meniere's disease, bilateral: Secondary | ICD-10-CM | POA: Diagnosis not present

## 2019-06-21 DIAGNOSIS — R51 Headache: Secondary | ICD-10-CM | POA: Diagnosis not present

## 2019-06-21 DIAGNOSIS — R42 Dizziness and giddiness: Secondary | ICD-10-CM | POA: Diagnosis not present

## 2019-07-05 DIAGNOSIS — M810 Age-related osteoporosis without current pathological fracture: Secondary | ICD-10-CM | POA: Diagnosis not present

## 2019-07-05 DIAGNOSIS — E559 Vitamin D deficiency, unspecified: Secondary | ICD-10-CM | POA: Diagnosis not present

## 2019-07-05 DIAGNOSIS — E7800 Pure hypercholesterolemia, unspecified: Secondary | ICD-10-CM | POA: Diagnosis not present

## 2019-07-10 DIAGNOSIS — R7989 Other specified abnormal findings of blood chemistry: Secondary | ICD-10-CM | POA: Diagnosis not present

## 2019-07-10 DIAGNOSIS — M810 Age-related osteoporosis without current pathological fracture: Secondary | ICD-10-CM | POA: Diagnosis not present

## 2019-07-10 DIAGNOSIS — Z131 Encounter for screening for diabetes mellitus: Secondary | ICD-10-CM | POA: Diagnosis not present

## 2019-07-10 DIAGNOSIS — K219 Gastro-esophageal reflux disease without esophagitis: Secondary | ICD-10-CM | POA: Diagnosis not present

## 2019-07-10 DIAGNOSIS — E559 Vitamin D deficiency, unspecified: Secondary | ICD-10-CM | POA: Diagnosis not present

## 2019-07-10 DIAGNOSIS — E7800 Pure hypercholesterolemia, unspecified: Secondary | ICD-10-CM | POA: Diagnosis not present

## 2019-07-13 DIAGNOSIS — L508 Other urticaria: Secondary | ICD-10-CM | POA: Diagnosis not present

## 2019-07-19 DIAGNOSIS — L508 Other urticaria: Secondary | ICD-10-CM | POA: Diagnosis not present

## 2019-07-19 DIAGNOSIS — B86 Scabies: Secondary | ICD-10-CM | POA: Diagnosis not present

## 2019-07-19 DIAGNOSIS — L218 Other seborrheic dermatitis: Secondary | ICD-10-CM | POA: Diagnosis not present

## 2019-07-27 DIAGNOSIS — E7800 Pure hypercholesterolemia, unspecified: Secondary | ICD-10-CM | POA: Diagnosis not present

## 2019-07-27 DIAGNOSIS — M810 Age-related osteoporosis without current pathological fracture: Secondary | ICD-10-CM | POA: Diagnosis not present

## 2019-07-27 DIAGNOSIS — E559 Vitamin D deficiency, unspecified: Secondary | ICD-10-CM | POA: Diagnosis not present

## 2019-07-27 DIAGNOSIS — Z131 Encounter for screening for diabetes mellitus: Secondary | ICD-10-CM | POA: Diagnosis not present

## 2019-07-27 DIAGNOSIS — K219 Gastro-esophageal reflux disease without esophagitis: Secondary | ICD-10-CM | POA: Diagnosis not present

## 2019-07-27 DIAGNOSIS — R7989 Other specified abnormal findings of blood chemistry: Secondary | ICD-10-CM | POA: Diagnosis not present

## 2019-07-27 DIAGNOSIS — R945 Abnormal results of liver function studies: Secondary | ICD-10-CM | POA: Diagnosis not present

## 2019-08-02 DIAGNOSIS — L508 Other urticaria: Secondary | ICD-10-CM | POA: Diagnosis not present

## 2019-08-15 ENCOUNTER — Ambulatory Visit: Payer: PPO | Admitting: Cardiology

## 2019-08-15 ENCOUNTER — Other Ambulatory Visit: Payer: Self-pay

## 2019-08-15 ENCOUNTER — Encounter: Payer: Self-pay | Admitting: Cardiology

## 2019-08-15 VITALS — BP 126/70 | HR 73 | Ht 61.0 in | Wt 112.8 lb

## 2019-08-15 DIAGNOSIS — I340 Nonrheumatic mitral (valve) insufficiency: Secondary | ICD-10-CM | POA: Diagnosis not present

## 2019-08-15 DIAGNOSIS — I341 Nonrheumatic mitral (valve) prolapse: Secondary | ICD-10-CM

## 2019-08-15 DIAGNOSIS — I6521 Occlusion and stenosis of right carotid artery: Secondary | ICD-10-CM | POA: Diagnosis not present

## 2019-08-15 DIAGNOSIS — Z8249 Family history of ischemic heart disease and other diseases of the circulatory system: Secondary | ICD-10-CM | POA: Diagnosis not present

## 2019-08-15 NOTE — Progress Notes (Signed)
Cardiology Office Note:    Date:  08/15/2019   ID:  Lori Frost, DOB 1945/12/16, MRN [id number]  PCP:  Juluis RainierBarnes, Elizabeth, MD  Cardiologist:  Donato SchultzMark Leonte Horrigan, MD  Electrophysiologist:  None   Referring MD: Juluis RainierBarnes, Elizabeth, MD     History of Present Illness:    Lori Frost is a 73 y.o. female former patient of Dr. Donnie Ahoilley with coronary artery disease here for follow-up.  Last seen by Dr. Donnie Ahoilley on 01/31/2018 with mitral valve regurgitation mitral valve prolapse (1978 first dx)carotid artery disease and hyperlipidemia.  She was allergic to codeine-nausea Crestor/muscle aches Lipitor/muscle aches Topamax/fatigue tramadol rash Zoloft intolerance  She was doing fairly well limited by her hip pain.  Occasional fatigue with walking long distances.  No significant shortness of breath or angina.  No claudication.  No PND. Most of her life, palps. Rare.   Echocardiogram in 2019 demonstrated moderate eccentric mitral regurgitation with a previously partial ruptured chorda.  Chamber dimensions were normal.  LVEF 60%.  She is also had a history of vertigo migraines Mnire's disease.  Prior LDL 144.    Past Medical History:  Diagnosis Date  . Allergy    Non-Allergic Rhinitis  . Balance problem due to vestibular dysfunction   . Bloating   . CAD (coronary artery disease)   . Carotid artery occlusion   . Cataract   . Chordae tendineae rupture (HCC)   . Cyst of ovary, right   . Depression   . Diverticulosis   . DJD (degenerative joint disease)   . Eczema   . Family history of adverse reaction to anesthesia    SISTER "HEART STOPPED DURING [id number]"1992 UNKNOWN CAUSE -  SHE DIED 10 YRS LATER OF HEART ATTACK  . GERD (gastroesophageal reflux disease)    ESOPHAGITIS PRESENCE NOT SPECIFIED  . Granuloma annulare   . History of hiatal hernia   . History of recurrent UTI (urinary tract infection)   . Hypercholesterolemia   . Hyperlipidemia   . Lower back pain   . Meniere's disease   .  Migraine   . Mitral valve regurgitation    MODERATE NORMAL, MILD AORTIC REGURGITATION, MILD TRICUSPID REGURGITATION, LVH, GRADE 1 DIASTOLIC DYSFUNCTION, ECHO 2018 SUGGESTS RUPTURED CHORDAE TENDINEAE ( DR. Donnie AhoILLEY)  . MVP (mitral valve prolapse)   . Myalgia   . OA (osteoarthritis) of hip 02/12/2016  . Osteoporosis   . Other nonrheumatic mitral valve disorders   . Psoriasis   . Pupil dilated    DUE TO EYE DROPS FOR TX OF RT  EYE PROBLEM  . Reactive hypoglycemia   . Sensitive skin   . Shingles   . Vertigo   . Vestibular neuronitis   . Vitamin D deficiency     Past Surgical History:  Procedure Laterality Date  . EYE SURGERY Right 02-05-15   Cataract  . EYE SURGERY Left 02-21-15   Cataract  . TONSILLECTOMY    . TOTAL HIP ARTHROPLASTY Right 02/12/2016   Procedure: RIGHT TOTAL HIP ARTHROPLASTY ANTERIOR APPROACH;  Surgeon: Ollen GrossFrank Aluisio, MD;  Location: [facility];  Service: Orthopedics;  Laterality: Right;    Current Medications: Current Meds  Medication Sig  . acetaminophen (TYLENOL) 325 MG tablet Take 2 tablets (650 mg total) by mouth every 6 (six) hours as needed for mild pain (or Fever >/= 101).  Marland Kitchen. alendronate (FOSAMAX) 70 MG tablet 1 TABLET ONCE A WEEK SATURDAYS ORALLY 90 DAYS  . alendronate (FOSAMAX) 70 MG tablet Take by mouth.  Marland Kitchen. aspirin 81  MG chewable tablet Chew 81 mg by mouth daily. Give 1 tablet by mouth daily once XARELTO has completed 2 week dose  . calcium carbonate (CALCIUM 600) 600 MG TABS tablet Take by mouth.  . cetirizine (ZYRTEC) 10 MG tablet Take by mouth.  . Cholecalciferol (VITAMIN D3) 1000 units CAPS Take by mouth.  . diazepam (VALIUM) 2 MG tablet Take 2 mg by mouth every 8 (eight) hours as needed. Vertigo.  . diphenhydrAMINE (BENADRYL) 12.5 MG/5ML elixir Take 5-10 mLs (12.5-25 mg total) by mouth every 4 (four) hours as needed for itching.  . Ezetimibe (ZETIA PO) Take once a week by mouth.  . famotidine (PEPCID) 10 MG tablet Take 10 mg by mouth 2 (two) times daily.  Marland Kitchen  triamterene-hydrochlorothiazide (MAXZIDE-25) 37.5-25 MG tablet Take 1 tablet by mouth daily.  Marland Kitchen trimethoprim (TRIMPEX) 100 MG tablet Take 100 mg by mouth daily.     Allergies:   Acetazolamide, Codeine, Sertraline hcl, Statins, Tape, Topamax [topiramate], and Tramadol   Social History   Socioeconomic History  . Marital status: Single    Spouse name: Not on file  . Number of children: Not on file  . Years of education: Not on file  . Highest education level: Not on file  Occupational History  . Not on file  Social Needs  . Financial resource strain: Not on file  . Food insecurity    Worry: Not on file    Inability: Not on file  . Transportation needs    Medical: Not on file    Non-medical: Not on file  Tobacco Use  . Smoking status: Never Smoker  . Smokeless tobacco: Never Used  Substance and Sexual Activity  . Alcohol use: No    Alcohol/week: 0.0 standard drinks  . Drug use: No  . Sexual activity: Not on file  Lifestyle  . Physical activity    Days per week: Not on file    Minutes per session: Not on file  . Stress: Not on file  Relationships  . Social Musician on phone: Not on file    Gets together: Not on file    Attends religious service: Not on file    Active member of club or organization: Not on file    Attends meetings of clubs or organizations: Not on file    Relationship status: Not on file  Other Topics Concern  . Not on file  Social History Narrative  . Not on file     Family History: The patient's family history includes Cancer in her sister; Diabetes in her father and mother; Heart attack in her mother and sister.71 yo  ROS:   Please see the history of present illness.    Denies any fevers chills nausea vomiting syncope bleeding all other systems reviewed and are negative.  EKGs/Labs/Other Studies Reviewed:    The following studies were reviewed today: Echocardiogram 06/19/2019:  1. The mitral valve is abnormal. There is mild mitral  annular calcification present. Mitral valve regurgitation is moderate to severe by color flow Doppler.  2. Mitral valve prolapse, bileaflet however primarily involving the posterior leaflet with anterior leaflet override. Mitral regurgitant jet is eccentric, and anteriorly directed.  3. The left ventricle has normal systolic function with an ejection fraction of 60-65%. The cavity size was normal. There is mildly increased left ventricular wall thickness. Left ventricular diastolic Doppler parameters are consistent with impaired  relaxation.  4. The right ventricle has normal systolic function. The cavity was normal.  There is no increase in right ventricular wall thickness. Right ventricular systolic pressure is normal.  5. The aortic valve is tricuspid. Aortic valve regurgitation is trivial by color flow Doppler.  6. There is mild dilatation of the ascending aorta and of the aortic root measuring 38 mm.  EKG:  EKG is  ordered today.  The ekg ordered today demonstrates sinus rhythm 73 with left axis deviation personally reviewed.  Recent Labs: No results found for requested labs within last 8760 hours.  Recent Lipid Panel No results found for: CHOL, TRIG, HDL, CHOLHDL, VLDL, LDLCALC, LDLDIRECT  Physical Exam:    VS:  BP 126/70   Pulse 73   Ht 5\' 1"  (1.549 m)   Wt 112 lb 12.8 oz (51.2 kg)   SpO2 98%   BMI 21.31 kg/m     Wt Readings from Last 3 Encounters:  08/15/19 112 lb 12.8 oz (51.2 kg)  11/01/17 123 lb (55.8 kg)  02/21/16 122 lb 6 oz (55.5 kg)     GEN:  Well nourished, well developed in no acute distress HEENT: Normal NECK: No JVD; No carotid bruits LYMPHATICS: No lymphadenopathy CARDIAC: RRR, 2/6 systolic murmur-mitral regurgitation, no rubs, gallops RESPIRATORY:  Clear to auscultation without rales, wheezing or rhonchi  ABDOMEN: Soft, non-tender, non-distended MUSCULOSKELETAL:  No edema; No deformity  SKIN: Warm and dry NEUROLOGIC:  Alert and oriented x 3 PSYCHIATRIC:   Normal affect   ASSESSMENT:    1. Moderate mitral regurgitation   2. Mitral prolapse   3. Stenosis of right carotid artery   4. Family history of cardiovascular disease    PLAN:    In order of problems listed above:  Mitral valve prolapse/mitral regurgitation - Moderate eccentric mitral regurgitation with ruptured chordae seen in 01/31/2018.  It was once again graded at moderate, perhaps moderate to severe in 2020.  She is not have any symptoms.  No dilated cardiac chamber sizes.  Mild aortic regurgitation also seen.  We will monitor.  Echocardiogram in 1 year.  Obviously if symptoms change she will let us know.   Hyperlipidemia/statin intolerance -original LDL was 190.  Currently on Zetia. 151  Family history of CAD - Sister at 81 had bypass.  I would like for her to get a coronary calcium score to see if she has any advanced atherosclerotic process in place.  She is not having any active chest discomfort.  If she does have a significant amount of coronary calcium based upon her score, she may benefit from a stress test or at the least talking with her lipid clinic to discuss more advanced therapies for lipid control since she has statin intolerance.  Carotid artery disease -Bilateral, mild.    Medication Adjustments/Labs and Tests Ordered: Current medicines are reviewed at length with the patient today.  Concerns regarding medicines are outlined above.  Orders Placed This Encounter  Procedures  . CT CARDIAC SCORING  . ECHOCARDIOGRAM COMPLETE   No orders of the defined types were placed in this encounter.   Patient Instructions  Medication Instructions:  Your physician recommends that you continue on your current medications as directed. Please refer to the Current Medication list given to you today.  If you need a refill on your cardiac medications before your next appointment, please call your pharmacy.   Lab work: None If you have labs (blood work) drawn today and your  tests are completely normal, you will receive your results only by: Marland Kitchen MyChart Message (if you have MyChart) OR .  A paper copy in the mail If you have any lab test that is abnormal or we need to change your treatment, we will call you to review the results.  Testing/Procedures: Your physician has requested that you have an echocardiogram in 1 year, before seeing Dr. Anne Fu. Echocardiography is a painless test that uses sound waves to create images of your heart. It provides your doctor with information about the size and shape of your heart and how well your heart's chambers and valves are working. This procedure takes approximately one hour. There are no restrictions for this procedure.  Your physician has requested that you have cardiac CT Scoring Cardiac computed tomography (CT) is a painless test that uses an x-ray machine to take clear, detailed pictures of your heart. For further information please visit [email or web address] Cost of this test is $150 due at time of testing.   Follow-Up: At [facility], you and your health needs are our priority.  As part of our continuing mission to provide you with exceptional heart care, we have created designated Provider Care Teams.  These Care Teams include your primary Cardiologist (physician) and Advanced Practice Providers (APPs -  Physician Assistants and Nurse Practitioners) who all work together to provide you with the care you need, when you need it. You will need a follow up appointment in 1 years.  Please call our office 2 months in advance to schedule this appointment.  You may see Donato Schultz, MD or one of the following Advanced Practice Providers on your designated Care Team:   Norma Fredrickson, NP Nada Boozer, NP . Georgie Chard, NP  Any Other Special Instructions Will Be Listed Below (If Applicable).  Coronary Calcium Scan A coronary calcium scan is an imaging test used to look for deposits of calcium and other fatty materials (plaques)  in the inner lining of the blood vessels of the heart (coronary arteries). These deposits of calcium and plaques can partly clog and narrow the coronary arteries without producing any symptoms or warning signs. This puts a person at risk for a heart attack. This test can detect these deposits before symptoms develop. Tell a health care provider about:  Any allergies you have.  All medicines you are taking, including vitamins, herbs, eye drops, creams, and over-the-counter medicines.  Any problems you or family members have had with anesthetic medicines.  Any blood disorders you have.  Any surgeries you have had.  Any medical conditions you have.  Whether you are pregnant or may be pregnant. What are the risks? Generally, this is a safe procedure. However, problems may occur, including:  Harm to a pregnant woman and her unborn baby. This test involves the use of radiation. Radiation exposure can be dangerous to a pregnant woman and her unborn baby. If you are pregnant, you generally should not have this procedure done.  Slight increase in the risk of cancer. This is because of the radiation involved in the test. What happens before the procedure? No preparation is needed for this procedure. What happens during the procedure?   You will undress and remove any jewelry around your neck or chest.  You will put on a hospital gown.  Sticky electrodes will be placed on your chest. The electrodes will be connected to an electrocardiogram (ECG) machine to record a tracing of the electrical activity of your heart.  A CT scanner will take pictures of your heart. During this time, you will be asked to lie still and hold your breath for 2-3  seconds while a picture of your heart is being taken. The procedure may vary among health care providers and hospitals. What happens after the procedure?  You can get dressed.  You can return to your normal activities.  It is up to you to get the  results of your test. Ask your health care provider, or the department that is doing the test, when your results will be ready. Summary  A coronary calcium scan is an imaging test used to look for deposits of calcium and other fatty materials (plaques) in the inner lining of the blood vessels of the heart (coronary arteries).  Generally, this is a safe procedure. Tell your health care provider if you are pregnant or may be pregnant.  No preparation is needed for this procedure.  A CT scanner will take pictures of your heart.  You can return to your normal activities after the scan is done. This information is not intended to replace advice given to you by your health care provider. Make sure you discuss any questions you have with your health care provider. Document Released: 05/28/2008 Document Revised: 11/12/2017 Document Reviewed: 10/19/2016 Elsevier Patient Education  [place].   Echocardiogram An echocardiogram is a procedure that uses painless sound waves (ultrasound) to produce an image of the heart. Images from an echocardiogram can provide important information about:  Signs of coronary artery disease (CAD).  Aneurysm detection. An aneurysm is a weak or damaged part of an artery wall that bulges out from the normal force of blood pumping through the body.  Heart size and shape. Changes in the size or shape of the heart can be associated with certain conditions, including heart failure, aneurysm, and CAD.  Heart muscle function.  Heart valve function.  Signs of a past heart attack.  Fluid buildup around the heart.  Thickening of the heart muscle.  A tumor or infectious growth around the heart valves. Tell a health care provider about:  Any allergies you have.  All medicines you are taking, including vitamins, herbs, eye drops, creams, and over-the-counter medicines.  Any blood disorders you have.  Any surgeries you have had.  Any medical conditions you  have.  Whether you are pregnant or may be pregnant. What are the risks? Generally, this is a safe procedure. However, problems may occur, including:  Allergic reaction to dye (contrast) that may be used during the procedure. What happens before the procedure? No specific preparation is needed. You may eat and drink normally. What happens during the procedure?   An IV tube may be inserted into one of your veins.  You may receive contrast through this tube. A contrast is an injection that improves the quality of the pictures from your heart.  A gel will be applied to your chest.  A wand-like tool (transducer) will be moved over your chest. The gel will help to transmit the sound waves from the transducer.  The sound waves will harmlessly bounce off of your heart to allow the heart images to be captured in real-time motion. The images will be recorded on a computer. The procedure may vary among health care providers and hospitals. What happens after the procedure?  You may return to your normal, everyday life, including diet, activities, and medicines, unless your health care provider tells you not to do that. Summary  An echocardiogram is a procedure that uses painless sound waves (ultrasound) to produce an image of the heart.  Images from an echocardiogram can provide important information  about the size and shape of your heart, heart muscle function, heart valve function, and fluid buildup around your heart.  You do not need to do anything to prepare before this procedure. You may eat and drink normally.  After the echocardiogram is completed, you may return to your normal, everyday life, unless your health care provider tells you not to do that. This information is not intended to replace advice given to you by your health care provider. Make sure you discuss any questions you have with your health care provider. Document Released: 11/27/2000 Document Revised: 03/23/2019 Document  Reviewed: 01/02/2017 Elsevier Patient Education  2020 [employer]      Signed, Donato SchultzMark Tony Granquist, MD  08/15/2019 9:39 AM    [facility]

## 2019-08-15 NOTE — Patient Instructions (Signed)
Medication Instructions:  Your physician recommends that you continue on your current medications as directed. Please refer to the Current Medication list given to you today.  If you need a refill on your cardiac medications before your next appointment, please call your pharmacy.   Lab work: None If you have labs (blood work) drawn today and your tests are completely normal, you will receive your results only by: Marland Kitchen MyChart Message (if you have MyChart) OR . A paper copy in the mail If you have any lab test that is abnormal or we need to change your treatment, we will call you to review the results.  Testing/Procedures: Your physician has requested that you have an echocardiogram in 1 year, before seeing Dr. Anne Fu. Echocardiography is a painless test that uses sound waves to create images of your heart. It provides your doctor with information about the size and shape of your heart and how well your heart's chambers and valves are working. This procedure takes approximately one hour. There are no restrictions for this procedure.  Your physician has requested that you have cardiac CT Scoring Cardiac computed tomography (CT) is a painless test that uses an x-ray machine to take clear, detailed pictures of your heart. For further information please visit [email or web address] Cost of this test is $150 due at time of testing.   Follow-Up: At [facility], you and your health needs are our priority.  As part of our continuing mission to provide you with exceptional heart care, we have created designated Provider Care Teams.  These Care Teams include your primary Cardiologist (physician) and Advanced Practice Providers (APPs -  Physician Assistants and Nurse Practitioners) who all work together to provide you with the care you need, when you need it. You will need a follow up appointment in 1 years.  Please call our office 2 months in advance to schedule this appointment.  You may see Donato Schultz, MD  or one of the following Advanced Practice Providers on your designated Care Team:   Norma Fredrickson, NP Nada Boozer, NP . Georgie Chard, NP  Any Other Special Instructions Will Be Listed Below (If Applicable).  Coronary Calcium Scan A coronary calcium scan is an imaging test used to look for deposits of calcium and other fatty materials (plaques) in the inner lining of the blood vessels of the heart (coronary arteries). These deposits of calcium and plaques can partly clog and narrow the coronary arteries without producing any symptoms or warning signs. This puts a person at risk for a heart attack. This test can detect these deposits before symptoms develop. Tell a health care provider about:  Any allergies you have.  All medicines you are taking, including vitamins, herbs, eye drops, creams, and over-the-counter medicines.  Any problems you or family members have had with anesthetic medicines.  Any blood disorders you have.  Any surgeries you have had.  Any medical conditions you have.  Whether you are pregnant or may be pregnant. What are the risks? Generally, this is a safe procedure. However, problems may occur, including:  Harm to a pregnant woman and her unborn baby. This test involves the use of radiation. Radiation exposure can be dangerous to a pregnant woman and her unborn baby. If you are pregnant, you generally should not have this procedure done.  Slight increase in the risk of cancer. This is because of the radiation involved in the test. What happens before the procedure? No preparation is needed for this procedure. What happens during the  procedure?   You will undress and remove any jewelry around your neck or chest.  You will put on a hospital gown.  Sticky electrodes will be placed on your chest. The electrodes will be connected to an electrocardiogram (ECG) machine to record a tracing of the electrical activity of your heart.  A CT scanner will take  pictures of your heart. During this time, you will be asked to lie still and hold your breath for 2-3 seconds while a picture of your heart is being taken. The procedure may vary among health care providers and hospitals. What happens after the procedure?  You can get dressed.  You can return to your normal activities.  It is up to you to get the results of your test. Ask your health care provider, or the department that is doing the test, when your results will be ready. Summary  A coronary calcium scan is an imaging test used to look for deposits of calcium and other fatty materials (plaques) in the inner lining of the blood vessels of the heart (coronary arteries).  Generally, this is a safe procedure. Tell your health care provider if you are pregnant or may be pregnant.  No preparation is needed for this procedure.  A CT scanner will take pictures of your heart.  You can return to your normal activities after the scan is done. This information is not intended to replace advice given to you by your health care provider. Make sure you discuss any questions you have with your health care provider. Document Released: 05/28/2008 Document Revised: 11/12/2017 Document Reviewed: 10/19/2016 Elsevier Patient Education  2020 [employer]   Echocardiogram An echocardiogram is a procedure that uses painless sound waves (ultrasound) to produce an image of the heart. Images from an echocardiogram can provide important information about:  Signs of coronary artery disease (CAD).  Aneurysm detection. An aneurysm is a weak or damaged part of an artery wall that bulges out from the normal force of blood pumping through the body.  Heart size and shape. Changes in the size or shape of the heart can be associated with certain conditions, including heart failure, aneurysm, and CAD.  Heart muscle function.  Heart valve function.  Signs of a past heart attack.  Fluid buildup around the heart.   Thickening of the heart muscle.  A tumor or infectious growth around the heart valves. Tell a health care provider about:  Any allergies you have.  All medicines you are taking, including vitamins, herbs, eye drops, creams, and over-the-counter medicines.  Any blood disorders you have.  Any surgeries you have had.  Any medical conditions you have.  Whether you are pregnant or may be pregnant. What are the risks? Generally, this is a safe procedure. However, problems may occur, including:  Allergic reaction to dye (contrast) that may be used during the procedure. What happens before the procedure? No specific preparation is needed. You may eat and drink normally. What happens during the procedure?   An IV tube may be inserted into one of your veins.  You may receive contrast through this tube. A contrast is an injection that improves the quality of the pictures from your heart.  A gel will be applied to your chest.  A wand-like tool (transducer) will be moved over your chest. The gel will help to transmit the sound waves from the transducer.  The sound waves will harmlessly bounce off of your heart to allow the heart images to be captured in  real-time motion. The images will be recorded on a computer. The procedure may vary among health care providers and hospitals. What happens after the procedure?  You may return to your normal, everyday life, including diet, activities, and medicines, unless your health care provider tells you not to do that. Summary  An echocardiogram is a procedure that uses painless sound waves (ultrasound) to produce an image of the heart.  Images from an echocardiogram can provide important information about the size and shape of your heart, heart muscle function, heart valve function, and fluid buildup around your heart.  You do not need to do anything to prepare before this procedure. You may eat and drink normally.  After the echocardiogram is  completed, you may return to your normal, everyday life, unless your health care provider tells you not to do that. This information is not intended to replace advice given to you by your health care provider. Make sure you discuss any questions you have with your health care provider. Document Released: 11/27/2000 Document Revised: 03/23/2019 Document Reviewed: 01/02/2017 Elsevier Patient Education  2020 [employer].

## 2019-08-25 DIAGNOSIS — Z23 Encounter for immunization: Secondary | ICD-10-CM | POA: Diagnosis not present

## 2019-08-30 DIAGNOSIS — G43109 Migraine with aura, not intractable, without status migrainosus: Secondary | ICD-10-CM | POA: Diagnosis not present

## 2019-08-30 DIAGNOSIS — H8103 Meniere's disease, bilateral: Secondary | ICD-10-CM | POA: Diagnosis not present

## 2019-08-30 DIAGNOSIS — H9313 Tinnitus, bilateral: Secondary | ICD-10-CM | POA: Diagnosis not present

## 2019-08-30 DIAGNOSIS — H905 Unspecified sensorineural hearing loss: Secondary | ICD-10-CM | POA: Diagnosis not present

## 2019-09-01 DIAGNOSIS — G43909 Migraine, unspecified, not intractable, without status migrainosus: Secondary | ICD-10-CM | POA: Diagnosis not present

## 2019-09-01 DIAGNOSIS — R42 Dizziness and giddiness: Secondary | ICD-10-CM | POA: Diagnosis not present

## 2019-09-05 DIAGNOSIS — H9313 Tinnitus, bilateral: Secondary | ICD-10-CM | POA: Diagnosis not present

## 2019-09-05 DIAGNOSIS — G43109 Migraine with aura, not intractable, without status migrainosus: Secondary | ICD-10-CM | POA: Diagnosis not present

## 2019-09-05 DIAGNOSIS — H539 Unspecified visual disturbance: Secondary | ICD-10-CM | POA: Diagnosis not present

## 2019-09-05 DIAGNOSIS — G43909 Migraine, unspecified, not intractable, without status migrainosus: Secondary | ICD-10-CM | POA: Diagnosis not present

## 2019-09-05 DIAGNOSIS — R42 Dizziness and giddiness: Secondary | ICD-10-CM | POA: Diagnosis not present

## 2019-09-05 DIAGNOSIS — H8103 Meniere's disease, bilateral: Secondary | ICD-10-CM | POA: Diagnosis not present

## 2019-09-05 DIAGNOSIS — H905 Unspecified sensorineural hearing loss: Secondary | ICD-10-CM | POA: Diagnosis not present

## 2019-09-12 ENCOUNTER — Ambulatory Visit (INDEPENDENT_AMBULATORY_CARE_PROVIDER_SITE_OTHER)
Admission: RE | Admit: 2019-09-12 | Discharge: 2019-09-12 | Disposition: A | Discharge status: Home / Self Care | Payer: Self-pay | Source: Ambulatory Visit | Attending: Cardiology | Admitting: Cardiology

## 2019-09-12 ENCOUNTER — Other Ambulatory Visit: Payer: Self-pay

## 2019-09-12 DIAGNOSIS — I6521 Occlusion and stenosis of right carotid artery: Secondary | ICD-10-CM

## 2019-09-12 DIAGNOSIS — Z8249 Family history of ischemic heart disease and other diseases of the circulatory system: Secondary | ICD-10-CM

## 2019-09-13 DIAGNOSIS — L920 Granuloma annulare: Secondary | ICD-10-CM | POA: Diagnosis not present

## 2019-09-14 ENCOUNTER — Other Ambulatory Visit: Payer: Self-pay | Admitting: Family Medicine

## 2019-09-14 DIAGNOSIS — Z1231 Encounter for screening mammogram for malignant neoplasm of breast: Secondary | ICD-10-CM

## 2019-09-15 ENCOUNTER — Other Ambulatory Visit: Payer: Self-pay

## 2019-09-15 ENCOUNTER — Telehealth: Payer: Self-pay | Admitting: Cardiology

## 2019-09-15 ENCOUNTER — Ambulatory Visit
Admission: RE | Admit: 2019-09-15 | Discharge: 2019-09-15 | Disposition: A | Discharge status: Home / Self Care | Payer: PPO | Source: Ambulatory Visit | Attending: Family Medicine | Admitting: Family Medicine

## 2019-09-15 DIAGNOSIS — Z1231 Encounter for screening mammogram for malignant neoplasm of breast: Secondary | ICD-10-CM

## 2019-09-15 NOTE — Telephone Encounter (Signed)
° °  Patient requesting call to discuss calcium score

## 2019-09-15 NOTE — Telephone Encounter (Signed)
Viewed by Tonye Becket on 09/14/2019 4:03 AM Written by Jerline Pain, MD on 09/13/2019 1:15 PM Mild coronary calcification seen on CT.  Would consider aspirin 81mg  once a day  Also would consider Crestor 5mg  PO QD to minimize risk.  Candee Furbish, MD   Coronary Calcium score of 18.6. This was 62 percentile for age and sex matched control.

## 2019-09-15 NOTE — Telephone Encounter (Signed)
Pt called back asking if Dr Marlou Porch would consider her trying to take Crestor 5 mg maybe only 3 times a week to see if she can tolerate it.  She has had severe muscle pain in the past with statins.  Advised I will have him review and call back with any new orders.   Of note, her PCP monitors her Lipid panel and pt is scheduled to have it drawn again 10/27.

## 2019-09-18 ENCOUNTER — Other Ambulatory Visit: Payer: Self-pay | Admitting: Family Medicine

## 2019-09-18 DIAGNOSIS — N644 Mastodynia: Secondary | ICD-10-CM

## 2019-09-18 MED ORDER — ROSUVASTATIN CALCIUM 5 MG PO TABS: 5.0000 mg | ORAL_TABLET | ORAL | 3 refills | Status: DC

## 2019-09-18 NOTE — Telephone Encounter (Signed)
lpmtcb 10/5 

## 2019-09-18 NOTE — Telephone Encounter (Signed)
I spoke to the patient and informed her of Dr Marlou Porch' recommendation.  Crestor 5 mg 3 times a week.  She verbalized understanding.

## 2019-09-18 NOTE — Telephone Encounter (Signed)
That would be great.  Crestor 5mg  PO three times a week Candee Furbish, MD

## 2019-09-19 DIAGNOSIS — R0789 Other chest pain: Secondary | ICD-10-CM | POA: Diagnosis not present

## 2019-09-19 DIAGNOSIS — K219 Gastro-esophageal reflux disease without esophagitis: Secondary | ICD-10-CM | POA: Diagnosis not present

## 2019-09-19 DIAGNOSIS — E7800 Pure hypercholesterolemia, unspecified: Secondary | ICD-10-CM | POA: Diagnosis not present

## 2019-09-19 DIAGNOSIS — N644 Mastodynia: Secondary | ICD-10-CM | POA: Diagnosis not present

## 2019-09-19 DIAGNOSIS — G43909 Migraine, unspecified, not intractable, without status migrainosus: Secondary | ICD-10-CM | POA: Diagnosis not present

## 2019-09-19 DIAGNOSIS — H8103 Meniere's disease, bilateral: Secondary | ICD-10-CM | POA: Diagnosis not present

## 2019-09-19 DIAGNOSIS — M810 Age-related osteoporosis without current pathological fracture: Secondary | ICD-10-CM | POA: Diagnosis not present

## 2019-09-21 ENCOUNTER — Other Ambulatory Visit: Payer: Self-pay

## 2019-09-21 ENCOUNTER — Ambulatory Visit: Payer: PPO

## 2019-09-21 ENCOUNTER — Ambulatory Visit
Admission: RE | Admit: 2019-09-21 | Discharge: 2019-09-21 | Disposition: A | Discharge status: Home / Self Care | Payer: PPO | Source: Ambulatory Visit | Attending: Family Medicine | Admitting: Family Medicine

## 2019-09-21 DIAGNOSIS — R922 Inconclusive mammogram: Secondary | ICD-10-CM | POA: Diagnosis not present

## 2019-09-21 DIAGNOSIS — N644 Mastodynia: Secondary | ICD-10-CM

## 2019-09-27 DIAGNOSIS — L218 Other seborrheic dermatitis: Secondary | ICD-10-CM | POA: Diagnosis not present

## 2019-10-10 ENCOUNTER — Encounter: Payer: Self-pay | Admitting: Cardiology

## 2019-10-10 DIAGNOSIS — G43909 Migraine, unspecified, not intractable, without status migrainosus: Secondary | ICD-10-CM | POA: Diagnosis not present

## 2019-10-10 DIAGNOSIS — E538 Deficiency of other specified B group vitamins: Secondary | ICD-10-CM | POA: Diagnosis not present

## 2019-10-10 DIAGNOSIS — E7800 Pure hypercholesterolemia, unspecified: Secondary | ICD-10-CM | POA: Diagnosis not present

## 2019-10-10 DIAGNOSIS — E559 Vitamin D deficiency, unspecified: Secondary | ICD-10-CM | POA: Diagnosis not present

## 2020-01-02 DIAGNOSIS — M810 Age-related osteoporosis without current pathological fracture: Secondary | ICD-10-CM | POA: Diagnosis not present

## 2020-01-02 DIAGNOSIS — E7800 Pure hypercholesterolemia, unspecified: Secondary | ICD-10-CM | POA: Diagnosis not present

## 2020-01-04 DIAGNOSIS — R351 Nocturia: Secondary | ICD-10-CM | POA: Diagnosis not present

## 2020-01-04 DIAGNOSIS — N3020 Other chronic cystitis without hematuria: Secondary | ICD-10-CM | POA: Diagnosis not present

## 2020-01-15 DIAGNOSIS — M810 Age-related osteoporosis without current pathological fracture: Secondary | ICD-10-CM | POA: Diagnosis not present

## 2020-01-15 DIAGNOSIS — E7800 Pure hypercholesterolemia, unspecified: Secondary | ICD-10-CM | POA: Diagnosis not present

## 2020-01-26 ENCOUNTER — Ambulatory Visit: Payer: PPO | Attending: Internal Medicine

## 2020-01-26 DIAGNOSIS — Z23 Encounter for immunization: Secondary | ICD-10-CM

## 2020-01-26 NOTE — Progress Notes (Signed)
   Covid-19 Vaccination Clinic  Name:  Lori Frost    MRN: [id number] DOB: 05-21-1946  01/26/2020  [name] was observed post Covid-19 immunization for 30 minutes based on pre-vaccination screening without incidence. She was provided with Vaccine Information Sheet and instruction to access the V-Safe system.   [name] was instructed to call 911 with any severe reactions post vaccine: Marland Kitchen Difficulty breathing  . Swelling of your face and throat  . A fast heartbeat  . A bad rash all over your body  . Dizziness and weakness    Immunizations Administered    Name Date Dose VIS Date Route   Pfizer COVID-19 Vaccine 01/26/2020  4:58 PM 0.3 mL 11/24/2019 Intramuscular   Manufacturer: [employer], [employer]   Lot: [id number]   NDC: [address]-4

## 2020-02-18 ENCOUNTER — Ambulatory Visit: Payer: PPO | Attending: Internal Medicine

## 2020-02-18 DIAGNOSIS — Z23 Encounter for immunization: Secondary | ICD-10-CM | POA: Insufficient documentation

## 2020-02-18 NOTE — Progress Notes (Signed)
   Covid-19 Vaccination Clinic  Name:  MELIAH APPLEMAN    MRN: [id number] DOB: Feb 21, 1946  02/18/2020  Ms. Winward was observed post Covid-19 immunization for 15 minutes without incident. She was provided with Vaccine Information Sheet and instruction to access the V-Safe system.   Ms. Qazi was instructed to call 911 with any severe reactions post vaccine: Marland Kitchen Difficulty breathing  . Swelling of face and throat  . A fast heartbeat  . A bad rash all over body  . Dizziness and weakness   Immunizations Administered    Name Date Dose VIS Date Route   Pfizer COVID-19 Vaccine 02/18/2020  8:25 AM 0.3 mL 11/24/2019 Intramuscular   Manufacturer: [employer], [employer]   Lot: [id number]   NDC: [address]-7

## 2020-02-23 DIAGNOSIS — R350 Frequency of micturition: Secondary | ICD-10-CM | POA: Diagnosis not present

## 2020-02-23 DIAGNOSIS — N3020 Other chronic cystitis without hematuria: Secondary | ICD-10-CM | POA: Diagnosis not present

## 2020-03-12 DIAGNOSIS — E7800 Pure hypercholesterolemia, unspecified: Secondary | ICD-10-CM | POA: Diagnosis not present

## 2020-03-12 DIAGNOSIS — M810 Age-related osteoporosis without current pathological fracture: Secondary | ICD-10-CM | POA: Diagnosis not present

## 2020-03-28 DIAGNOSIS — H608X3 Other otitis externa, bilateral: Secondary | ICD-10-CM | POA: Diagnosis not present

## 2020-03-28 DIAGNOSIS — H8103 Meniere's disease, bilateral: Secondary | ICD-10-CM | POA: Diagnosis not present

## 2020-03-28 DIAGNOSIS — H9313 Tinnitus, bilateral: Secondary | ICD-10-CM | POA: Diagnosis not present

## 2020-03-28 DIAGNOSIS — H903 Sensorineural hearing loss, bilateral: Secondary | ICD-10-CM | POA: Diagnosis not present

## 2020-03-28 DIAGNOSIS — G43109 Migraine with aura, not intractable, without status migrainosus: Secondary | ICD-10-CM | POA: Diagnosis not present

## 2020-04-03 DIAGNOSIS — E7800 Pure hypercholesterolemia, unspecified: Secondary | ICD-10-CM | POA: Diagnosis not present

## 2020-04-03 DIAGNOSIS — E559 Vitamin D deficiency, unspecified: Secondary | ICD-10-CM | POA: Diagnosis not present

## 2020-04-17 DIAGNOSIS — R350 Frequency of micturition: Secondary | ICD-10-CM | POA: Diagnosis not present

## 2020-04-17 DIAGNOSIS — N3020 Other chronic cystitis without hematuria: Secondary | ICD-10-CM | POA: Diagnosis not present

## 2020-04-23 DIAGNOSIS — N3281 Overactive bladder: Secondary | ICD-10-CM | POA: Diagnosis not present

## 2020-04-23 DIAGNOSIS — R300 Dysuria: Secondary | ICD-10-CM | POA: Diagnosis not present

## 2020-04-23 DIAGNOSIS — F418 Other specified anxiety disorders: Secondary | ICD-10-CM | POA: Diagnosis not present

## 2020-05-02 DIAGNOSIS — E7800 Pure hypercholesterolemia, unspecified: Secondary | ICD-10-CM | POA: Diagnosis not present

## 2020-05-02 DIAGNOSIS — M810 Age-related osteoporosis without current pathological fracture: Secondary | ICD-10-CM | POA: Diagnosis not present

## 2020-06-05 DIAGNOSIS — Z1159 Encounter for screening for other viral diseases: Secondary | ICD-10-CM | POA: Diagnosis not present

## 2020-06-10 DIAGNOSIS — Z1211 Encounter for screening for malignant neoplasm of colon: Secondary | ICD-10-CM | POA: Diagnosis not present

## 2020-06-19 DIAGNOSIS — Z9889 Other specified postprocedural states: Secondary | ICD-10-CM | POA: Diagnosis not present

## 2020-06-19 DIAGNOSIS — H524 Presbyopia: Secondary | ICD-10-CM | POA: Diagnosis not present

## 2020-06-19 DIAGNOSIS — Z961 Presence of intraocular lens: Secondary | ICD-10-CM | POA: Diagnosis not present

## 2020-07-01 DIAGNOSIS — M810 Age-related osteoporosis without current pathological fracture: Secondary | ICD-10-CM | POA: Diagnosis not present

## 2020-07-01 DIAGNOSIS — E7800 Pure hypercholesterolemia, unspecified: Secondary | ICD-10-CM | POA: Diagnosis not present

## 2020-07-09 DIAGNOSIS — E7800 Pure hypercholesterolemia, unspecified: Secondary | ICD-10-CM | POA: Diagnosis not present

## 2020-07-09 DIAGNOSIS — I779 Disorder of arteries and arterioles, unspecified: Secondary | ICD-10-CM | POA: Diagnosis not present

## 2020-07-09 DIAGNOSIS — I341 Nonrheumatic mitral (valve) prolapse: Secondary | ICD-10-CM | POA: Diagnosis not present

## 2020-07-09 DIAGNOSIS — E559 Vitamin D deficiency, unspecified: Secondary | ICD-10-CM | POA: Diagnosis not present

## 2020-07-09 DIAGNOSIS — M810 Age-related osteoporosis without current pathological fracture: Secondary | ICD-10-CM | POA: Diagnosis not present

## 2020-07-09 DIAGNOSIS — Z0000 Encounter for general adult medical examination without abnormal findings: Secondary | ICD-10-CM | POA: Diagnosis not present

## 2020-07-09 DIAGNOSIS — I511 Rupture of chordae tendineae, not elsewhere classified: Secondary | ICD-10-CM | POA: Diagnosis not present

## 2020-07-11 ENCOUNTER — Other Ambulatory Visit: Payer: Self-pay | Admitting: Family Medicine

## 2020-07-11 DIAGNOSIS — Z1231 Encounter for screening mammogram for malignant neoplasm of breast: Secondary | ICD-10-CM

## 2020-07-11 DIAGNOSIS — M810 Age-related osteoporosis without current pathological fracture: Secondary | ICD-10-CM

## 2020-07-29 ENCOUNTER — Other Ambulatory Visit: Payer: Self-pay

## 2020-07-29 ENCOUNTER — Ambulatory Visit (HOSPITAL_COMMUNITY): Payer: PPO | Attending: Internal Medicine

## 2020-07-29 DIAGNOSIS — I340 Nonrheumatic mitral (valve) insufficiency: Secondary | ICD-10-CM

## 2020-07-29 DIAGNOSIS — I6521 Occlusion and stenosis of right carotid artery: Secondary | ICD-10-CM

## 2020-07-29 DIAGNOSIS — I341 Nonrheumatic mitral (valve) prolapse: Secondary | ICD-10-CM

## 2020-07-29 LAB — ECHOCARDIOGRAM COMPLETE
Area-P 1/2: 3.03 cm2
P 1/2 time: 420 msec
S' Lateral: 2.8 cm

## 2020-07-30 ENCOUNTER — Telehealth: Payer: Self-pay | Admitting: *Deleted

## 2020-07-30 DIAGNOSIS — I340 Nonrheumatic mitral (valve) insufficiency: Secondary | ICD-10-CM

## 2020-07-30 DIAGNOSIS — I341 Nonrheumatic mitral (valve) prolapse: Secondary | ICD-10-CM

## 2020-07-30 NOTE — Telephone Encounter (Signed)
-----   Message from Jake Bathe, MD sent at 07/29/2020  5:48 PM EDT ----- Normal pump function.  Continued moderate to severe mitral valve regurgitation, no change from prior echocardiogram. Please repeat echocardiogram in 1 year. Donato Schultz, MD

## 2020-07-30 NOTE — Telephone Encounter (Signed)
Pt has been notified of echo results by phone with verbal understanding. Confirmed 08/14/20 appt with Dr. Anne Fu. Pt agreeable to plan of care to repeat echo in 1 yr. Patient notified of result.  Echo order has been placed, though no appt made for echo made at this time. Please refer to phone note from today for complete details.   Danielle Rankin, [facility] 07/30/2020 1:49 PM

## 2020-08-14 ENCOUNTER — Encounter: Payer: Self-pay | Admitting: Cardiology

## 2020-08-14 ENCOUNTER — Other Ambulatory Visit: Payer: Self-pay

## 2020-08-14 ENCOUNTER — Ambulatory Visit: Payer: PPO | Admitting: Cardiology

## 2020-08-14 VITALS — BP 120/80 | HR 71 | Ht 61.0 in | Wt 109.8 lb

## 2020-08-14 DIAGNOSIS — I6521 Occlusion and stenosis of right carotid artery: Secondary | ICD-10-CM

## 2020-08-14 DIAGNOSIS — I341 Nonrheumatic mitral (valve) prolapse: Secondary | ICD-10-CM | POA: Diagnosis not present

## 2020-08-14 DIAGNOSIS — I340 Nonrheumatic mitral (valve) insufficiency: Secondary | ICD-10-CM | POA: Diagnosis not present

## 2020-08-14 DIAGNOSIS — Z8249 Family history of ischemic heart disease and other diseases of the circulatory system: Secondary | ICD-10-CM

## 2020-08-14 NOTE — Progress Notes (Signed)
Cardiology Office Note:    Date:  08/14/2020   ID:  LILEY RAKE, DOB [birth year], MRN [id number]  PCP:  Juluis Rainier, MD  [facility] Cardiologist:  Donato Schultz, MD  [facility] Electrophysiologist:  None   Referring MD: Juluis Rainier, MD     History of Present Illness:    Lori Frost is a 74 y.o. female here for follow up of CAD.   Dr. Donnie Aho  MVP  Statin intolerant, has trouble taking higher doses of Crestor secondary to thumb pain/difficulty with grip. Myalgias.   She was allergic to codeine-nausea Crestor/muscle aches Lipitor/muscle aches Topamax/fatigue tramadol rash Zoloft intolerance   Echocardiogram in 2019 demonstrated moderate eccentric mitral regurgitation with a previously partial ruptured chorda.  Chamber dimensions were normal.  LVEF 60%.  She is also had a history of vertigo migraines Mnire's disease.  Prior LDL 144.    Currently 89 with Crestor 3 days a week and Zetia.  Past Medical History:  Diagnosis Date  . Allergy    Non-Allergic Rhinitis  . Balance problem due to vestibular dysfunction   . Bloating   . CAD (coronary artery disease)   . Carotid artery occlusion   . Cataract   . Chordae tendineae rupture (HCC)   . Cyst of ovary, right   . Depression   . Diverticulosis   . DJD (degenerative joint disease)   . Eczema   . Family history of adverse reaction to anesthesia    SISTER "HEART STOPPED DURING QJJHERD"4081 UNKNOWN CAUSE -  SHE DIED 10 YRS LATER OF HEART ATTACK  . GERD (gastroesophageal reflux disease)    ESOPHAGITIS PRESENCE NOT SPECIFIED  . Granuloma annulare   . History of hiatal hernia   . History of recurrent UTI (urinary tract infection)   . Hypercholesterolemia   . Hyperlipidemia   . Lower back pain   . Meniere's disease   . Migraine   . Mitral valve regurgitation    MODERATE NORMAL, MILD AORTIC REGURGITATION, MILD TRICUSPID REGURGITATION, LVH, GRADE 1 DIASTOLIC DYSFUNCTION, ECHO 2018 SUGGESTS RUPTURED  CHORDAE TENDINEAE ( DR. Donnie Aho)  . MVP (mitral valve prolapse)   . Myalgia   . OA (osteoarthritis) of hip 02/12/2016  . Osteoporosis   . Other nonrheumatic mitral valve disorders   . Psoriasis   . Pupil dilated    DUE TO EYE DROPS FOR TX OF RT  EYE PROBLEM  . Reactive hypoglycemia   . Sensitive skin   . Shingles   . Vertigo   . Vestibular neuronitis   . Vitamin D deficiency     Past Surgical History:  Procedure Laterality Date  . EYE SURGERY Right 02-05-15   Cataract  . EYE SURGERY Left 02-21-15   Cataract  . TONSILLECTOMY    . TOTAL HIP ARTHROPLASTY Right 02/12/2016   Procedure: RIGHT TOTAL HIP ARTHROPLASTY ANTERIOR APPROACH;  Surgeon: Ollen Gross, MD;  Location: [facility];  Service: Orthopedics;  Laterality: Right;    Current Medications: Current Meds  Medication Sig  . acetaminophen (TYLENOL) 325 MG tablet Take 2 tablets (650 mg total) by mouth every 6 (six) hours as needed for mild pain (or Fever >/= 101).  Marland Kitchen alendronate (FOSAMAX) 70 MG tablet 1 TABLET ONCE A WEEK SATURDAYS ORALLY 90 DAYS  . alendronate (FOSAMAX) 70 MG tablet Take by mouth.  Marland Kitchen aspirin 81 MG chewable tablet Chew 81 mg by mouth daily. Give 1 tablet by mouth daily once XARELTO has completed 2 week dose  . calcium carbonate (CALCIUM  600) 600 MG TABS tablet Take by mouth.  . cetirizine (ZYRTEC) 10 MG tablet Take by mouth.  . Cholecalciferol (VITAMIN D3) 1000 units CAPS Take by mouth.  . diazepam (VALIUM) 2 MG tablet Take 2 mg by mouth every 8 (eight) hours as needed. Vertigo.  . diphenhydrAMINE (BENADRYL) 12.5 MG/5ML elixir Take 5-10 mLs (12.5-25 mg total) by mouth every 4 (four) hours as needed for itching.  . Ezetimibe (ZETIA PO) Take once a week by mouth.  . famotidine (PEPCID) 10 MG tablet Take 10 mg by mouth 2 (two) times daily.  . rosuvastatin (CRESTOR) 5 MG tablet Take 1 tablet (5 mg total) by mouth 3 (three) times a week.  . triamterene-hydrochlorothiazide (MAXZIDE-25) 37.5-25 MG tablet Take 1 tablet by  mouth daily.  Marland Kitchen. trimethoprim (TRIMPEX) 100 MG tablet Take 100 mg by mouth daily.     Allergies:   Acetazolamide, Codeine, Sertraline hcl, Statins, Tape, Topamax [topiramate], and Tramadol   Social History   Socioeconomic History  . Marital status: Single    Spouse name: Not on file  . Number of children: Not on file  . Years of education: Not on file  . Highest education level: Not on file  Occupational History  . Not on file  Tobacco Use  . Smoking status: Never Smoker  . Smokeless tobacco: Never Used  Vaping Use  . Vaping Use: Never used  Substance and Sexual Activity  . Alcohol use: No    Alcohol/week: 0.0 standard drinks  . Drug use: No  . Sexual activity: Not on file  Other Topics Concern  . Not on file  Social History Narrative  . Not on file   Social Determinants of Health   Financial Resource Strain:   . Difficulty of Paying Living Expenses: Not on file  Food Insecurity:   . Worried About Programme researcher, broadcasting/film/videounning Out of Food in the Last Year: Not on file  . Ran Out of Food in the Last Year: Not on file  Transportation Needs:   . Lack of Transportation (Medical): Not on file  . Lack of Transportation (Non-Medical): Not on file  Physical Activity:   . Days of Exercise per Week: Not on file  . Minutes of Exercise per Session: Not on file  Stress:   . Feeling of Stress : Not on file  Social Connections:   . Frequency of Communication with Friends and Family: Not on file  . Frequency of Social Gatherings with Friends and Family: Not on file  . Attends Religious Services: Not on file  . Active Member of Clubs or Organizations: Not on file  . Attends BankerClub or Organization Meetings: Not on file  . Marital Status: Not on file     Family History: The patient's family history includes Cancer in her sister; Diabetes in her father and mother; Heart attack in her mother and sister.  ROS:   Please see the history of present illness.     All other systems reviewed and are  negative.  EKGs/Labs/Other Studies Reviewed:    The following studies were reviewed today: ECHO 07/29/20  1. Left ventricular ejection fraction, by estimation, is 60 to 65%. The  left ventricle has normal function. The left ventricle has no regional  wall motion abnormalities. There is mild left ventricular hypertrophy.  Left ventricular diastolic parameters  are consistent with Grade I diastolic dysfunction (impaired relaxation).  2. Right ventricular systolic function is normal. The right ventricular  size is normal. There is normal pulmonary artery systolic  pressure.  3. The mitral valve is abnormal. Moderate to severe mitral valve  regurgitation.  4. The aortic valve is tricuspid. Aortic valve regurgitation is mild.  Mild aortic valve sclerosis is present, with no evidence of aortic valve  stenosis.  5. Aortic dilatation noted. There is mild dilatation of the ascending  aorta measuring 38 mm.  6. The inferior vena cava is normal in size with greater than 50%  respiratory variability, suggesting right atrial pressure of 3 mmHg.   EKG:  EKG is  ordered today.  The ekg ordered today demonstrates sinus rhythm 71 no other abnormalities  Recent Labs: No results found for requested labs within last 8760 hours.  Recent Lipid Panel No results found for: CHOL, TRIG, HDL, CHOLHDL, VLDL, LDLCALC, LDLDIRECT  Physical Exam:    VS:  BP 120/80   Pulse 71   Ht 5\' 1"  (1.549 m)   Wt 109 lb 12.8 oz (49.8 kg)   SpO2 96%   BMI 20.75 kg/m     Wt Readings from Last 3 Encounters:  08/14/20 109 lb 12.8 oz (49.8 kg)  08/15/19 112 lb 12.8 oz (51.2 kg)  11/01/17 123 lb (55.8 kg)     GEN:  Well nourished, well developed in no acute distress HEENT: Normal NECK: No JVD; carotid bruits noted, may be radiation of murmur LYMPHATICS: No lymphadenopathy CARDIAC: RRR, 3/6 systolic murmur, no rubs, gallops RESPIRATORY:  Clear to auscultation without rales, wheezing or rhonchi  ABDOMEN: Soft,  non-tender, non-distended MUSCULOSKELETAL:  Left LLE edema; No deformity  SKIN: Warm and dry NEUROLOGIC:  Alert and oriented x 3 PSYCHIATRIC:  Normal affect   ASSESSMENT:    1. Moderate mitral regurgitation   2. Family history of cardiovascular disease   3. Stenosis of right carotid artery   4. Mitral prolapse    PLAN:    In order of problems listed above:  Moderate to severe mitral valve regurgitation -Currently not having any significant symptoms. EF is remaining normal. No dilatation. We will continue to monitor. At some point, need to consider repair.  Coronary calcification -Coronary calcium score of 18.6. This was 41 percentile for age and sex matched control.  Continue with statin, Zetia.  Aortic atherosclerosis -Continue with aggressive secondary risk factor modification. She has had statin intolerance  Hyperlipidemia -Currently on low-dose Crestor 5 mg 3 days a week and Zetia.  Recent lipid panel, Dr. 49 LDL 97.  Ultimately would love her to be 71 but unable to tolerate higher doses.  Carotid artery disease -Plaque noted. Aggressive risk factor modification. 2018 mild. Repeat  Medication Adjustments/Labs and Tests Ordered: Current medicines are reviewed at length with the patient today.  Concerns regarding medicines are outlined above.  Orders Placed This Encounter  Procedures  . EKG 12-Lead  . ECHOCARDIOGRAM COMPLETE  . VAS 2019 CAROTID   No orders of the defined types were placed in this encounter.   Patient Instructions  Medication Instructions:  The current medical regimen is effective;  continue present plan and medications.  *If you need a refill on your cardiac medications before your next appointment, please call your pharmacy*  Testing/Procedures: Your physician has requested that you have a carotid duplex. This test is an ultrasound of the carotid arteries in your neck. It looks at blood flow through these arteries that supply the brain with  blood. Allow one hour for this exam. There are no restrictions or special instructions.  Your physician has requested that you have an echocardiogram in 1  year. Echocardiography is a painless test that uses sound waves to create images of your heart. It provides your doctor with information about the size and shape of your heart and how well your heart's chambers and valves are working. This procedure takes approximately one hour. There are no restrictions for this procedure.  Follow-Up: At [facility], you and your health needs are our priority.  As part of our continuing mission to provide you with exceptional heart care, we have created designated Provider Care Teams.  These Care Teams include your primary Cardiologist (physician) and Advanced Practice Providers (APPs -  Physician Assistants and Nurse Practitioners) who all work together to provide you with the care you need, when you need it.  We recommend signing up for the patient portal called "MyChart".  Sign up information is provided on this After Visit Summary.  MyChart is used to connect with patients for Virtual Visits (Telemedicine).  Patients are able to view lab/test results, encounter notes, upcoming appointments, etc.  Non-urgent messages can be sent to your provider as well.   To learn more about what you can do with MyChart, go to [email or web address].    Your next appointment:   12 month(s)  The format for your next appointment:   In Person  Provider:   Donato Schultz, MD   Thank you for choosing [facility]!!        Signed, Donato Schultz, MD  08/14/2020 10:15 AM    [facility]

## 2020-08-14 NOTE — Patient Instructions (Signed)
Medication Instructions:  The current medical regimen is effective;  continue present plan and medications.  *If you need a refill on your cardiac medications before your next appointment, please call your pharmacy*  Testing/Procedures: Your physician has requested that you have a carotid duplex. This test is an ultrasound of the carotid arteries in your neck. It looks at blood flow through these arteries that supply the brain with blood. Allow one hour for this exam. There are no restrictions or special instructions.  Your physician has requested that you have an echocardiogram in 1 year. Echocardiography is a painless test that uses sound waves to create images of your heart. It provides your doctor with information about the size and shape of your heart and how well your heart's chambers and valves are working. This procedure takes approximately one hour. There are no restrictions for this procedure.  Follow-Up: At [facility], you and your health needs are our priority.  As part of our continuing mission to provide you with exceptional heart care, we have created designated Provider Care Teams.  These Care Teams include your primary Cardiologist (physician) and Advanced Practice Providers (APPs -  Physician Assistants and Nurse Practitioners) who all work together to provide you with the care you need, when you need it.  We recommend signing up for the patient portal called "MyChart".  Sign up information is provided on this After Visit Summary.  MyChart is used to connect with patients for Virtual Visits (Telemedicine).  Patients are able to view lab/test results, encounter notes, upcoming appointments, etc.  Non-urgent messages can be sent to your provider as well.   To learn more about what you can do with MyChart, go to [email or web address].    Your next appointment:   12 month(s)  The format for your next appointment:   In Person  Provider:   Donato Schultz, MD   Thank you for  choosing [facility]!!

## 2020-08-15 DIAGNOSIS — M2041 Other hammer toe(s) (acquired), right foot: Secondary | ICD-10-CM | POA: Diagnosis not present

## 2020-08-15 DIAGNOSIS — L600 Ingrowing nail: Secondary | ICD-10-CM | POA: Diagnosis not present

## 2020-08-15 DIAGNOSIS — M79672 Pain in left foot: Secondary | ICD-10-CM | POA: Diagnosis not present

## 2020-08-15 DIAGNOSIS — M79671 Pain in right foot: Secondary | ICD-10-CM | POA: Diagnosis not present

## 2020-08-16 DIAGNOSIS — M810 Age-related osteoporosis without current pathological fracture: Secondary | ICD-10-CM | POA: Diagnosis not present

## 2020-08-16 DIAGNOSIS — E7800 Pure hypercholesterolemia, unspecified: Secondary | ICD-10-CM | POA: Diagnosis not present

## 2020-08-16 DIAGNOSIS — K219 Gastro-esophageal reflux disease without esophagitis: Secondary | ICD-10-CM | POA: Diagnosis not present

## 2020-08-29 DIAGNOSIS — M79675 Pain in left toe(s): Secondary | ICD-10-CM | POA: Diagnosis not present

## 2020-08-29 DIAGNOSIS — L600 Ingrowing nail: Secondary | ICD-10-CM | POA: Diagnosis not present

## 2020-08-29 DIAGNOSIS — M79674 Pain in right toe(s): Secondary | ICD-10-CM | POA: Diagnosis not present

## 2020-08-29 DIAGNOSIS — B351 Tinea unguium: Secondary | ICD-10-CM | POA: Diagnosis not present

## 2020-09-04 ENCOUNTER — Ambulatory Visit (HOSPITAL_COMMUNITY)
Admission: RE | Admit: 2020-09-04 | Discharge: 2020-09-04 | Disposition: A | Discharge status: Home / Self Care | Payer: PPO | Source: Ambulatory Visit | Attending: Cardiovascular Disease | Admitting: Cardiovascular Disease

## 2020-09-04 ENCOUNTER — Other Ambulatory Visit: Payer: Self-pay

## 2020-09-04 DIAGNOSIS — I6521 Occlusion and stenosis of right carotid artery: Secondary | ICD-10-CM | POA: Diagnosis not present

## 2020-09-05 ENCOUNTER — Telehealth: Payer: Self-pay | Admitting: Cardiology

## 2020-09-05 NOTE — Telephone Encounter (Signed)
New message:     Patient returning a call back to the nurse concering some results. Please call patient back.

## 2020-09-05 NOTE — Telephone Encounter (Signed)
Mild bilateral carotid plaque.  No change     Pt aware of carotid results ./cy

## 2020-09-06 ENCOUNTER — Telehealth: Payer: Self-pay

## 2020-09-06 NOTE — Telephone Encounter (Signed)
Pt advised her Carotid ultrasound results and she verbalized understanding.

## 2020-09-19 DIAGNOSIS — N3941 Urge incontinence: Secondary | ICD-10-CM | POA: Diagnosis not present

## 2020-09-19 DIAGNOSIS — N3020 Other chronic cystitis without hematuria: Secondary | ICD-10-CM | POA: Diagnosis not present

## 2020-09-26 DIAGNOSIS — I511 Rupture of chordae tendineae, not elsewhere classified: Secondary | ICD-10-CM | POA: Diagnosis not present

## 2020-09-26 DIAGNOSIS — M810 Age-related osteoporosis without current pathological fracture: Secondary | ICD-10-CM | POA: Diagnosis not present

## 2020-09-26 DIAGNOSIS — I341 Nonrheumatic mitral (valve) prolapse: Secondary | ICD-10-CM | POA: Diagnosis not present

## 2020-09-26 DIAGNOSIS — E7800 Pure hypercholesterolemia, unspecified: Secondary | ICD-10-CM | POA: Diagnosis not present

## 2020-09-26 DIAGNOSIS — I779 Disorder of arteries and arterioles, unspecified: Secondary | ICD-10-CM | POA: Diagnosis not present

## 2020-09-26 DIAGNOSIS — E559 Vitamin D deficiency, unspecified: Secondary | ICD-10-CM | POA: Diagnosis not present

## 2020-09-26 DIAGNOSIS — Z0000 Encounter for general adult medical examination without abnormal findings: Secondary | ICD-10-CM | POA: Diagnosis not present

## 2020-10-16 ENCOUNTER — Other Ambulatory Visit: Payer: Self-pay

## 2020-10-16 ENCOUNTER — Ambulatory Visit
Admission: RE | Admit: 2020-10-16 | Discharge: 2020-10-16 | Disposition: A | Discharge status: Home / Self Care | Payer: PPO | Source: Ambulatory Visit | Attending: Family Medicine | Admitting: Family Medicine

## 2020-10-16 ENCOUNTER — Ambulatory Visit: Payer: PPO

## 2020-10-16 DIAGNOSIS — Z780 Asymptomatic menopausal state: Secondary | ICD-10-CM | POA: Diagnosis not present

## 2020-10-16 DIAGNOSIS — M8589 Other specified disorders of bone density and structure, multiple sites: Secondary | ICD-10-CM | POA: Diagnosis not present

## 2020-10-16 DIAGNOSIS — M810 Age-related osteoporosis without current pathological fracture: Secondary | ICD-10-CM

## 2020-10-19 ENCOUNTER — Other Ambulatory Visit: Payer: Self-pay | Admitting: Cardiology

## 2020-10-22 DIAGNOSIS — K219 Gastro-esophageal reflux disease without esophagitis: Secondary | ICD-10-CM | POA: Diagnosis not present

## 2020-10-22 DIAGNOSIS — E7800 Pure hypercholesterolemia, unspecified: Secondary | ICD-10-CM | POA: Diagnosis not present

## 2020-10-22 DIAGNOSIS — M810 Age-related osteoporosis without current pathological fracture: Secondary | ICD-10-CM | POA: Diagnosis not present

## 2020-10-24 DIAGNOSIS — M79675 Pain in left toe(s): Secondary | ICD-10-CM | POA: Diagnosis not present

## 2020-10-24 DIAGNOSIS — M79674 Pain in right toe(s): Secondary | ICD-10-CM | POA: Diagnosis not present

## 2020-10-24 DIAGNOSIS — L600 Ingrowing nail: Secondary | ICD-10-CM | POA: Diagnosis not present

## 2020-10-24 DIAGNOSIS — B351 Tinea unguium: Secondary | ICD-10-CM | POA: Diagnosis not present

## 2020-11-06 IMAGING — CT CT HEART SCORING
2 series · 16 of 20 positions shown, 18 images · non-contrast
Comparison: None.
COMPARISON: None.

Addendum:
EXAM:
OVER-READ INTERPRETATION  CT CHEST

The following report is an over-read performed by radiologist Dr.
Arushi Zavaleta [REDACTED] on 09/12/2019. This
over-read does not include interpretation of cardiac or coronary
anatomy or pathology. The coronary calcium score interpretation by
the cardiologist is attached.
CLINICAL DATA: 73 year old with mitral annular calcification here
for further risk stratification
Coronary Calcium Score
TECHNIQUE: The patient was scanned on a Siemens Force scanner. Axial
non-contrast 3 mm slices were carried out through the heart. The
data set was analyzed on a dedicated work station and scored using
the Agatson method.

[Series 2: casc 3.0 i36f 2 bestdiast 69 % · axial · 0.31mm/px · z∈[-244,-148]mm · 8 of 42 slices shown, 10 images]
[im 5/42  vessel]
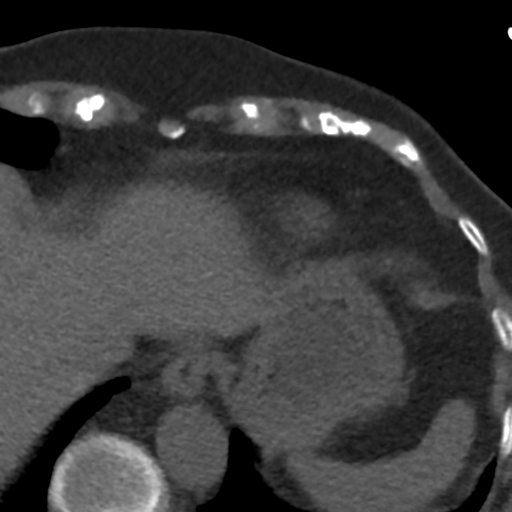
[im 5/42  lung]
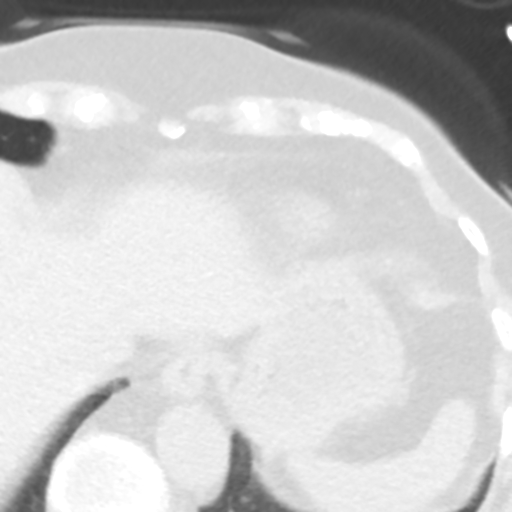
[im 10/42  vessel]
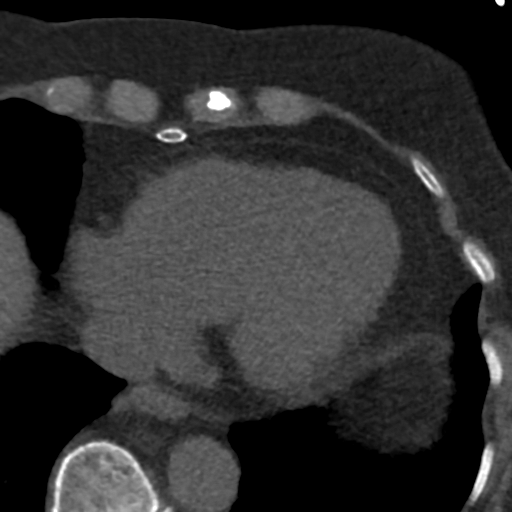
[im 14/42  vessel]
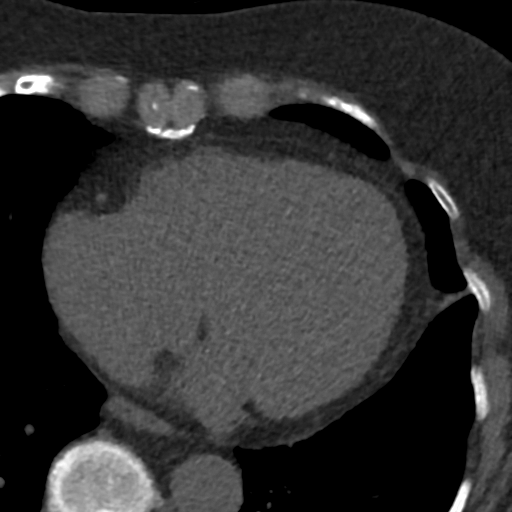
[im 19/42  vessel]
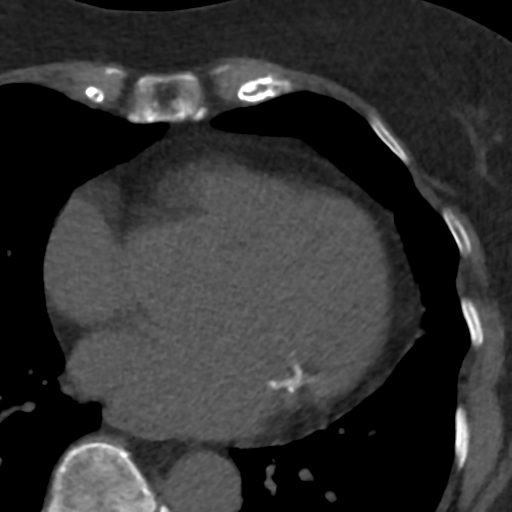
[im 23/42  vessel]
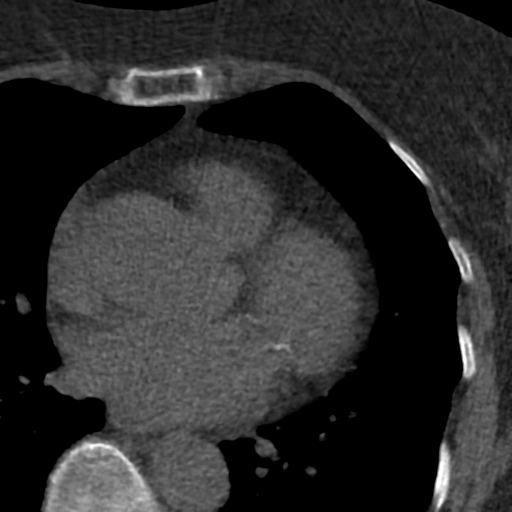
[im 23/42  lung]
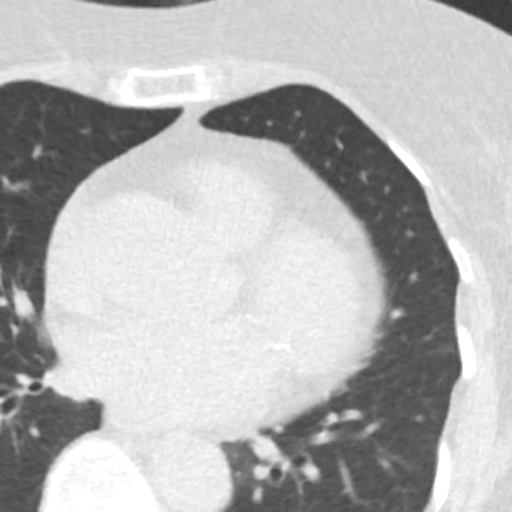
[im 28/42  vessel]
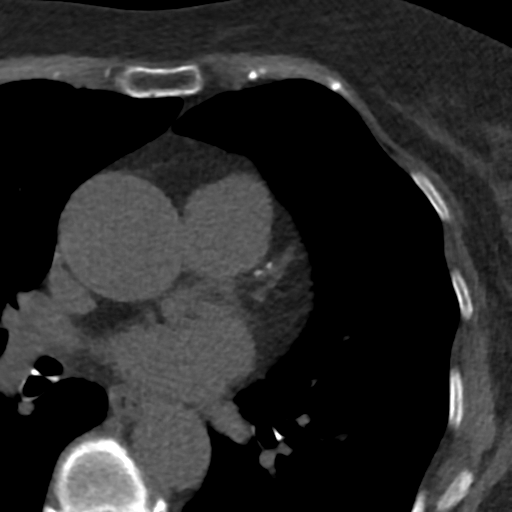
[im 32/42  vessel]
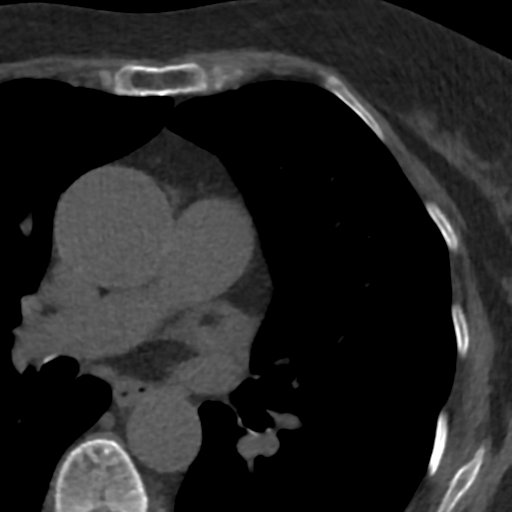
[im 37/42  vessel]
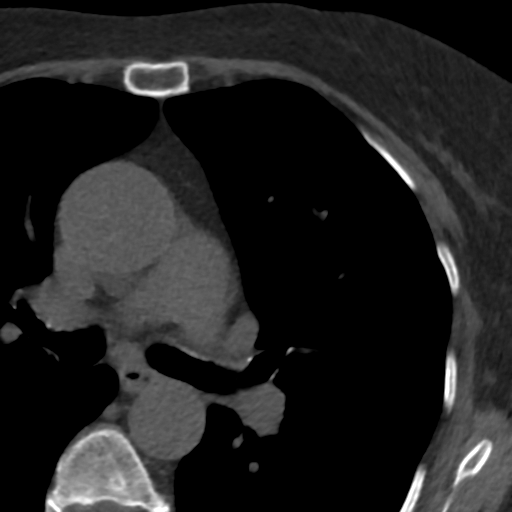

[Series 4: lung st 70 % · axial · 0.57mm/px · z∈[-246,-148]mm · 8 of 43 slices shown]
[im 5/43  lung]
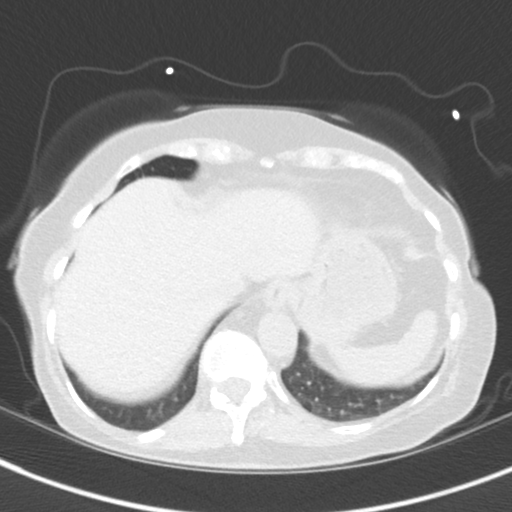
[im 10/43  lung]
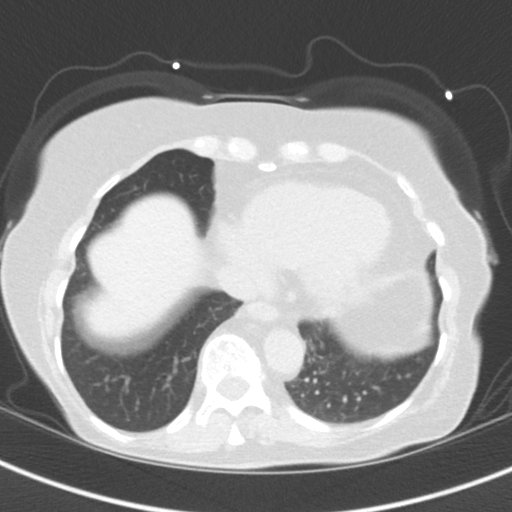
[im 15/43  lung]
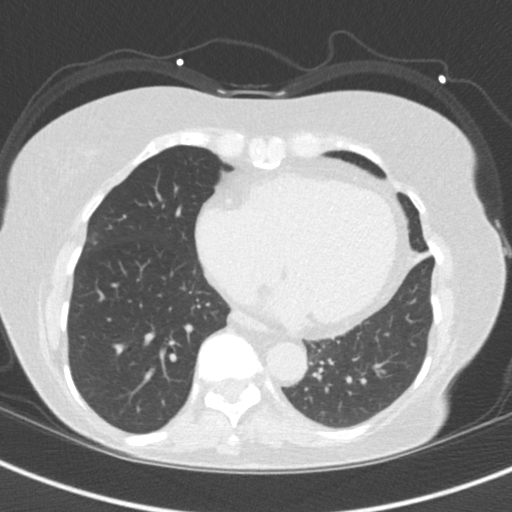
[im 19/43  lung]
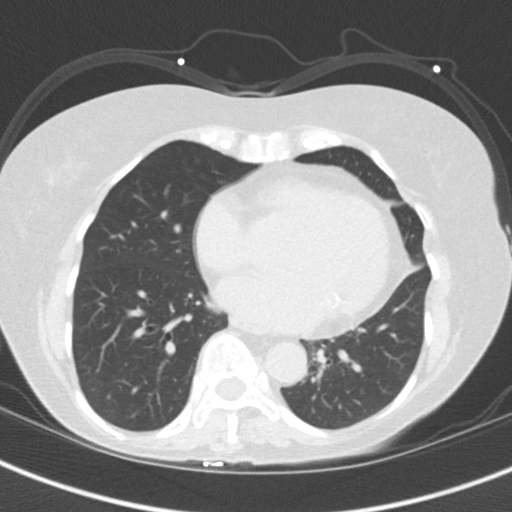
[im 24/43  lung]
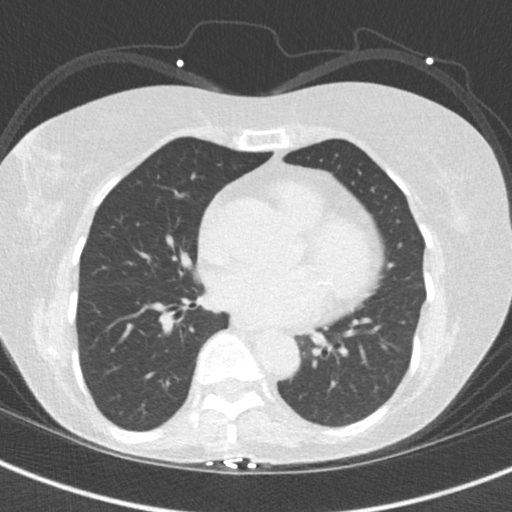
[im 29/43  lung]
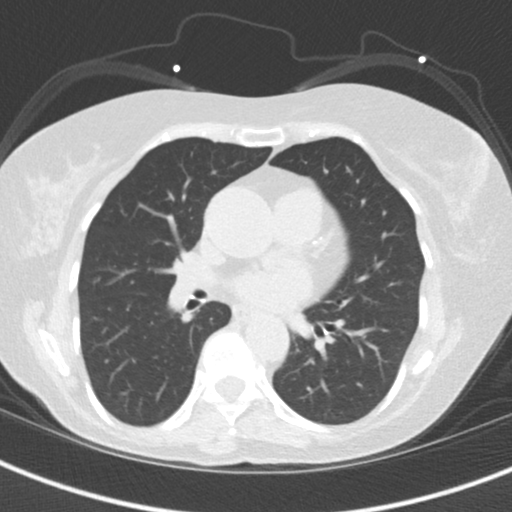
[im 33/43  lung]
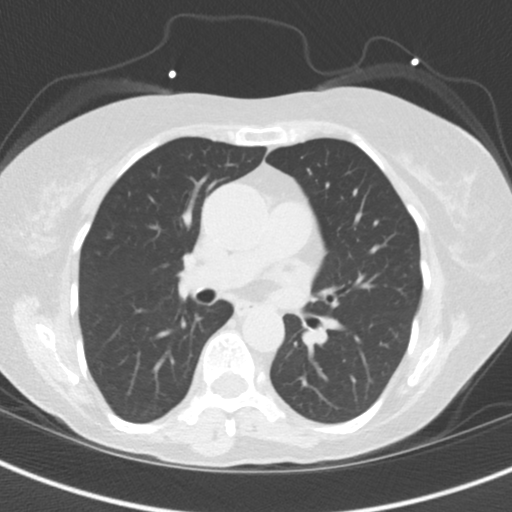
[im 38/43  lung]
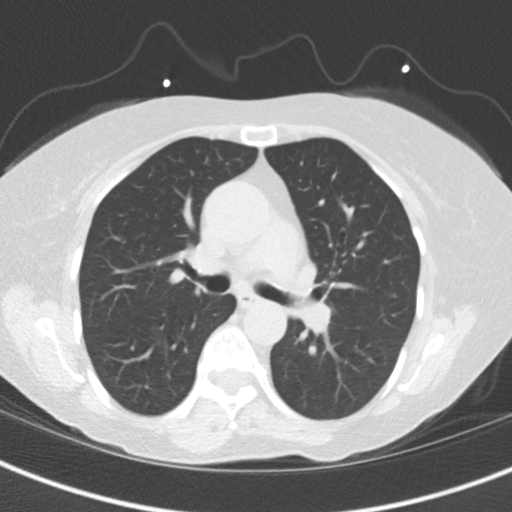

[16 of 20 positions shown; findings below may reference images not displayed]

FINDINGS: Within the visualized portions of the thorax there are no suspicious
appearing pulmonary nodules or masses, there is no acute
consolidative airspace disease, no pleural effusions, no
pneumothorax and no lymphadenopathy. Visualized portions of the
upper abdomen are unremarkable. There are no aggressive appearing
lytic or blastic lesions noted in the visualized portions of the
skeleton.
IMPRESSION: No significant incidental noncardiac findings are noted.
FINDINGS: Non-cardiac: See separate report from [REDACTED].

Ascending Aorta: Borderline dilatation (37.6 mm ascending). No
aortic atherosclerosis.

Pericardium: Normal

Mitral annular calcification noted.

Coronary arteries: Normal origin.  Mild LAD calcification.
IMPRESSION: Coronary calcium score of 18.6. This was 46 percentile for age and
sex matched control.

*** End of Addendum ***
EXAM:
OVER-READ INTERPRETATION  CT CHEST

The following report is an over-read performed by radiologist Dr.
Arushi Zavaleta [REDACTED] on 09/12/2019. This
over-read does not include interpretation of cardiac or coronary
anatomy or pathology. The coronary calcium score interpretation by
the cardiologist is attached.
FINDINGS: Within the visualized portions of the thorax there are no suspicious
appearing pulmonary nodules or masses, there is no acute
consolidative airspace disease, no pleural effusions, no
pneumothorax and no lymphadenopathy. Visualized portions of the
upper abdomen are unremarkable. There are no aggressive appearing
lytic or blastic lesions noted in the visualized portions of the
skeleton.
IMPRESSION: No significant incidental noncardiac findings are noted.

## 2020-11-20 ENCOUNTER — Ambulatory Visit
Admission: RE | Admit: 2020-11-20 | Discharge: 2020-11-20 | Disposition: A | Discharge status: Home / Self Care | Payer: PPO | Source: Ambulatory Visit | Attending: Family Medicine | Admitting: Family Medicine

## 2020-11-20 ENCOUNTER — Other Ambulatory Visit: Payer: Self-pay

## 2020-11-20 DIAGNOSIS — Z1231 Encounter for screening mammogram for malignant neoplasm of breast: Secondary | ICD-10-CM | POA: Diagnosis not present

## 2020-11-21 ENCOUNTER — Ambulatory Visit: Payer: PPO

## 2020-12-02 DIAGNOSIS — B351 Tinea unguium: Secondary | ICD-10-CM | POA: Diagnosis not present

## 2020-12-02 DIAGNOSIS — L600 Ingrowing nail: Secondary | ICD-10-CM | POA: Diagnosis not present

## 2020-12-02 DIAGNOSIS — M79675 Pain in left toe(s): Secondary | ICD-10-CM | POA: Diagnosis not present

## 2020-12-02 DIAGNOSIS — M79674 Pain in right toe(s): Secondary | ICD-10-CM | POA: Diagnosis not present

## 2020-12-12 DIAGNOSIS — R300 Dysuria: Secondary | ICD-10-CM | POA: Diagnosis not present

## 2021-01-01 DIAGNOSIS — E7800 Pure hypercholesterolemia, unspecified: Secondary | ICD-10-CM | POA: Diagnosis not present

## 2021-01-01 DIAGNOSIS — M810 Age-related osteoporosis without current pathological fracture: Secondary | ICD-10-CM | POA: Diagnosis not present

## 2021-01-01 DIAGNOSIS — K219 Gastro-esophageal reflux disease without esophagitis: Secondary | ICD-10-CM | POA: Diagnosis not present

## 2021-01-01 DIAGNOSIS — M8580 Other specified disorders of bone density and structure, unspecified site: Secondary | ICD-10-CM | POA: Diagnosis not present

## 2021-01-10 DIAGNOSIS — E7800 Pure hypercholesterolemia, unspecified: Secondary | ICD-10-CM | POA: Diagnosis not present

## 2021-01-10 DIAGNOSIS — N390 Urinary tract infection, site not specified: Secondary | ICD-10-CM | POA: Diagnosis not present

## 2021-01-10 DIAGNOSIS — H8103 Meniere's disease, bilateral: Secondary | ICD-10-CM | POA: Diagnosis not present

## 2021-01-10 DIAGNOSIS — M8580 Other specified disorders of bone density and structure, unspecified site: Secondary | ICD-10-CM | POA: Diagnosis not present

## 2021-01-15 DIAGNOSIS — Z20822 Contact with and (suspected) exposure to covid-19: Secondary | ICD-10-CM | POA: Diagnosis not present

## 2021-01-15 DIAGNOSIS — Z03818 Encounter for observation for suspected exposure to other biological agents ruled out: Secondary | ICD-10-CM | POA: Diagnosis not present

## 2021-01-15 DIAGNOSIS — J029 Acute pharyngitis, unspecified: Secondary | ICD-10-CM | POA: Diagnosis not present

## 2021-02-20 DIAGNOSIS — M8580 Other specified disorders of bone density and structure, unspecified site: Secondary | ICD-10-CM | POA: Diagnosis not present

## 2021-02-20 DIAGNOSIS — M810 Age-related osteoporosis without current pathological fracture: Secondary | ICD-10-CM | POA: Diagnosis not present

## 2021-02-20 DIAGNOSIS — E7800 Pure hypercholesterolemia, unspecified: Secondary | ICD-10-CM | POA: Diagnosis not present

## 2021-02-20 DIAGNOSIS — K219 Gastro-esophageal reflux disease without esophagitis: Secondary | ICD-10-CM | POA: Diagnosis not present

## 2021-03-03 DIAGNOSIS — L600 Ingrowing nail: Secondary | ICD-10-CM | POA: Diagnosis not present

## 2021-04-02 DIAGNOSIS — R0789 Other chest pain: Secondary | ICD-10-CM | POA: Diagnosis not present

## 2021-04-02 DIAGNOSIS — M25551 Pain in right hip: Secondary | ICD-10-CM | POA: Diagnosis not present

## 2021-04-23 DIAGNOSIS — Z96641 Presence of right artificial hip joint: Secondary | ICD-10-CM | POA: Diagnosis not present

## 2021-04-24 DIAGNOSIS — H8103 Meniere's disease, bilateral: Secondary | ICD-10-CM | POA: Diagnosis not present

## 2021-04-24 DIAGNOSIS — H903 Sensorineural hearing loss, bilateral: Secondary | ICD-10-CM | POA: Diagnosis not present

## 2021-04-24 DIAGNOSIS — H608X9 Other otitis externa, unspecified ear: Secondary | ICD-10-CM | POA: Diagnosis not present

## 2021-04-24 DIAGNOSIS — H9313 Tinnitus, bilateral: Secondary | ICD-10-CM | POA: Diagnosis not present

## 2021-04-24 DIAGNOSIS — R2689 Other abnormalities of gait and mobility: Secondary | ICD-10-CM | POA: Diagnosis not present

## 2021-04-24 DIAGNOSIS — G43109 Migraine with aura, not intractable, without status migrainosus: Secondary | ICD-10-CM | POA: Diagnosis not present

## 2021-05-06 DIAGNOSIS — M25551 Pain in right hip: Secondary | ICD-10-CM | POA: Diagnosis not present

## 2021-05-15 DIAGNOSIS — M25551 Pain in right hip: Secondary | ICD-10-CM | POA: Diagnosis not present

## 2021-05-20 DIAGNOSIS — M25551 Pain in right hip: Secondary | ICD-10-CM | POA: Diagnosis not present

## 2021-05-23 DIAGNOSIS — M25551 Pain in right hip: Secondary | ICD-10-CM | POA: Diagnosis not present

## 2021-05-27 DIAGNOSIS — K219 Gastro-esophageal reflux disease without esophagitis: Secondary | ICD-10-CM | POA: Diagnosis not present

## 2021-05-27 DIAGNOSIS — E7800 Pure hypercholesterolemia, unspecified: Secondary | ICD-10-CM | POA: Diagnosis not present

## 2021-05-27 DIAGNOSIS — M810 Age-related osteoporosis without current pathological fracture: Secondary | ICD-10-CM | POA: Diagnosis not present

## 2021-05-27 DIAGNOSIS — M25551 Pain in right hip: Secondary | ICD-10-CM | POA: Diagnosis not present

## 2021-05-27 DIAGNOSIS — M8580 Other specified disorders of bone density and structure, unspecified site: Secondary | ICD-10-CM | POA: Diagnosis not present

## 2021-05-29 DIAGNOSIS — M25551 Pain in right hip: Secondary | ICD-10-CM | POA: Diagnosis not present

## 2021-06-03 DIAGNOSIS — M25551 Pain in right hip: Secondary | ICD-10-CM | POA: Diagnosis not present

## 2021-06-05 DIAGNOSIS — Z96641 Presence of right artificial hip joint: Secondary | ICD-10-CM | POA: Diagnosis not present

## 2021-06-06 DIAGNOSIS — M25551 Pain in right hip: Secondary | ICD-10-CM | POA: Diagnosis not present

## 2021-06-09 DIAGNOSIS — M25551 Pain in right hip: Secondary | ICD-10-CM | POA: Diagnosis not present

## 2021-06-20 DIAGNOSIS — H524 Presbyopia: Secondary | ICD-10-CM | POA: Diagnosis not present

## 2021-06-20 DIAGNOSIS — Z961 Presence of intraocular lens: Secondary | ICD-10-CM | POA: Diagnosis not present

## 2021-06-24 DIAGNOSIS — E559 Vitamin D deficiency, unspecified: Secondary | ICD-10-CM | POA: Diagnosis not present

## 2021-06-24 DIAGNOSIS — E7800 Pure hypercholesterolemia, unspecified: Secondary | ICD-10-CM | POA: Diagnosis not present

## 2021-07-01 DIAGNOSIS — M810 Age-related osteoporosis without current pathological fracture: Secondary | ICD-10-CM | POA: Diagnosis not present

## 2021-07-01 DIAGNOSIS — K219 Gastro-esophageal reflux disease without esophagitis: Secondary | ICD-10-CM | POA: Diagnosis not present

## 2021-07-01 DIAGNOSIS — E7800 Pure hypercholesterolemia, unspecified: Secondary | ICD-10-CM | POA: Diagnosis not present

## 2021-07-01 DIAGNOSIS — M8580 Other specified disorders of bone density and structure, unspecified site: Secondary | ICD-10-CM | POA: Diagnosis not present

## 2021-07-08 DIAGNOSIS — E7800 Pure hypercholesterolemia, unspecified: Secondary | ICD-10-CM | POA: Diagnosis not present

## 2021-07-08 DIAGNOSIS — Z6820 Body mass index (BMI) 20.0-20.9, adult: Secondary | ICD-10-CM | POA: Diagnosis not present

## 2021-07-08 DIAGNOSIS — L920 Granuloma annulare: Secondary | ICD-10-CM | POA: Diagnosis not present

## 2021-07-08 DIAGNOSIS — M8580 Other specified disorders of bone density and structure, unspecified site: Secondary | ICD-10-CM | POA: Diagnosis not present

## 2021-07-08 DIAGNOSIS — N390 Urinary tract infection, site not specified: Secondary | ICD-10-CM | POA: Diagnosis not present

## 2021-07-11 ENCOUNTER — Other Ambulatory Visit: Payer: Self-pay | Admitting: Family Medicine

## 2021-07-11 DIAGNOSIS — Z1389 Encounter for screening for other disorder: Secondary | ICD-10-CM | POA: Diagnosis not present

## 2021-07-11 DIAGNOSIS — Z1231 Encounter for screening mammogram for malignant neoplasm of breast: Secondary | ICD-10-CM

## 2021-07-11 DIAGNOSIS — Z0000 Encounter for general adult medical examination without abnormal findings: Secondary | ICD-10-CM | POA: Diagnosis not present

## 2021-08-11 NOTE — Progress Notes (Signed)
Cardiology Office Note:    Date:  08/14/2021   ID:  Lori Frost, DOB 1946-03-04, MRN [id number]  PCP:  Sigmund Hazel, MD  [facility] Cardiologist:  Donato Schultz, MD  [facility] Electrophysiologist:  None   Referring MD: Juluis Rainier, MD     History of Present Illness:    Lori Frost is a 75 y.o. female here for follow-up of MR, and hyperlipidemia.  Dr. Donnie Aho prior.   MVP for years  Statin intolerant, has trouble taking higher doses of Crestor secondary to thumb pain/difficulty with grip. Myalgias.   She was allergic to codeine-nausea Crestor/muscle aches Lipitor/muscle aches Topamax/fatigue tramadol rash Zoloft intolerance.  Ever she is able to take low-dose Crestor 3 times a week as well as Zetia.    Echocardiogram in 2019 demonstrated moderate eccentric mitral regurgitation with a previously partial ruptured chorda.  Chamber dimensions were normal.  LVEF 60%.  No significant change in echocardiogram 2022.   She is also had a history of vertigo migraines Mnire's disease.  Prior LDL 144.    Currently 89 with Crestor 3 days a week and Zetia.   Today, she still remains asymptomatic no shortness of breath no chest pain no syncope no fevers.  She walks well.  She does not notice any changes over the past year.   Past Medical History:  Diagnosis Date   Allergy    Non-Allergic Rhinitis   Balance problem due to vestibular dysfunction    Bloating    CAD (coronary artery disease)    Carotid artery occlusion    Cataract    Chordae tendineae rupture (HCC)    Cyst of ovary, right    Depression    Diverticulosis    DJD (degenerative joint disease)    Eczema    Family history of adverse reaction to anesthesia    SISTER "HEART STOPPED DURING [id number]"[id number] UNKNOWN CAUSE -  SHE DIED 10 YRS LATER OF HEART ATTACK   GERD (gastroesophageal reflux disease)    ESOPHAGITIS PRESENCE NOT SPECIFIED   Granuloma annulare    History of hiatal hernia    History of  recurrent UTI (urinary tract infection)    Hypercholesterolemia    Hyperlipidemia    Lower back pain    Meniere's disease    Migraine    Mitral valve regurgitation    MODERATE NORMAL, MILD AORTIC REGURGITATION, MILD TRICUSPID REGURGITATION, LVH, GRADE 1 DIASTOLIC DYSFUNCTION, ECHO 2018 SUGGESTS RUPTURED CHORDAE TENDINEAE ( DR. Donnie Aho)   MVP (mitral valve prolapse)    Myalgia    OA (osteoarthritis) of hip 02/12/2016   Osteoporosis    Other nonrheumatic mitral valve disorders    Psoriasis    Pupil dilated    DUE TO EYE DROPS FOR TX OF RT  EYE PROBLEM   Reactive hypoglycemia    Sensitive skin    Shingles    Vertigo    Vestibular neuronitis    Vitamin D deficiency     Past Surgical History:  Procedure Laterality Date   EYE SURGERY Right 02-05-15   Cataract   EYE SURGERY Left 02-21-15   Cataract   TONSILLECTOMY     TOTAL HIP ARTHROPLASTY Right 02/12/2016   Procedure: RIGHT TOTAL HIP ARTHROPLASTY ANTERIOR APPROACH;  Surgeon: Ollen Gross, MD;  Location: [facility];  Service: Orthopedics;  Laterality: Right;    Current Medications: Current Meds  Medication Sig   acetaminophen (TYLENOL) 325 MG tablet Take 2 tablets (650 mg total) by mouth every 6 (six) hours as needed  for mild pain (or Fever >/= 101).   alendronate (FOSAMAX) 70 MG tablet 1 TABLET ONCE A WEEK SATURDAYS ORALLY 90 DAYS   aspirin 81 MG chewable tablet Chew 81 mg by mouth daily. Give 1 tablet by mouth daily once XARELTO has completed 2 week dose   calcium carbonate (OS-CAL) 600 MG TABS tablet Take by mouth.   cetirizine (ZYRTEC) 10 MG tablet Take by mouth.   Cholecalciferol (VITAMIN D3) 1000 units CAPS Take by mouth.   diazepam (VALIUM) 2 MG tablet Take 2 mg by mouth every 8 (eight) hours as needed. Vertigo.   diphenhydrAMINE (BENADRYL) 12.5 MG/5ML elixir Take 5-10 mLs (12.5-25 mg total) by mouth every 4 (four) hours as needed for itching.   Ezetimibe (ZETIA PO) Take 10 mg by mouth once a week.   famotidine (PEPCID) 10 MG  tablet Take 10 mg by mouth 2 (two) times daily.   rosuvastatin (CRESTOR) 5 MG tablet TAKE 1 TABLET (5 MG TOTAL) BY MOUTH 3 (THREE) TIMES A WEEK.   triamterene-hydrochlorothiazide (MAXZIDE-25) 37.5-25 MG tablet Take 1 tablet by mouth daily.   trimethoprim (TRIMPEX) 100 MG tablet Take 100 mg by mouth daily.     Allergies:   Acetazolamide, Codeine, Sertraline hcl, Statins, Tape, Topamax [topiramate], and Tramadol   Social History   Socioeconomic History   Marital status: Single    Spouse name: Not on file   Number of children: Not on file   Years of education: Not on file   Highest education level: Not on file  Occupational History   Not on file  Tobacco Use   Smoking status: Never   Smokeless tobacco: Never  Vaping Use   Vaping Use: Never used  Substance and Sexual Activity   Alcohol use: No    Alcohol/week: 0.0 standard drinks   Drug use: No   Sexual activity: Not on file  Other Topics Concern   Not on file  Social History Narrative   Not on file   Social Determinants of Health   Financial Resource Strain: Not on file  Food Insecurity: Not on file  Transportation Needs: Not on file  Physical Activity: Not on file  Stress: Not on file  Social Connections: Not on file     Family History: The patient's family history includes Cancer in her sister; Diabetes in her father and mother; Heart attack in her mother and sister.  ROS:   Please see the history of present illness.    (+) All other systems reviewed and are negative.  EKGs/Labs/Other Studies Reviewed:    The following studies were reviewed today:  US Carotid Duplex 09/04/2020: Summary:  Right Carotid: Velocities in the right ICA are consistent with a 1-39%  stenosis.   Left Carotid: Velocities in the left ICA are consistent with a 1-39%  stenosis.   Vertebrals:  Bilateral vertebral arteries demonstrate antegrade flow.  Subclavians: Normal flow hemodynamics were seen in bilateral subclavian arteries.    ECHO 07/29/20  1. Left ventricular ejection fraction, by estimation, is 60 to 65%. The  left ventricle has normal function. The left ventricle has no regional  wall motion abnormalities. There is mild left ventricular hypertrophy.  Left ventricular diastolic parameters  are consistent with Grade I diastolic dysfunction (impaired relaxation).   2. Right ventricular systolic function is normal. The right ventricular  size is normal. There is normal pulmonary artery systolic pressure.   3. The mitral valve is abnormal. Moderate to severe mitral valve  regurgitation.   4. The  aortic valve is tricuspid. Aortic valve regurgitation is mild.  Mild aortic valve sclerosis is present, with no evidence of aortic valve  stenosis.   5. Aortic dilatation noted. There is mild dilatation of the ascending  aorta measuring 38 mm.   6. The inferior vena cava is normal in size with greater than 50%  respiratory variability, suggesting right atrial pressure of 3 mmHg.   ECHO 08/12/21:   1. Left ventricular ejection fraction, by estimation, is 60 to 65%. The  left ventricle has normal function. The left ventricle has no regional  wall motion abnormalities. Left ventricular diastolic parameters were  normal.   2. Right ventricular systolic function is low normal. The right  ventricular size is normal.   3. The MR jet is eccentric and directed anteriorly. The MR appears  similar to the prior echo in 2021. . The mitral valve is grossly normal.  Moderate to severe mitral valve regurgitation. There is mild late systolic  prolapse of posterior of the mitral  valve.   4. Tricuspid valve regurgitation is mild to moderate.   5. The aortic valve is normal in structure. Aortic valve regurgitation is  mild.   6. Aortic dilatation noted. There is mild dilatation of the ascending  aorta, measuring 38 mm.   7. The inferior vena cava is normal in size with greater than 50%  respiratory variability, suggesting right  atrial pressure of 3 mmHg.   CT Cardiac Scoring 09/12/2019: FINDINGS: Non-cardiac: See separate report from [facility].   Ascending Aorta: Borderline dilatation (37.6 mm ascending). No aortic atherosclerosis.   Pericardium: Normal   Mitral annular calcification noted.   Coronary arteries: Normal origin.  Mild LAD calcification.   IMPRESSION: Coronary calcium score of 18.6. This was 25 percentile for age and sex matched control.  EKG:  EKG is personally reviewed and interpreted. 08/14/2021: Sinus rhythm 70 no other abnormalities 08/14/2020: sinus rhythm 71 no other abnormalities  Recent Labs: No results found for requested labs within last 8760 hours.  Recent Lipid Panel No results found for: CHOL, TRIG, HDL, CHOLHDL, VLDL, LDLCALC, LDLDIRECT  Physical Exam:    VS:  BP 114/70 (BP Location: Left Arm, Patient Position: Sitting, Cuff Size: Normal)   Pulse 70   Ht 5\' 1"  (1.549 m)   Wt 112 lb (50.8 kg)   SpO2 96%   BMI 21.16 kg/m     Wt Readings from Last 3 Encounters:  08/14/21 112 lb (50.8 kg)  08/14/20 109 lb 12.8 oz (49.8 kg)  08/15/19 112 lb 12.8 oz (51.2 kg)     GEN: Well nourished, well developed in no acute distress HEENT: Normal NECK: No JVD; carotid bruits noted, may be radiation of murmur LYMPHATICS: No lymphadenopathy CARDIAC: RRR, 3/6 systolic murmur, no rubs, gallops RESPIRATORY:  Clear to auscultation without rales, wheezing or rhonchi  ABDOMEN: Soft, non-tender, non-distended MUSCULOSKELETAL: Minimal left LLE edema; No deformity  SKIN: Warm and dry NEUROLOGIC:  Alert and oriented x 3 PSYCHIATRIC:  Normal affect    ASSESSMENT:    1. Nonrheumatic mitral valve regurgitation   2. Coronary artery calcification   3. Aortic atherosclerosis (HCC)   4. Pure hypercholesterolemia   5. Bilateral carotid artery stenosis     PLAN:    In order of problems listed above: Mitral valve regurgitation Echocardiogram updated as above.  Stable.  Moderate  to severe eccentric mitral regurgitation.  She is NYHA class I.  She is not having any symptoms with this.  It has been  stable over the past several years.  No evidence of significant structural changes at this point with her heart.  We will continue to monitor.  Obviously if symptoms worsen or become more worrisome, we will have low threshold for referral to structural heart clinic for more potential mitral valve clip/repair.  Coronary artery calcification Coronary calcium score previously 19.  46 percentile.  She is on statin 3 times a week maximally tolerated as well as Zetia.  Her last LDL was 80.  Much improved.  Aortic atherosclerosis (HCC) Continue with aspirin, maximally tolerated statin and Zetia.  Hyperlipidemia Continue with maximally tolerated statin Crestor 3 times a week 5 mg as noted above.  She is also on Zetia 10 mg a day.  Refills as needed for this medication management.  Last LDL 80.  Excellent.  ALT 17.  Much improved.  Carotid artery disease (HCC) Mild carotid artery disease updated 2021 vascular study personally reviewed.  Continue with goal-directed medical therapy, aspirin, maximally tolerated statin, Zetia, hypertension control and exercise.  Excellent.  Follow-up: 1 year with ECHO  Medication Adjustments/Labs and Tests Ordered: Current medicines are reviewed at length with the patient today.  Concerns regarding medicines are outlined above.   Orders Placed This Encounter  Procedures   ECHOCARDIOGRAM COMPLETE    No orders of the defined types were placed in this encounter.  Patient Instructions   Medication Instructions:  The current medical regimen is effective;  continue present plan and medications.  *If you need a refill on your cardiac medications before your next appointment, please call your pharmacy*  Testing/Procedures: Your physician has requested that you have an echocardiogram in 1 year. Echocardiography is a painless test that uses sound waves  to create images of your heart. It provides your doctor with information about the size and shape of your heart and how well your heart's chambers and valves are working. This procedure takes approximately one hour. There are no restrictions for this procedure. Follow-Up: At [facility], you and your health needs are our priority.  As part of our continuing mission to provide you with exceptional heart care, we have created designated Provider Care Teams.  These Care Teams include your primary Cardiologist (physician) and Advanced Practice Providers (APPs -  Physician Assistants and Nurse Practitioners) who all work together to provide you with the care you need, when you need it.  We recommend signing up for the patient portal called "MyChart".  Sign up information is provided on this After Visit Summary.  MyChart is used to connect with patients for Virtual Visits (Telemedicine).  Patients are able to view lab/test results, encounter notes, upcoming appointments, etc.  Non-urgent messages can be sent to your provider as well.   To learn more about what you can do with MyChart, go to [email or web address].    Your next appointment:   1 year(s)  The format for your next appointment:   In Person  Provider:   Donato SchultzMark Lanora Reveron, MD   Thank you for choosing [facility]!!      Signed, Donato SchultzMark Daquarius Dubeau, MD  08/14/2021 9:37 AM    [facility]

## 2021-08-12 ENCOUNTER — Other Ambulatory Visit: Payer: Self-pay

## 2021-08-12 ENCOUNTER — Ambulatory Visit (HOSPITAL_COMMUNITY): Payer: PPO | Attending: Cardiovascular Disease

## 2021-08-12 DIAGNOSIS — I340 Nonrheumatic mitral (valve) insufficiency: Secondary | ICD-10-CM | POA: Diagnosis not present

## 2021-08-12 LAB — ECHOCARDIOGRAM COMPLETE
Area-P 1/2: 3.63 cm2
P 1/2 time: 530 msec
S' Lateral: 3 cm

## 2021-08-14 ENCOUNTER — Encounter: Payer: Self-pay | Admitting: Cardiology

## 2021-08-14 ENCOUNTER — Other Ambulatory Visit: Payer: Self-pay

## 2021-08-14 ENCOUNTER — Ambulatory Visit: Payer: PPO | Admitting: Cardiology

## 2021-08-14 DIAGNOSIS — I2510 Atherosclerotic heart disease of native coronary artery without angina pectoris: Secondary | ICD-10-CM

## 2021-08-14 DIAGNOSIS — I700 Atherosclerosis of aorta: Secondary | ICD-10-CM | POA: Diagnosis not present

## 2021-08-14 DIAGNOSIS — I2584 Coronary atherosclerosis due to calcified coronary lesion: Secondary | ICD-10-CM

## 2021-08-14 DIAGNOSIS — I6523 Occlusion and stenosis of bilateral carotid arteries: Secondary | ICD-10-CM | POA: Diagnosis not present

## 2021-08-14 DIAGNOSIS — E7800 Pure hypercholesterolemia, unspecified: Secondary | ICD-10-CM

## 2021-08-14 DIAGNOSIS — I779 Disorder of arteries and arterioles, unspecified: Secondary | ICD-10-CM | POA: Insufficient documentation

## 2021-08-14 DIAGNOSIS — E785 Hyperlipidemia, unspecified: Secondary | ICD-10-CM | POA: Insufficient documentation

## 2021-08-14 DIAGNOSIS — I340 Nonrheumatic mitral (valve) insufficiency: Secondary | ICD-10-CM

## 2021-08-14 NOTE — Patient Instructions (Signed)
Medication Instructions:  The current medical regimen is effective;  continue present plan and medications.  *If you need a refill on your cardiac medications before your next appointment, please call your pharmacy*  Testing/Procedures: Your physician has requested that you have an echocardiogram in 1 year. Echocardiography is a painless test that uses sound waves to create images of your heart. It provides your doctor with information about the size and shape of your heart and how well your heart's chambers and valves are working. This procedure takes approximately one hour. There are no restrictions for this procedure.  Follow-Up: At [facility], you and your health needs are our priority.  As part of our continuing mission to provide you with exceptional heart care, we have created designated Provider Care Teams.  These Care Teams include your primary Cardiologist (physician) and Advanced Practice Providers (APPs -  Physician Assistants and Nurse Practitioners) who all work together to provide you with the care you need, when you need it.  We recommend signing up for the patient portal called "MyChart".  Sign up information is provided on this After Visit Summary.  MyChart is used to connect with patients for Virtual Visits (Telemedicine).  Patients are able to view lab/test results, encounter notes, upcoming appointments, etc.  Non-urgent messages can be sent to your provider as well.   To learn more about what you can do with MyChart, go to [email or web address].    Your next appointment:   1 year(s)  The format for your next appointment:   In Person  Provider:   Mark Skains, MD   Thank you for choosing [facility]!!    

## 2021-08-14 NOTE — Assessment & Plan Note (Signed)
Continue with aspirin, maximally tolerated statin and Zetia.

## 2021-08-14 NOTE — Assessment & Plan Note (Signed)
Echocardiogram updated as above.  Stable.  Moderate to severe eccentric mitral regurgitation.  She is NYHA class I.  She is not having any symptoms with this.  It has been stable over the past several years.  No evidence of significant structural changes at this point with her heart.  We will continue to monitor.  Obviously if symptoms worsen or become more worrisome, we will have low threshold for referral to structural heart clinic for more potential mitral valve clip/repair.

## 2021-08-14 NOTE — Assessment & Plan Note (Signed)
Mild carotid artery disease updated 2021 vascular study personally reviewed.  Continue with goal-directed medical therapy, aspirin, maximally tolerated statin, Zetia, hypertension control and exercise.  Excellent.

## 2021-08-14 NOTE — Assessment & Plan Note (Signed)
Continue with maximally tolerated statin Crestor 3 times a week 5 mg as noted above.  She is also on Zetia 10 mg a day.  Refills as needed for this medication management.  Last LDL 80.  Excellent.  ALT 17.  Much improved.

## 2021-08-14 NOTE — Assessment & Plan Note (Signed)
Coronary calcium score previously 19.  46 percentile.  She is on statin 3 times a week maximally tolerated as well as Zetia.  Her last LDL was 80.  Much improved.

## 2021-08-20 NOTE — Addendum Note (Signed)
Addended by: Louanne Belton, Mykia Holton A on: 08/20/2021 08:25 AM   Modules accepted: Orders

## 2021-10-01 DIAGNOSIS — L920 Granuloma annulare: Secondary | ICD-10-CM | POA: Diagnosis not present

## 2021-10-01 DIAGNOSIS — L218 Other seborrheic dermatitis: Secondary | ICD-10-CM | POA: Diagnosis not present

## 2021-10-01 DIAGNOSIS — Z111 Encounter for screening for respiratory tuberculosis: Secondary | ICD-10-CM | POA: Diagnosis not present

## 2021-10-08 DIAGNOSIS — K219 Gastro-esophageal reflux disease without esophagitis: Secondary | ICD-10-CM | POA: Diagnosis not present

## 2021-10-08 DIAGNOSIS — E7800 Pure hypercholesterolemia, unspecified: Secondary | ICD-10-CM | POA: Diagnosis not present

## 2021-10-08 DIAGNOSIS — M810 Age-related osteoporosis without current pathological fracture: Secondary | ICD-10-CM | POA: Diagnosis not present

## 2021-10-08 DIAGNOSIS — M8580 Other specified disorders of bone density and structure, unspecified site: Secondary | ICD-10-CM | POA: Diagnosis not present

## 2021-10-14 DIAGNOSIS — M79642 Pain in left hand: Secondary | ICD-10-CM | POA: Diagnosis not present

## 2021-10-14 DIAGNOSIS — R202 Paresthesia of skin: Secondary | ICD-10-CM | POA: Diagnosis not present

## 2021-10-14 DIAGNOSIS — M25571 Pain in right ankle and joints of right foot: Secondary | ICD-10-CM | POA: Diagnosis not present

## 2021-10-21 DIAGNOSIS — Z111 Encounter for screening for respiratory tuberculosis: Secondary | ICD-10-CM | POA: Diagnosis not present

## 2021-11-10 DIAGNOSIS — Z111 Encounter for screening for respiratory tuberculosis: Secondary | ICD-10-CM | POA: Diagnosis not present

## 2021-11-19 DIAGNOSIS — M810 Age-related osteoporosis without current pathological fracture: Secondary | ICD-10-CM | POA: Diagnosis not present

## 2021-11-19 DIAGNOSIS — K219 Gastro-esophageal reflux disease without esophagitis: Secondary | ICD-10-CM | POA: Diagnosis not present

## 2021-11-19 DIAGNOSIS — E7800 Pure hypercholesterolemia, unspecified: Secondary | ICD-10-CM | POA: Diagnosis not present

## 2021-11-19 DIAGNOSIS — M8580 Other specified disorders of bone density and structure, unspecified site: Secondary | ICD-10-CM | POA: Diagnosis not present

## 2021-11-21 ENCOUNTER — Ambulatory Visit
Admission: RE | Admit: 2021-11-21 | Discharge: 2021-11-21 | Disposition: A | Discharge status: Home / Self Care | Payer: PPO | Source: Ambulatory Visit | Attending: Family Medicine | Admitting: Family Medicine

## 2021-11-21 DIAGNOSIS — Z1231 Encounter for screening mammogram for malignant neoplasm of breast: Secondary | ICD-10-CM

## 2021-11-26 ENCOUNTER — Other Ambulatory Visit: Payer: Self-pay | Admitting: Family Medicine

## 2021-11-26 DIAGNOSIS — Z1231 Encounter for screening mammogram for malignant neoplasm of breast: Secondary | ICD-10-CM

## 2021-12-01 DIAGNOSIS — U071 COVID-19: Secondary | ICD-10-CM | POA: Diagnosis not present

## 2021-12-01 DIAGNOSIS — B349 Viral infection, unspecified: Secondary | ICD-10-CM | POA: Diagnosis not present

## 2021-12-22 DIAGNOSIS — L920 Granuloma annulare: Secondary | ICD-10-CM | POA: Diagnosis not present

## 2022-01-15 ENCOUNTER — Other Ambulatory Visit: Payer: Self-pay | Admitting: Cardiology

## 2022-04-27 DIAGNOSIS — R350 Frequency of micturition: Secondary | ICD-10-CM | POA: Diagnosis not present

## 2022-04-30 DIAGNOSIS — H608X9 Other otitis externa, unspecified ear: Secondary | ICD-10-CM | POA: Diagnosis not present

## 2022-04-30 DIAGNOSIS — H903 Sensorineural hearing loss, bilateral: Secondary | ICD-10-CM | POA: Diagnosis not present

## 2022-04-30 DIAGNOSIS — H8103 Meniere's disease, bilateral: Secondary | ICD-10-CM | POA: Diagnosis not present

## 2022-04-30 DIAGNOSIS — H9313 Tinnitus, bilateral: Secondary | ICD-10-CM | POA: Diagnosis not present

## 2022-04-30 DIAGNOSIS — G43109 Migraine with aura, not intractable, without status migrainosus: Secondary | ICD-10-CM | POA: Diagnosis not present

## 2022-07-01 DIAGNOSIS — Z961 Presence of intraocular lens: Secondary | ICD-10-CM | POA: Diagnosis not present

## 2022-07-01 DIAGNOSIS — H04123 Dry eye syndrome of bilateral lacrimal glands: Secondary | ICD-10-CM | POA: Diagnosis not present

## 2022-07-06 DIAGNOSIS — Z79899 Other long term (current) drug therapy: Secondary | ICD-10-CM | POA: Diagnosis not present

## 2022-07-15 DIAGNOSIS — E7800 Pure hypercholesterolemia, unspecified: Secondary | ICD-10-CM | POA: Diagnosis not present

## 2022-07-15 DIAGNOSIS — N390 Urinary tract infection, site not specified: Secondary | ICD-10-CM | POA: Diagnosis not present

## 2022-07-15 DIAGNOSIS — Z6820 Body mass index (BMI) 20.0-20.9, adult: Secondary | ICD-10-CM | POA: Diagnosis not present

## 2022-07-15 DIAGNOSIS — Z974 Presence of external hearing-aid: Secondary | ICD-10-CM | POA: Diagnosis not present

## 2022-07-15 DIAGNOSIS — M8580 Other specified disorders of bone density and structure, unspecified site: Secondary | ICD-10-CM | POA: Diagnosis not present

## 2022-07-15 DIAGNOSIS — Z0000 Encounter for general adult medical examination without abnormal findings: Secondary | ICD-10-CM | POA: Diagnosis not present

## 2022-07-15 DIAGNOSIS — H8103 Meniere's disease, bilateral: Secondary | ICD-10-CM | POA: Diagnosis not present

## 2022-07-17 ENCOUNTER — Other Ambulatory Visit: Payer: Self-pay | Admitting: Family Medicine

## 2022-07-17 DIAGNOSIS — M810 Age-related osteoporosis without current pathological fracture: Secondary | ICD-10-CM

## 2022-07-27 ENCOUNTER — Ambulatory Visit (HOSPITAL_COMMUNITY): Payer: PPO | Attending: Cardiology

## 2022-07-27 DIAGNOSIS — I340 Nonrheumatic mitral (valve) insufficiency: Secondary | ICD-10-CM | POA: Insufficient documentation

## 2022-07-27 LAB — ECHOCARDIOGRAM COMPLETE
Area-P 1/2: 4.15 cm2
P 1/2 time: 429 msec
S' Lateral: 3 cm

## 2022-08-11 ENCOUNTER — Ambulatory Visit: Payer: PPO | Attending: Cardiology | Admitting: Cardiology

## 2022-08-11 ENCOUNTER — Encounter: Payer: Self-pay | Admitting: Cardiology

## 2022-08-11 VITALS — BP 130/70 | HR 71 | Ht 61.0 in | Wt 109.0 lb

## 2022-08-11 DIAGNOSIS — I340 Nonrheumatic mitral (valve) insufficiency: Secondary | ICD-10-CM

## 2022-08-11 DIAGNOSIS — I2510 Atherosclerotic heart disease of native coronary artery without angina pectoris: Secondary | ICD-10-CM | POA: Diagnosis not present

## 2022-08-11 DIAGNOSIS — I700 Atherosclerosis of aorta: Secondary | ICD-10-CM | POA: Diagnosis not present

## 2022-08-11 DIAGNOSIS — E7800 Pure hypercholesterolemia, unspecified: Secondary | ICD-10-CM | POA: Diagnosis not present

## 2022-08-11 DIAGNOSIS — I2584 Coronary atherosclerosis due to calcified coronary lesion: Secondary | ICD-10-CM | POA: Diagnosis not present

## 2022-08-11 NOTE — Patient Instructions (Signed)
Medication Instructions:  The current medical regimen is effective;  continue present plan and medications.  *If you need a refill on your cardiac medications before your next appointment, please call your pharmacy*  Testing/Procedures: Your physician has requested that you have an echocardiogram in 1 yr. Echocardiography is a painless test that uses sound waves to create images of your heart. It provides your doctor with information about the size and shape of your heart and how well your heart's chambers and valves are working. This procedure takes approximately one hour. There are no restrictions for this procedure.  Follow-Up: At [facility], you and your health needs are our priority.  As part of our continuing mission to provide you with exceptional heart care, we have created designated Provider Care Teams.  These Care Teams include your primary Cardiologist (physician) and Advanced Practice Providers (APPs -  Physician Assistants and Nurse Practitioners) who all work together to provide you with the care you need, when you need it.  We recommend signing up for the patient portal called "MyChart".  Sign up information is provided on this After Visit Summary.  MyChart is used to connect with patients for Virtual Visits (Telemedicine).  Patients are able to view lab/test results, encounter notes, upcoming appointments, etc.  Non-urgent messages can be sent to your provider as well.   To learn more about what you can do with MyChart, go to [email or web address].    Your next appointment:   1 year(s)  The format for your next appointment:   In Person  Provider:   Donato Schultz, MD      Important Information About Sugar

## 2022-08-11 NOTE — Progress Notes (Signed)
Cardiology Office Note:    Date:  08/11/2022   ID:  Lori Frost, DOB August 16, 1946, MRN [id number]  PCP:  Kathyrn Lass, MD  [facility] Cardiologist:  Candee Furbish, MD  [facility] Electrophysiologist:  None   Referring MD: Kathyrn Lass, MD     History of Present Illness:    Lori Frost is a 76 y.o. female here for 1 year follow-up of mitral regurgitation, and hyperlipidemia.  Dr. Wynonia Lawman prior.   MVP for years  Statin intolerant, has trouble taking higher doses of Crestor secondary to thumb pain/difficulty with grip. Myalgias.  Able to tolerate 3 times a week Crestor.  She was allergic to codeine-nausea Crestor/muscle aches Lipitor/muscle aches Topamax/fatigue tramadol rash Zoloft intolerance.  Ever she is able to take low-dose Crestor 3 times a week as well as Zetia.  Echocardiogram in 2019 demonstrated moderate eccentric mitral regurgitation with a previously partial ruptured chorda.  Chamber dimensions were normal.  LVEF 60%.  No significant change in echocardiogram 2022.   She also had prior vertigo migraines Mnire's disease.  Prior LDL 144. Then improved with 97 with Crestor 3 days a week and Zetia.  Today:  She has been feeling well since her last visit.   When she is lying down she describes having palpitations that feel like a quick " bump bump". She has felt it mostly in her neck. This has been pretty normal for her, no changes in the frequency.   She also experiences some LLE edema that stays fairly consistent and is somewhat painful. Currently she is wearing compression stockings.  She has been having some joint pain.  She is currently taking Zetia 10 mg PO SID. Also she confirms taking rosuvastatin three days a week. No significant myalgias on this dosage.  She denies any chest pain, shortness of breath. No lightheadedness, headaches, syncope, orthopnea, or PND.   Most recent echocardiogram was done two weeks prior (07/27/22) showed no significant  changes.   She has had a shingles vaccination and did not feel well for a day or so afterwards. In October she is scheduled to receive her second shingles vaccination.   Past Medical History:  Diagnosis Date   Allergy    Non-Allergic Rhinitis   Balance problem due to vestibular dysfunction    Bloating    CAD (coronary artery disease)    Carotid artery occlusion    Cataract    Chordae tendineae rupture (HCC)    Cyst of ovary, right    Depression    Diverticulosis    DJD (degenerative joint disease)    Eczema    Family history of adverse reaction to anesthesia    SISTER "HEART STOPPED DURING SO:[id number] UNKNOWN CAUSE -  SHE DIED 10 YRS LATER OF HEART ATTACK   GERD (gastroesophageal reflux disease)    ESOPHAGITIS PRESENCE NOT SPECIFIED   Granuloma annulare    History of hiatal hernia    History of recurrent UTI (urinary tract infection)    Hypercholesterolemia    Hyperlipidemia    Lower back pain    Meniere's disease    Migraine    Mitral valve regurgitation    MODERATE NORMAL, MILD AORTIC REGURGITATION, MILD TRICUSPID REGURGITATION, LVH, GRADE 1 DIASTOLIC DYSFUNCTION, ECHO 2018 SUGGESTS RUPTURED CHORDAE TENDINEAE ( DR. Wynonia Lawman)   MVP (mitral valve prolapse)    Myalgia    OA (osteoarthritis) of hip 02/12/2016   Osteoporosis    Other nonrheumatic mitral valve disorders    Psoriasis    Pupil  dilated    DUE TO EYE DROPS FOR TX OF RT  EYE PROBLEM   Reactive hypoglycemia    Sensitive skin    Shingles    Vertigo    Vestibular neuronitis    Vitamin D deficiency     Past Surgical History:  Procedure Laterality Date   EYE SURGERY Right 02-05-15   Cataract   EYE SURGERY Left 02-21-15   Cataract   TONSILLECTOMY     TOTAL HIP ARTHROPLASTY Right 02/12/2016   Procedure: RIGHT TOTAL HIP ARTHROPLASTY ANTERIOR APPROACH;  Surgeon: Gaynelle Arabian, MD;  Location: [facility];  Service: Orthopedics;  Laterality: Right;    Current Medications: Current Meds  Medication Sig   acetaminophen  (TYLENOL) 325 MG tablet Take 2 tablets (650 mg total) by mouth every 6 (six) hours as needed for mild pain (or Fever >/= 101).   aspirin 81 MG chewable tablet Chew 81 mg by mouth daily. Give 1 tablet by mouth daily once XARELTO has completed 2 week dose   calcium carbonate (OS-CAL) 600 MG TABS tablet Take by mouth.   cetirizine (ZYRTEC) 10 MG tablet Take by mouth.   Cholecalciferol (VITAMIN D3) 1000 units CAPS Take by mouth.   diphenhydrAMINE (BENADRYL) 12.5 MG/5ML elixir Take 5-10 mLs (12.5-25 mg total) by mouth every 4 (four) hours as needed for itching.   ezetimibe (ZETIA) 10 MG tablet Take 10 mg by mouth daily.   famotidine (PEPCID) 10 MG tablet Take 10 mg by mouth 2 (two) times daily.   rosuvastatin (CRESTOR) 5 MG tablet TAKE 1 TABLET (5 MG TOTAL) BY MOUTH 3 (THREE) TIMES A WEEK.   triamterene-hydrochlorothiazide (MAXZIDE-25) 37.5-25 MG tablet Take 1 tablet by mouth daily.   trimethoprim (TRIMPEX) 100 MG tablet Take 100 mg by mouth daily.     Allergies:   Acetazolamide, Codeine, Sertraline hcl, Statins, Tape, Topamax [topiramate], and Tramadol   Social History   Socioeconomic History   Marital status: Single    Spouse name: Not on file   Number of children: Not on file   Years of education: Not on file   Highest education level: Not on file  Occupational History   Not on file  Tobacco Use   Smoking status: Never   Smokeless tobacco: Never  Vaping Use   Vaping Use: Never used  Substance and Sexual Activity   Alcohol use: No    Alcohol/week: 0.0 standard drinks of alcohol   Drug use: No   Sexual activity: Not on file  Other Topics Concern   Not on file  Social History Narrative   Not on file   Social Determinants of Health   Financial Resource Strain: Not on file  Food Insecurity: Not on file  Transportation Needs: Not on file  Physical Activity: Not on file  Stress: Not on file  Social Connections: Not on file     Family History: The patient's family history  includes Cancer in her sister; Diabetes in her father and mother; Heart attack in her mother and sister. There is no history of Breast cancer.  ROS:   Please see the history of present illness.    (+) LLE edema  (+)  LLE soreness (+) Arthralgias (+) Easy bruising All other systems reviewed and are negative.  EKGs/Labs/Other Studies Reviewed:    The following studies were reviewed today:  ECHO 07/27/22: IMPRESSIONS    1. Left ventricular ejection fraction, by estimation, is 55 to 60%. The  left ventricle has normal function. The left ventricle has  no regional  wall motion abnormalities. There is mild concentric left ventricular  hypertrophy. Left ventricular diastolic  function could not be evaluated.   2. Right ventricular systolic function is normal. The right ventricular  size is normal.   3. Moderate posterior annular calcification. The posterior mitral leaflet  is mildly prolapse. The mitral valve is normal in structure. Moderate to  severe mitral valve regurgitation. No evidence of mitral stenosis.   4. The aortic valve is normal in structure. Aortic valve regurgitation is  mild to moderate. No aortic stenosis is present.   5. There is borderline dilatation of the aortic root, measuring 36 mm.  There is borderline dilatation of the ascending aorta, measuring 36 mm.   6. The inferior vena cava is normal in size with greater than 50%  respiratory variability, suggesting right atrial pressure of 3 mmHg.   ECHO 08/12/21:   1. Left ventricular ejection fraction, by estimation, is 60 to 65%. The  left ventricle has normal function. The left ventricle has no regional  wall motion abnormalities. Left ventricular diastolic parameters were  normal.   2. Right ventricular systolic function is low normal. The right  ventricular size is normal.   3. The MR jet is eccentric and directed anteriorly. The MR appears  similar to the prior echo in 2021. . The mitral valve is grossly  normal.  Moderate to severe mitral valve regurgitation. There is mild late systolic  prolapse of posterior of the mitral  valve.   4. Tricuspid valve regurgitation is mild to moderate.   5. The aortic valve is normal in structure. Aortic valve regurgitation is  mild.   6. Aortic dilatation noted. There is mild dilatation of the ascending  aorta, measuring 38 mm.   7. The inferior vena cava is normal in size with greater than 50%  respiratory variability, suggesting right atrial pressure of 3 mmHg.   US Carotid Duplex 09/04/2020: Summary:  Right Carotid: Velocities in the right ICA are consistent with a 1-39%  stenosis.   Left Carotid: Velocities in the left ICA are consistent with a 1-39%  stenosis.   Vertebrals:  Bilateral vertebral arteries demonstrate antegrade flow.  Subclavians: Normal flow hemodynamics were seen in bilateral subclavian arteries.   ECHO 07/29/20  1. Left ventricular ejection fraction, by estimation, is 60 to 65%. The  left ventricle has normal function. The left ventricle has no regional  wall motion abnormalities. There is mild left ventricular hypertrophy.  Left ventricular diastolic parameters  are consistent with Grade I diastolic dysfunction (impaired relaxation).   2. Right ventricular systolic function is normal. The right ventricular  size is normal. There is normal pulmonary artery systolic pressure.   3. The mitral valve is abnormal. Moderate to severe mitral valve  regurgitation.   4. The aortic valve is tricuspid. Aortic valve regurgitation is mild.  Mild aortic valve sclerosis is present, with no evidence of aortic valve  stenosis.   5. Aortic dilatation noted. There is mild dilatation of the ascending  aorta measuring 38 mm.   6. The inferior vena cava is normal in size with greater than 50%  respiratory variability, suggesting right atrial pressure of 3 mmHg.   CT Cardiac Scoring 09/12/2019: FINDINGS: Non-cardiac: See separate report from  [facility].   Ascending Aorta: Borderline dilatation (37.6 mm ascending). No aortic atherosclerosis.   Pericardium: Normal   Mitral annular calcification noted.   Coronary arteries: Normal origin.  Mild LAD calcification.   IMPRESSION: Coronary calcium score  of 18.6. This was 68 percentile for age and sex matched control.  EKG:  EKG is personally reviewed and interpreted. 08/11/2022: Sinus rhythm. Rate 71 bpm.  08/14/2021: Sinus rhythm 70 no other abnormalities 08/14/2020: sinus rhythm 71 no other abnormalities  Recent Labs: No results found for requested labs within last 365 days.  Recent Lipid Panel No results found for: "CHOL", "TRIG", "HDL", "CHOLHDL", "VLDL", "LDLCALC", "LDLDIRECT"  Physical Exam:    VS:  BP 130/70 (BP Location: Left Arm, Patient Position: Sitting, Cuff Size: Normal)   Pulse 71   Ht 5\' 1"  (1.549 m)   Wt 109 lb (49.4 kg)   SpO2 95%   BMI 20.60 kg/m     Wt Readings from Last 3 Encounters:  08/11/22 109 lb (49.4 kg)  08/14/21 112 lb (50.8 kg)  08/14/20 109 lb 12.8 oz (49.8 kg)     GEN: Well nourished, well developed in no acute distress HEENT: Normal NECK: No JVD; carotid bruits noted, may be radiation of murmur LYMPHATICS: No lymphadenopathy CARDIAC: RRR, 3/6 mid-systolic murmur, no rubs, gallops RESPIRATORY:  Clear to auscultation without rales, wheezing or rhonchi  ABDOMEN: Soft, non-tender, non-distended MUSCULOSKELETAL: Minimal left LLE edema with tenderness; No deformity  SKIN: Warm and dry; Ecchymoses of bilateral forearms NEUROLOGIC:  Alert and oriented x 3 PSYCHIATRIC:  Normal affect    ASSESSMENT:    1. Nonrheumatic mitral valve regurgitation   2. Coronary artery calcification   3. Pure hypercholesterolemia   4. Aortic atherosclerosis (HCC)      PLAN:    In order of problems listed above: Mitral valve regurgitation Echocardiogram updated as above.  Stable.  Moderate to severe eccentric mitral regurgitation.  She is  NYHA class I.  She is not having any symptoms with this.  It has been stable over the past several years.  No evidence of significant structural changes at this point with her heart.  We will continue to monitor.  Obviously if symptoms worsen or become more worrisome, we will have low threshold for referral to structural heart clinic for more potential mitral valve clip/repair.   Coronary artery calcification Coronary calcium score previously 19.  46 percentile.  She is on statin 3 times a week maximally tolerated as well as Zetia.  Her last LDL was 80.  Much improved.   Aortic atherosclerosis ([place]) Continue with aspirin, maximally tolerated statin and Zetia.   Hyperlipidemia Continue with maximally tolerated statin Crestor 3 times a week 5 mg as noted above.  She is also on Zetia 10 mg a day.  Refills as needed for this medication management.  Last LDL 80.  Excellent.  ALT 17.  Much improved.   Carotid artery disease (HCC) Mild carotid artery disease updated 2021 vascular study personally reviewed.  Continue with goal-directed medical therapy, aspirin, maximally tolerated statin, Zetia, hypertension control and exercise.  Excellent.    Follow-up: 1 year  Medication Adjustments/Labs and Tests Ordered: Current medicines are reviewed at length with the patient today.  Concerns regarding medicines are outlined above.   Orders Placed This Encounter  Procedures   EKG 12-Lead   ECHOCARDIOGRAM COMPLETE    No orders of the defined types were placed in this encounter.   Patient Instructions  Medication Instructions:  The current medical regimen is effective;  continue present plan and medications.  *If you need a refill on your cardiac medications before your next appointment, please call your pharmacy*  Testing/Procedures: Your physician has requested that you have an echocardiogram in 1  yr. Echocardiography is a painless test that uses sound waves to create images of your heart. It  provides your doctor with information about the size and shape of your heart and how well your heart's chambers and valves are working. This procedure takes approximately one hour. There are no restrictions for this procedure.  Follow-Up: At [facility], you and your health needs are our priority.  As part of our continuing mission to provide you with exceptional heart care, we have created designated Provider Care Teams.  These Care Teams include your primary Cardiologist (physician) and Advanced Practice Providers (APPs -  Physician Assistants and Nurse Practitioners) who all work together to provide you with the care you need, when you need it.  We recommend signing up for the patient portal called "MyChart".  Sign up information is provided on this After Visit Summary.  MyChart is used to connect with patients for Virtual Visits (Telemedicine).  Patients are able to view lab/test results, encounter notes, upcoming appointments, etc.  Non-urgent messages can be sent to your provider as well.   To learn more about what you can do with MyChart, go to [email or web address].    Your next appointment:   1 year(s)  The format for your next appointment:   In Person  Provider:   Donato Schultz, MD      Important Information About Sugar          Jodelle Gross Ford,acting as a scribe for Donato Schultz, MD.,have documented all relevant documentation on the behalf of Donato Schultz, MD,as directed by  Donato Schultz, MD while in the presence of Donato Schultz, MD.   I, Donato Schultz, MD, have reviewed all documentation for this visit. The documentation on 08/11/22 for the exam, diagnosis, procedures, and orders are all accurate and complete.   Signed, Donato Schultz, MD  08/11/2022 9:59 AM    [facility]

## 2022-09-08 DIAGNOSIS — M542 Cervicalgia: Secondary | ICD-10-CM | POA: Diagnosis not present

## 2022-09-08 DIAGNOSIS — R0781 Pleurodynia: Secondary | ICD-10-CM | POA: Diagnosis not present

## 2022-09-08 DIAGNOSIS — M546 Pain in thoracic spine: Secondary | ICD-10-CM | POA: Diagnosis not present

## 2022-09-30 DIAGNOSIS — M546 Pain in thoracic spine: Secondary | ICD-10-CM | POA: Diagnosis not present

## 2022-09-30 DIAGNOSIS — M542 Cervicalgia: Secondary | ICD-10-CM | POA: Diagnosis not present

## 2022-10-06 ENCOUNTER — Other Ambulatory Visit: Payer: Self-pay | Admitting: Family Medicine

## 2022-10-06 DIAGNOSIS — Z1231 Encounter for screening mammogram for malignant neoplasm of breast: Secondary | ICD-10-CM

## 2022-10-07 DIAGNOSIS — M546 Pain in thoracic spine: Secondary | ICD-10-CM | POA: Diagnosis not present

## 2022-10-07 DIAGNOSIS — M542 Cervicalgia: Secondary | ICD-10-CM | POA: Diagnosis not present

## 2022-10-09 DIAGNOSIS — M542 Cervicalgia: Secondary | ICD-10-CM | POA: Diagnosis not present

## 2022-10-09 DIAGNOSIS — M546 Pain in thoracic spine: Secondary | ICD-10-CM | POA: Diagnosis not present

## 2022-10-12 DIAGNOSIS — M542 Cervicalgia: Secondary | ICD-10-CM | POA: Diagnosis not present

## 2022-10-12 DIAGNOSIS — M546 Pain in thoracic spine: Secondary | ICD-10-CM | POA: Diagnosis not present

## 2022-10-15 DIAGNOSIS — M542 Cervicalgia: Secondary | ICD-10-CM | POA: Diagnosis not present

## 2022-10-15 DIAGNOSIS — M546 Pain in thoracic spine: Secondary | ICD-10-CM | POA: Diagnosis not present

## 2022-10-19 DIAGNOSIS — M546 Pain in thoracic spine: Secondary | ICD-10-CM | POA: Diagnosis not present

## 2022-10-19 DIAGNOSIS — M542 Cervicalgia: Secondary | ICD-10-CM | POA: Diagnosis not present

## 2022-10-21 DIAGNOSIS — L218 Other seborrheic dermatitis: Secondary | ICD-10-CM | POA: Diagnosis not present

## 2022-10-21 DIAGNOSIS — D369 Benign neoplasm, unspecified site: Secondary | ICD-10-CM | POA: Diagnosis not present

## 2022-10-21 DIAGNOSIS — L920 Granuloma annulare: Secondary | ICD-10-CM | POA: Diagnosis not present

## 2022-11-23 ENCOUNTER — Ambulatory Visit: Payer: PPO

## 2022-11-30 ENCOUNTER — Ambulatory Visit
Admission: RE | Admit: 2022-11-30 | Discharge: 2022-11-30 | Disposition: A | Discharge status: Home / Self Care | Payer: PPO | Source: Ambulatory Visit | Attending: Family Medicine | Admitting: Family Medicine

## 2022-11-30 DIAGNOSIS — Z1231 Encounter for screening mammogram for malignant neoplasm of breast: Secondary | ICD-10-CM

## 2022-12-22 DIAGNOSIS — R194 Change in bowel habit: Secondary | ICD-10-CM | POA: Diagnosis not present

## 2023-01-08 ENCOUNTER — Ambulatory Visit
Admission: RE | Admit: 2023-01-08 | Discharge: 2023-01-08 | Disposition: A | Discharge status: Home / Self Care | Payer: PPO | Source: Ambulatory Visit | Attending: Family Medicine | Admitting: Family Medicine

## 2023-01-08 DIAGNOSIS — Z780 Asymptomatic menopausal state: Secondary | ICD-10-CM | POA: Diagnosis not present

## 2023-01-08 DIAGNOSIS — M810 Age-related osteoporosis without current pathological fracture: Secondary | ICD-10-CM

## 2023-01-08 DIAGNOSIS — M8589 Other specified disorders of bone density and structure, multiple sites: Secondary | ICD-10-CM | POA: Diagnosis not present

## 2023-01-19 DIAGNOSIS — R350 Frequency of micturition: Secondary | ICD-10-CM | POA: Diagnosis not present

## 2023-01-19 DIAGNOSIS — N3941 Urge incontinence: Secondary | ICD-10-CM | POA: Diagnosis not present

## 2023-01-19 DIAGNOSIS — R3914 Feeling of incomplete bladder emptying: Secondary | ICD-10-CM | POA: Diagnosis not present

## 2023-01-19 DIAGNOSIS — N812 Incomplete uterovaginal prolapse: Secondary | ICD-10-CM | POA: Diagnosis not present

## 2023-02-11 DIAGNOSIS — R194 Change in bowel habit: Secondary | ICD-10-CM | POA: Diagnosis not present

## 2023-02-11 DIAGNOSIS — R140 Abdominal distension (gaseous): Secondary | ICD-10-CM | POA: Diagnosis not present

## 2023-02-25 ENCOUNTER — Other Ambulatory Visit: Payer: Self-pay | Admitting: Cardiology

## 2023-04-21 DIAGNOSIS — L920 Granuloma annulare: Secondary | ICD-10-CM | POA: Diagnosis not present

## 2023-05-27 DIAGNOSIS — R2689 Other abnormalities of gait and mobility: Secondary | ICD-10-CM | POA: Diagnosis not present

## 2023-05-27 DIAGNOSIS — H8103 Meniere's disease, bilateral: Secondary | ICD-10-CM | POA: Diagnosis not present

## 2023-05-27 DIAGNOSIS — G43109 Migraine with aura, not intractable, without status migrainosus: Secondary | ICD-10-CM | POA: Diagnosis not present

## 2023-05-27 DIAGNOSIS — H903 Sensorineural hearing loss, bilateral: Secondary | ICD-10-CM | POA: Diagnosis not present

## 2023-05-28 DIAGNOSIS — H04123 Dry eye syndrome of bilateral lacrimal glands: Secondary | ICD-10-CM | POA: Diagnosis not present

## 2023-05-28 DIAGNOSIS — Z961 Presence of intraocular lens: Secondary | ICD-10-CM | POA: Diagnosis not present

## 2023-05-28 DIAGNOSIS — H524 Presbyopia: Secondary | ICD-10-CM | POA: Diagnosis not present

## 2023-05-28 DIAGNOSIS — H35362 Drusen (degenerative) of macula, left eye: Secondary | ICD-10-CM | POA: Diagnosis not present

## 2023-07-15 DIAGNOSIS — H8103 Meniere's disease, bilateral: Secondary | ICD-10-CM | POA: Diagnosis not present

## 2023-07-15 DIAGNOSIS — Z681 Body mass index (BMI) 19 or less, adult: Secondary | ICD-10-CM | POA: Diagnosis not present

## 2023-07-15 DIAGNOSIS — R42 Dizziness and giddiness: Secondary | ICD-10-CM | POA: Diagnosis not present

## 2023-07-15 DIAGNOSIS — R110 Nausea: Secondary | ICD-10-CM | POA: Diagnosis not present

## 2023-07-16 DIAGNOSIS — E538 Deficiency of other specified B group vitamins: Secondary | ICD-10-CM | POA: Diagnosis not present

## 2023-07-16 DIAGNOSIS — R42 Dizziness and giddiness: Secondary | ICD-10-CM | POA: Diagnosis not present

## 2023-07-16 DIAGNOSIS — R110 Nausea: Secondary | ICD-10-CM | POA: Diagnosis not present

## 2023-07-16 DIAGNOSIS — E559 Vitamin D deficiency, unspecified: Secondary | ICD-10-CM | POA: Diagnosis not present

## 2023-07-16 DIAGNOSIS — E785 Hyperlipidemia, unspecified: Secondary | ICD-10-CM | POA: Diagnosis not present

## 2023-07-19 DIAGNOSIS — I700 Atherosclerosis of aorta: Secondary | ICD-10-CM | POA: Diagnosis not present

## 2023-07-19 DIAGNOSIS — Z0000 Encounter for general adult medical examination without abnormal findings: Secondary | ICD-10-CM | POA: Diagnosis not present

## 2023-07-19 DIAGNOSIS — E559 Vitamin D deficiency, unspecified: Secondary | ICD-10-CM | POA: Diagnosis not present

## 2023-07-19 DIAGNOSIS — E7800 Pure hypercholesterolemia, unspecified: Secondary | ICD-10-CM | POA: Diagnosis not present

## 2023-07-19 DIAGNOSIS — Z974 Presence of external hearing-aid: Secondary | ICD-10-CM | POA: Diagnosis not present

## 2023-07-19 DIAGNOSIS — N3281 Overactive bladder: Secondary | ICD-10-CM | POA: Diagnosis not present

## 2023-07-19 DIAGNOSIS — M810 Age-related osteoporosis without current pathological fracture: Secondary | ICD-10-CM | POA: Diagnosis not present

## 2023-07-19 DIAGNOSIS — L409 Psoriasis, unspecified: Secondary | ICD-10-CM | POA: Diagnosis not present

## 2023-07-19 DIAGNOSIS — G43909 Migraine, unspecified, not intractable, without status migrainosus: Secondary | ICD-10-CM | POA: Diagnosis not present

## 2023-07-19 DIAGNOSIS — I779 Disorder of arteries and arterioles, unspecified: Secondary | ICD-10-CM | POA: Diagnosis not present

## 2023-07-19 DIAGNOSIS — K219 Gastro-esophageal reflux disease without esophagitis: Secondary | ICD-10-CM | POA: Diagnosis not present

## 2023-07-19 DIAGNOSIS — Z681 Body mass index (BMI) 19 or less, adult: Secondary | ICD-10-CM | POA: Diagnosis not present

## 2023-07-27 DIAGNOSIS — H8103 Meniere's disease, bilateral: Secondary | ICD-10-CM | POA: Diagnosis not present

## 2023-08-03 ENCOUNTER — Ambulatory Visit: Payer: PPO | Attending: Family Medicine | Admitting: Physical Therapy

## 2023-08-03 ENCOUNTER — Other Ambulatory Visit: Payer: Self-pay

## 2023-08-03 DIAGNOSIS — R2689 Other abnormalities of gait and mobility: Secondary | ICD-10-CM | POA: Diagnosis not present

## 2023-08-03 DIAGNOSIS — R2681 Unsteadiness on feet: Secondary | ICD-10-CM | POA: Diagnosis not present

## 2023-08-03 DIAGNOSIS — R42 Dizziness and giddiness: Secondary | ICD-10-CM | POA: Diagnosis not present

## 2023-08-03 NOTE — Therapy (Signed)
OUTPATIENT PHYSICAL THERAPY VESTIBULAR EVALUATION     Patient Name: Lori Frost MRN: [id number] DOB:[birth year], 77 y.o., female Today's Date: 08/03/2023  END OF SESSION:  PT End of Session - 08/03/23 1052     Visit Number 1    Number of Visits 13    Date for PT Re-Evaluation 09/17/23    Authorization Type HTA    PT Start Time 1100    PT Stop Time 1147    PT Time Calculation (min) 47 min    Equipment Utilized During Treatment Gait belt   would require gait belt due to imbalance   Activity Tolerance Patient tolerated treatment well    Behavior During Therapy WFL for tasks assessed/performed             Past Medical History:  Diagnosis Date   Allergy    Non-Allergic Rhinitis   Balance problem due to vestibular dysfunction    Bloating    CAD (coronary artery disease)    Carotid artery occlusion    Cataract    Chordae tendineae rupture (HCC)    Cyst of ovary, right    Depression    Diverticulosis    DJD (degenerative joint disease)    Eczema    Family history of adverse reaction to anesthesia    SISTER "HEART STOPPED DURING [id number]"0347 UNKNOWN CAUSE -  SHE DIED 10 YRS LATER OF HEART ATTACK   GERD (gastroesophageal reflux disease)    ESOPHAGITIS PRESENCE NOT SPECIFIED   Granuloma annulare    History of hiatal hernia    History of recurrent UTI (urinary tract infection)    Hypercholesterolemia    Hyperlipidemia    Lower back pain    Meniere's disease    Migraine    Mitral valve regurgitation    MODERATE NORMAL, MILD AORTIC REGURGITATION, MILD TRICUSPID REGURGITATION, LVH, GRADE 1 DIASTOLIC DYSFUNCTION, ECHO 2018 SUGGESTS RUPTURED CHORDAE TENDINEAE ( DR. Donnie Aho)   MVP (mitral valve prolapse)    Myalgia    OA (osteoarthritis) of hip 02/12/2016   Osteoporosis    Other nonrheumatic mitral valve disorders    Psoriasis    Pupil dilated    DUE TO EYE DROPS FOR TX OF RT  EYE PROBLEM   Reactive hypoglycemia    Sensitive skin    Shingles    Vertigo     Vestibular neuronitis    Vitamin D deficiency    Past Surgical History:  Procedure Laterality Date   EYE SURGERY Right 02-05-15   Cataract   EYE SURGERY Left 02-21-15   Cataract   TONSILLECTOMY     TOTAL HIP ARTHROPLASTY Right 02/12/2016   Procedure: RIGHT TOTAL HIP ARTHROPLASTY ANTERIOR APPROACH;  Surgeon: Ollen Gross, MD;  Location: [facility];  Service: Orthopedics;  Laterality: Right;   Patient Active Problem List   Diagnosis Date Noted   Mitral valve regurgitation 08/14/2021   Coronary artery calcification 08/14/2021   Aortic atherosclerosis (HCC) 08/14/2021   Hyperlipidemia 08/14/2021   Carotid artery disease (HCC) 08/14/2021   OA (osteoarthritis) of hip 02/12/2016    PCP: Sigmund Hazel, MD  REFERRING PROVIDER: Sigmund Hazel, MD   REFERRING DIAG: H81.03 (ICD-10-CM) - Meniere's disease, bilateral   THERAPY DIAG:  Dizziness and giddiness  Unsteadiness on feet  Other abnormalities of gait and mobility  ONSET DATE: 07/28/2023 (MD referral)  Rationale for Evaluation and Treatment: Rehabilitation  SUBJECTIVE:   SUBJECTIVE STATEMENT: Had major vertigo attack in 2012, hx of migraines.  Have history of Meniere's disease and have had therapy  in the past (at [facility]).  Have been quite a bit better for >10 years, and manage my symptoms to be steady.  Had a migraine July 16th (with aura) and 29th awoke with vertigo and migraine, lasting 3 days.  Typically taking Valium will help with migraine/vertigo.  Had another 3 day bout with migraine and vertigo 07/22/23.  Saw PCP and sent me to PT. Pt accompanied by: self  PERTINENT HISTORY: PER MD NOTE:  She consulted Dr. Ladona Horns on 07/12/2023 due to vertigo and migraines, which were associated with her Meniere's disease. The episodes occurred at 3 a.m. on 07/12/2023, she woke up with a spinning sensation that lasted for 3 days. The subsequent episode on 07/22/2023, which started at 7 p.m., lasted for 3 days. The following day, she felt lightheaded  and dizzy, preventing her from attending church. She has a history of similar episodes, but none have occurred in years. During these episodes, she is unable to focus on one item and finds it difficult to walk. She has taken Valium twice, which provided some relief and allowed her to sleep. She still experiences some symptoms upon waking, but they are not as severe.   Since 07/22/2023, she has been feeling much better, albeit fatigued. Today, she still experiences a sensation of her head being asleep, but no spinning. She has experienced tinnitus and slight hearing loss due to her Meniere's disease and bilateral vestibular imbalance, which she believes has worsened her balance issues. She is unable to be in crowds and needs to focus on her needs. Driving is difficult for her due to these episodes. She had a couple of migraines last year, but this vertigo and migraine combination has been some time. She has been taking a diuretic daily for years, which has helped, from [facility] who dx the Menieres. She has limited caffeine and follows a low-sodium diet as per her doctor's advice. [facility] gave her a referral for vestibular physical therapy, but wanted to make sure they did the correct kind. She did not go since she felt fine 2 mos ago at her check up. Last vertigo attack was years ago. She has 2 tablets of Valium left. She was diagnosed with migraines in her 55s.   PAIN:  Are you having pain?  Baseline arthritis pain  PRECAUTIONS: Fall and Other: hx of Meniere's disease  RED FLAGS: None   WEIGHT BEARING RESTRICTIONS: No  FALLS: Has patient fallen in last 6 months? No  LIVING ENVIRONMENT: Lives with: lives alone Lives in: House/apartment Stairs:  1 step into home Has following equipment at home: Single point cane  PLOF: Independent and Leisure: participates in choir activities at church  PATIENT GOALS: To get back to baseline levels of balance/dizziness  OBJECTIVE:   DIAGNOSTIC FINDINGS: NA  for this bout of therapy  COGNITION: Overall cognitive status: Within functional limits for tasks assessed    Cervical ROM:   Grossly tested [facility], but some soreness noted R cervical spin Active A/PROM (deg) eval  Flexion   Extension   Right lateral flexion   Left lateral flexion   Right rotation   Left rotation   (Blank rows = not tested)  TRANSFERS: Assistive device utilized: None  Sit to stand: Modified independence Stand to sit: Modified independence  GAIT: Gait pattern:  guarded gait, reaches for items/furniture to steady, step through pattern, decreased arm swing- Right, decreased arm swing- Left, and wide BOS Distance walked: 30 ft Assistive device utilized: None Level of assistance: Modified independence  and SBA Comments: slowed, guarded pattern; pt reports this is normal per her baseline  PATIENT SURVEYS:  FOTO Intake 57 (risk adjusted 54); predicted 57  VESTIBULAR ASSESSMENT:  GENERAL OBSERVATION: Guarded gait pattern, slowed movements in general   SYMPTOM BEHAVIOR:  Subjective history: Had major vertigo attack in 2012, hx of migraines.  Have history of Meniere's disease and have had therapy in the past (at [facility]).  Have been quite a bit better for >10 years, and manage my symptoms to be steady.  Had a migraine July 16th (with aura) and 29th awoke with vertigo and migraine, lasting 3 days.  Typically taking Valium will help with migraine/vertigo.  Had another 3 day bout with migraine and vertigo 07/22/23.  Saw PCP and sent me to PT.  Non-Vestibular symptoms: changes in hearing, tinnitus, and migraine symptoms  Type of dizziness: Imbalance (Disequilibrium), Unsteady with head/body turns, "Funny feeling in the head", "World moves", and unsteadiness with people moving around   Describes migraines with aura  Frequency: 2 bouts of 3 days in past month  Duration: 3 days  Aggravating factors: Induced by position change: rolling to the left and sit to stand and Worse  outside or in busy environment  Relieving factors: head stationary, slow movements, and Valium for migraines as needed  Progression of symptoms:  2 major episodes in past months; have not regained to baseline (mild imbalance-bilat vestibular hypofunction) Baseline symptoms 3/10 at eval Gets pulled to R side with gait (prolonged history)  OCULOMOTOR EXAM: (wears glasses, trifocals); reports sometimes she has to look away from fast movements  Ocular Alignment: normal  Ocular ROM: No Limitations and slowed reports mild increase in dizziness  Spontaneous Nystagmus: absent  Gaze-Induced Nystagmus:  end range nystagmus  Smooth Pursuits: saccades  Saccades:  overall intact, one quick movement return to center  Convergence/Divergence: 14 inches   VESTIBULAR - OCULAR REFLEX:   Slow VOR: Comment: slowed  VOR Cancellation: Corrective Saccades  Head-Impulse Test: HIT Right: positive HIT Left: positive Mild increase in symptoms; after this testing, rates symptoms as 4/10     M-CTSIB  Condition 1: Firm Surface, EO 30 Sec, Mild Sway  Condition 2: Firm Surface, EC 30 Sec, Mild and Moderate Sway  Condition 3: Foam Surface, EO 30 Sec, Moderate Sway  Condition 4: Foam Surface, EC 17.69 Sec, Severe Sway    MOTION SENSITIVITY:  Motion Sensitivity Quotient Intensity: 0 = none, 1 = Lightheaded, 2 = Mild, 3 = Moderate, 4 = Severe, 5 = Vomiting  Intensity  1. Sitting to supine   2. Supine to L side   3. Supine to R side   4. Supine to sitting   5. L Hallpike-Dix   6. Up from L    7. R Hallpike-Dix   8. Up from R    9. Sitting, head tipped to L knee   10. Head up from L knee   11. Sitting, head tipped to R knee   12. Head up from R knee   13. Sitting head turns x5   14.Sitting head nods x5   15. In stance, 180 turn to L    16. In stance, 180 turn to R        VESTIBULAR TREATMENT:  DATE:  08/03/2023   PATIENT EDUCATION: Education details: PT eval results, POC Person educated: Patient Education method: Explanation Education comprehension: verbalized understanding  HOME EXERCISE PROGRAM:  GOALS: Goals reviewed with patient? Yes  SHORT TERM GOALS: Target date: 09/03/2023  Pt will be independent with HEP for improved balance, dizziness. Baseline: Goal status: INITIAL  2.  FGA to be assessed with goal to be written as appropriate. Baseline:  Goal status: INITIAL  3.  Pt will complete MCTSIB Condition 4 for 30 seconds moderate sway for improved vestibular system use for balance. Baseline:  Goal status: INITIAL  LONG TERM GOALS: Target date: 09/17/2023  Pt will be independent with HEP for improved balance, dizziness. Baseline:  Goal status: INITIAL  2.  Pt will improve FOTO score to at least 57 to demonstrate improved overall functional mobility. Baseline: (risk adjusted score 54 at baseline) Goal status: INITIAL  3.  Pt will report return to baseline balance and dizziness (due to pt's hx of Meniere's disease), compared to prior to onset of recent bouts of 3-day vertigo symptoms. Baseline:  Goal status: INITIAL   ASSESSMENT:  CLINICAL IMPRESSION: Patient is a 77 y.o. female who was seen today for physical therapy evaluation and treatment for Meniere's disease.   She has long-standing (and sounds like well-managed) Meniere's disease, having seen neurologist at [facility], as well as vestibular rehab in 2012.  She reports no significant or new problems from Meniere's disease, until about one month ago.  In past month, she has had 2 bouts of migraine onset with 3 days of vertigo symptoms.  At this point, she does not report room spinning dizziness, just more provoking symptoms with head and eye movements.  Significant decreased vestibular system use for balance noted on MCTSIB test.  She will benefit from skilled PT to work on habituation exercises and compensations for  decreased balance for overall improved functional mobility and return to prior level of function.  OBJECTIVE IMPAIRMENTS: Abnormal gait, decreased balance, and dizziness.   ACTIVITY LIMITATIONS: sitting, standing, transfers, bed mobility, reach over head, and locomotion level  PARTICIPATION LIMITATIONS: driving, community activity, and church  PERSONAL FACTORS: 3+ comorbidities: see above  are also affecting patient's functional outcome.   REHAB POTENTIAL: Good  CLINICAL DECISION MAKING: Evolving/moderate complexity  EVALUATION COMPLEXITY: Moderate   PLAN:  PT FREQUENCY: 1-2x/week  PT DURATION: 6 weeks plus eval  PLANNED INTERVENTIONS: Therapeutic exercises, Therapeutic activity, Neuromuscular re-education, Balance training, Gait training, Patient/Family education, Self Care, Vestibular training, Canalith repositioning, and Manual therapy  PLAN FOR NEXT SESSION: Initiate HEP for vestibular system retraining, balance, VOR and gaze stabilization exercise for habituation.  Has hx of neck pain, so monitor neck with head motions.  Assess FGA and write goal   Gean Maidens., PT 08/03/2023, 11:52 AM   [facility] at [facility] [address] [place], [address] [place], [place] [address] Phone # [phone] Fax # [phone]

## 2023-08-09 ENCOUNTER — Ambulatory Visit (HOSPITAL_COMMUNITY): Payer: PPO | Attending: Cardiology

## 2023-08-09 DIAGNOSIS — I2510 Atherosclerotic heart disease of native coronary artery without angina pectoris: Secondary | ICD-10-CM | POA: Insufficient documentation

## 2023-08-09 DIAGNOSIS — I2584 Coronary atherosclerosis due to calcified coronary lesion: Secondary | ICD-10-CM | POA: Insufficient documentation

## 2023-08-09 DIAGNOSIS — I340 Nonrheumatic mitral (valve) insufficiency: Secondary | ICD-10-CM | POA: Diagnosis not present

## 2023-08-09 LAB — ECHOCARDIOGRAM COMPLETE
Area-P 1/2: 3.74 cm2
MV M vel: 4.52 m/s
MV Peak grad: 81.7 mmHg
P 1/2 time: 432 msec
Radius: 0.8 cm
S' Lateral: 3.1 cm

## 2023-08-10 ENCOUNTER — Encounter: Payer: Self-pay | Admitting: Physical Therapy

## 2023-08-10 ENCOUNTER — Ambulatory Visit: Payer: PPO | Admitting: Physical Therapy

## 2023-08-10 DIAGNOSIS — R2689 Other abnormalities of gait and mobility: Secondary | ICD-10-CM

## 2023-08-10 DIAGNOSIS — R42 Dizziness and giddiness: Secondary | ICD-10-CM | POA: Diagnosis not present

## 2023-08-10 DIAGNOSIS — R2681 Unsteadiness on feet: Secondary | ICD-10-CM

## 2023-08-10 NOTE — Therapy (Signed)
OUTPATIENT PHYSICAL THERAPY VESTIBULAR TREATMENT NOTE     Patient Name: Lori Frost MRN: [id number] DOB:December 09, 1946, 77 y.o., female Today's Date: 08/10/2023  END OF SESSION:  PT End of Session - 08/10/23 1051     Visit Number 2    Number of Visits 13    Date for PT Re-Evaluation 09/17/23    Authorization Type HTA    PT Start Time 1057    PT Stop Time 1143    PT Time Calculation (min) 46 min    Equipment Utilized During Treatment Gait belt   would require gait belt due to imbalance   Activity Tolerance Patient tolerated treatment well    Behavior During Therapy WFL for tasks assessed/performed              Past Medical History:  Diagnosis Date   Allergy    Non-Allergic Rhinitis   Balance problem due to vestibular dysfunction    Bloating    CAD (coronary artery disease)    Carotid artery occlusion    Cataract    Chordae tendineae rupture (HCC)    Cyst of ovary, right    Depression    Diverticulosis    DJD (degenerative joint disease)    Eczema    Family history of adverse reaction to anesthesia    SISTER "HEART STOPPED DURING [id number]"[id number] UNKNOWN CAUSE -  SHE DIED 10 YRS LATER OF HEART ATTACK   GERD (gastroesophageal reflux disease)    ESOPHAGITIS PRESENCE NOT SPECIFIED   Granuloma annulare    History of hiatal hernia    History of recurrent UTI (urinary tract infection)    Hypercholesterolemia    Hyperlipidemia    Lower back pain    Meniere's disease    Migraine    Mitral valve regurgitation    MODERATE NORMAL, MILD AORTIC REGURGITATION, MILD TRICUSPID REGURGITATION, LVH, GRADE 1 DIASTOLIC DYSFUNCTION, ECHO 2018 SUGGESTS RUPTURED CHORDAE TENDINEAE ( DR. Donnie Aho)   MVP (mitral valve prolapse)    Myalgia    OA (osteoarthritis) of hip 02/12/2016   Osteoporosis    Other nonrheumatic mitral valve disorders    Psoriasis    Pupil dilated    DUE TO EYE DROPS FOR TX OF RT  EYE PROBLEM   Reactive hypoglycemia    Sensitive skin    Shingles    Vertigo     Vestibular neuronitis    Vitamin D deficiency    Past Surgical History:  Procedure Laterality Date   EYE SURGERY Right 02-05-15   Cataract   EYE SURGERY Left 02-21-15   Cataract   TONSILLECTOMY     TOTAL HIP ARTHROPLASTY Right 02/12/2016   Procedure: RIGHT TOTAL HIP ARTHROPLASTY ANTERIOR APPROACH;  Surgeon: Ollen Gross, MD;  Location: [facility];  Service: Orthopedics;  Laterality: Right;   Patient Active Problem List   Diagnosis Date Noted   Mitral valve regurgitation 08/14/2021   Coronary artery calcification 08/14/2021   Aortic atherosclerosis (HCC) 08/14/2021   Hyperlipidemia 08/14/2021   Carotid artery disease (HCC) 08/14/2021   OA (osteoarthritis) of hip 02/12/2016    PCP: Sigmund Hazel, MD  REFERRING PROVIDER: Sigmund Hazel, MD   REFERRING DIAG: H81.03 (ICD-10-CM) - Meniere's disease, bilateral   THERAPY DIAG:  Dizziness and giddiness  Unsteadiness on feet  Other abnormalities of gait and mobility  ONSET DATE: 07/28/2023 (MD referral)  Rationale for Evaluation and Treatment: Rehabilitation  SUBJECTIVE:   SUBJECTIVE STATEMENT: Had some wooziness yesterday with light sensitivity.  It cleared once I got out of the light.  Feeling fine today, not quite back to where I was before these episodes.  Pt accompanied by: self  PERTINENT HISTORY: PER MD NOTE:  She consulted Dr. Ladona Horns on 07/12/2023 due to vertigo and migraines, which were associated with her Meniere's disease. The episodes occurred at 3 a.m. on 07/12/2023, she woke up with a spinning sensation that lasted for 3 days. The subsequent episode on 07/22/2023, which started at 7 p.m., lasted for 3 days. The following day, she felt lightheaded and dizzy, preventing her from attending church. She has a history of similar episodes, but none have occurred in years. During these episodes, she is unable to focus on one item and finds it difficult to walk. She has taken Valium twice, which provided some relief and allowed her to  sleep. She still experiences some symptoms upon waking, but they are not as severe.   Since 07/22/2023, she has been feeling much better, albeit fatigued. Today, she still experiences a sensation of her head being asleep, but no spinning. She has experienced tinnitus and slight hearing loss due to her Meniere's disease and bilateral vestibular imbalance, which she believes has worsened her balance issues. She is unable to be in crowds and needs to focus on her needs. Driving is difficult for her due to these episodes. She had a couple of migraines last year, but this vertigo and migraine combination has been some time. She has been taking a diuretic daily for years, which has helped, from [facility] who dx the Menieres. She has limited caffeine and follows a low-sodium diet as per her doctor's advice. [facility] gave her a referral for vestibular physical therapy, but wanted to make sure they did the correct kind. She did not go since she felt fine 2 mos ago at her check up. Last vertigo attack was years ago. She has 2 tablets of Valium left. She was diagnosed with migraines in her 5s.   PAIN:  Are you having pain?  Baseline arthritis pain  PRECAUTIONS: Fall and Other: hx of Meniere's disease  RED FLAGS: None   WEIGHT BEARING RESTRICTIONS: No  FALLS: Has patient fallen in last 6 months? No  LIVING ENVIRONMENT: Lives with: lives alone Lives in: House/apartment Stairs:  1 step into home Has following equipment at home: Single point cane  PLOF: Independent and Leisure: participates in choir activities at church  PATIENT GOALS: To get back to baseline levels of balance/dizziness  OBJECTIVE:    TODAY'S TREATMENT: 08/10/2023 Activity Comments  FGA:  14/30 Scores <22/30 indicate increased fall risk  Seated gaze stabilization horizontal x 30 sec  Vertical  x 30 sec  Few seconds to focus, 2/10 (heaviness in head) 2.5/10  Standing gaze stabilization horizontal x 30 sec Vertical  x 30 sec   Rates 3/10 unsteadiness    Progressed to EC head turns x 30 sec, head nods x 30 sec Light UE support >intermittent, with more unsteadiness upon opening eyes; pt reports some head fullness and beginning of headache  Corner balance feet apart>more narrowed BOS head turns x 5, head nods x 5 Mild sway       [facility] PT Assessment - 08/10/23 1102       Functional Gait  Assessment   Gait assessed  Yes    Gait Level Surface Walks 20 ft, slow speed, abnormal gait pattern, evidence for imbalance or deviates 10-15 in outside of the 12 in walkway width. Requires more than 7 sec to ambulate 20 ft.   10.10 sec  Change in Gait Speed Able to change speed, demonstrates mild gait deviations, deviates 6-10 in outside of the 12 in walkway width, or no gait deviations, unable to achieve a major change in velocity, or uses a change in velocity, or uses an assistive device.    Gait with Horizontal Head Turns Performs head turns with moderate changes in gait velocity, slows down, deviates 10-15 in outside 12 in walkway width but recovers, can continue to walk.   12.34 sec   Gait with Vertical Head Turns Performs task with slight change in gait velocity (eg, minor disruption to smooth gait path), deviates 6 - 10 in outside 12 in walkway width or uses assistive device   10.3 sec   Gait and Pivot Turn Pivot turns safely in greater than 3 sec and stops with no loss of balance, or pivot turns safely within 3 sec and stops with mild imbalance, requires small steps to catch balance.    Step Over Obstacle Is able to step over one shoe box (4.5 in total height) but must slow down and adjust steps to clear box safely. May require verbal cueing.    Gait with Narrow Base of Support Ambulates 4-7 steps.    Gait with Eyes Closed Cannot walk 20 ft without assistance, severe gait deviations or imbalance, deviates greater than 15 in outside 12 in walkway width or will not attempt task.    Ambulating Backwards Walks 20 ft, uses assistive  device, slower speed, mild gait deviations, deviates 6-10 in outside 12 in walkway width.   mid-high guard arms to assist   Steps Alternating feet, must use rail.    Total Score 14    FGA comment: Scores <22/30 indicates increased fall risk             Access Code: [email or web address] URL: [email or web address] Date: 08/10/2023 Prepared by: [facility] - Outpatient  Rehab - [facility]  Exercises - Corner Balance Feet Apart: Eyes Open With Head Turns  - 1-2 x daily - 7 x weekly - 1 sets - 5-10 reps - Seated Gaze Stabilization with Head Rotation  - 1 x daily - 7 x weekly - 3 sets - 30 sec hold - Seated Gaze Stabilization with Head Nod  - 1-2 x daily - 7 x weekly - 3 sets - 30 sec hold  PATIENT EDUCATION: Education details: HEP initiated; FGA results Person educated: Patient Education method: Explanation, Demonstration, Verbal cues, and Handouts Education comprehension: verbalized understanding and returned demonstration  -------------------------------------------------- Objective measures below taken at initial eval:  DIAGNOSTIC FINDINGS: NA for this bout of therapy  COGNITION: Overall cognitive status: Within functional limits for tasks assessed    Cervical ROM:   Grossly tested [facility], but some soreness noted R cervical spin Active A/PROM (deg) eval  Flexion   Extension   Right lateral flexion   Left lateral flexion   Right rotation   Left rotation   (Blank rows = not tested)  TRANSFERS: Assistive device utilized: None  Sit to stand: Modified independence Stand to sit: Modified independence  GAIT: Gait pattern:  guarded gait, reaches for items/furniture to steady, step through pattern, decreased arm swing- Right, decreased arm swing- Left, and wide BOS Distance walked: 30 ft Assistive device utilized: None Level of assistance: Modified independence and SBA Comments: slowed, guarded pattern; pt reports this is normal per her baseline  PATIENT SURVEYS:   FOTO Intake 57 (risk adjusted 54); predicted 57  VESTIBULAR ASSESSMENT:  GENERAL OBSERVATION: Guarded gait pattern, slowed movements  in general   SYMPTOM BEHAVIOR:  Subjective history: Had major vertigo attack in 2012, hx of migraines.  Have history of Meniere's disease and have had therapy in the past (at [facility]).  Have been quite a bit better for >10 years, and manage my symptoms to be steady.  Had a migraine July 16th (with aura) and 29th awoke with vertigo and migraine, lasting 3 days.  Typically taking Valium will help with migraine/vertigo.  Had another 3 day bout with migraine and vertigo 07/22/23.  Saw PCP and sent me to PT.  Non-Vestibular symptoms: changes in hearing, tinnitus, and migraine symptoms  Type of dizziness: Imbalance (Disequilibrium), Unsteady with head/body turns, "Funny feeling in the head", "World moves", and unsteadiness with people moving around   Describes migraines with aura  Frequency: 2 bouts of 3 days in past month  Duration: 3 days  Aggravating factors: Induced by position change: rolling to the left and sit to stand and Worse outside or in busy environment  Relieving factors: head stationary, slow movements, and Valium for migraines as needed  Progression of symptoms:  2 major episodes in past months; have not regained to baseline (mild imbalance-bilat vestibular hypofunction) Baseline symptoms 3/10 at eval Gets pulled to R side with gait (prolonged history)  OCULOMOTOR EXAM: (wears glasses, trifocals); reports sometimes she has to look away from fast movements  Ocular Alignment: normal  Ocular ROM: No Limitations and slowed reports mild increase in dizziness  Spontaneous Nystagmus: absent  Gaze-Induced Nystagmus:  end range nystagmus  Smooth Pursuits: saccades  Saccades:  overall intact, one quick movement return to center  Convergence/Divergence: 14 inches   VESTIBULAR - OCULAR REFLEX:   Slow VOR: Comment: slowed  VOR Cancellation: Corrective  Saccades  Head-Impulse Test: HIT Right: positive HIT Left: positive Mild increase in symptoms; after this testing, rates symptoms as 4/10     M-CTSIB  Condition 1: Firm Surface, EO 30 Sec, Mild Sway  Condition 2: Firm Surface, EC 30 Sec, Mild and Moderate Sway  Condition 3: Foam Surface, EO 30 Sec, Moderate Sway  Condition 4: Foam Surface, EC 17.69 Sec, Severe Sway    MOTION SENSITIVITY:  Motion Sensitivity Quotient Intensity: 0 = none, 1 = Lightheaded, 2 = Mild, 3 = Moderate, 4 = Severe, 5 = Vomiting  Intensity  1. Sitting to supine   2. Supine to L side   3. Supine to R side   4. Supine to sitting   5. L Hallpike-Dix   6. Up from L    7. R Hallpike-Dix   8. Up from R    9. Sitting, head tipped to L knee   10. Head up from L knee   11. Sitting, head tipped to R knee   12. Head up from R knee   13. Sitting head turns x5   14.Sitting head nods x5   15. In stance, 180 turn to L    16. In stance, 180 turn to R        VESTIBULAR TREATMENT:  DATE: 08/03/2023   PATIENT EDUCATION: Education details: PT eval results, POC Person educated: Patient Education method: Explanation Education comprehension: verbalized understanding  HOME EXERCISE PROGRAM:  GOALS: Goals reviewed with patient? Yes  SHORT TERM GOALS: Target date: 09/03/2023  Pt will be independent with HEP for improved balance, dizziness. Baseline: Goal status: IN PROGRESS  2.  FGA to improve 18/30 for decreased fall risk. Baseline: 14/30 Goal status: IN PROGRESS  3.  Pt will complete MCTSIB Condition 4 for 30 seconds moderate sway for improved vestibular system use for balance. Baseline:  Goal status: IN PROGRESS  LONG TERM GOALS: Target date: 09/17/2023  Pt will be independent with HEP for improved balance, dizziness. Baseline:  Goal status: IN PROGRESS  2.  Pt will improve FOTO score to at least 57 to  demonstrate improved overall functional mobility. Baseline: (risk adjusted score 54 at baseline) Goal status: IN PROGRESS  3.  Pt will report return to baseline balance and dizziness (due to pt's hx of Meniere's disease), compared to prior to onset of recent bouts of 3-day vertigo symptoms. Baseline:  Goal status: IN PROGRESS   ASSESSMENT:  CLINICAL IMPRESSION: Skilled PT session today focused on assessing Functional Gait Assessment and initiating HEP to address VOR and vestibular system use for balance.  Pt scored 14/30 on FGA, with generally slowed pace, wider BOS and mid-high guard arms to maintain balance.  PT maintains very close supervision/min guard and overall, pt stops/slows significantly to regain balance.  Initiated HEP for seated/standing VOR and standing corner balance exercises.  She begins to have mild headache with VOR exercises, but the headache subsides with seated rest and with corner balance exercises.  She will benefit from skilled PT towards goals to work on habituation exercises and compensations for decreased balance for overall improved functional mobility and return to prior level of function.  OBJECTIVE IMPAIRMENTS: Abnormal gait, decreased balance, and dizziness.   ACTIVITY LIMITATIONS: sitting, standing, transfers, bed mobility, reach over head, and locomotion level  PARTICIPATION LIMITATIONS: driving, community activity, and church  PERSONAL FACTORS: 3+ comorbidities: see above  are also affecting patient's functional outcome.   REHAB POTENTIAL: Good  CLINICAL DECISION MAKING: Evolving/moderate complexity  EVALUATION COMPLEXITY: Moderate   PLAN:  PT FREQUENCY: 1-2x/week  PT DURATION: 6 weeks plus eval  PLANNED INTERVENTIONS: Therapeutic exercises, Therapeutic activity, Neuromuscular re-education, Balance training, Gait training, Patient/Family education, Self Care, Vestibular training, Canalith repositioning, and Manual therapy  PLAN FOR NEXT  SESSION: Review HEP for vestibular system retraining, balance, VOR and gaze stabilization exercise for habituation.  Has hx of neck pain, so monitor neck with head motions.     Gean Maidens., PT 08/10/2023, 11:47 AM   [facility] at [facility] [address] [place], [address] [place], [place] [address] Phone # [phone] Fax # [phone]

## 2023-08-11 ENCOUNTER — Ambulatory Visit: Payer: PPO | Admitting: Cardiology

## 2023-08-11 ENCOUNTER — Encounter: Payer: Self-pay | Admitting: Cardiology

## 2023-08-11 VITALS — BP 118/78 | HR 72 | Ht 61.5 in | Wt 105.2 lb

## 2023-08-11 DIAGNOSIS — I2584 Coronary atherosclerosis due to calcified coronary lesion: Secondary | ICD-10-CM | POA: Diagnosis not present

## 2023-08-11 DIAGNOSIS — E7800 Pure hypercholesterolemia, unspecified: Secondary | ICD-10-CM

## 2023-08-11 DIAGNOSIS — I2510 Atherosclerotic heart disease of native coronary artery without angina pectoris: Secondary | ICD-10-CM

## 2023-08-11 DIAGNOSIS — I6523 Occlusion and stenosis of bilateral carotid arteries: Secondary | ICD-10-CM

## 2023-08-11 DIAGNOSIS — I340 Nonrheumatic mitral (valve) insufficiency: Secondary | ICD-10-CM

## 2023-08-11 MED ORDER — ROSUVASTATIN CALCIUM 10 MG PO TABS: 10.0000 mg | ORAL_TABLET | ORAL | 3 refills | Status: DC

## 2023-08-11 NOTE — Patient Instructions (Signed)
Medication Instructions:  Please increase your Crestor to 10 mg three times a week. Continue all other medications as listed.  *If you need a refill on your cardiac medications before your next appointment, please call your pharmacy*  Follow-Up: At [facility], you and your health needs are our priority.  As part of our continuing mission to provide you with exceptional heart care, we have created designated Provider Care Teams.  These Care Teams include your primary Cardiologist (physician) and Advanced Practice Providers (APPs -  Physician Assistants and Nurse Practitioners) who all work together to provide you with the care you need, when you need it.  We recommend signing up for the patient portal called "MyChart".  Sign up information is provided on this After Visit Summary.  MyChart is used to connect with patients for Virtual Visits (Telemedicine).  Patients are able to view lab/test results, encounter notes, upcoming appointments, etc.  Non-urgent messages can be sent to your provider as well.   To learn more about what you can do with MyChart, go to [email or web address].    Your next appointment:   1 year(s)  Provider:   Donato Schultz, MD

## 2023-08-11 NOTE — Therapy (Signed)
OUTPATIENT PHYSICAL THERAPY VESTIBULAR TREATMENT NOTE     Patient Name: Lori Frost MRN: [id number] DOB:1946/01/21, 77 y.o., female Today's Date: 08/12/2023  END OF SESSION:  PT End of Session - 08/12/23 1012     Visit Number 3    Number of Visits 13    Date for PT Re-Evaluation 09/17/23    Authorization Type HTA    PT Start Time 0930    PT Stop Time 1011    PT Time Calculation (min) 41 min    Equipment Utilized During Treatment Gait belt   would require gait belt due to imbalance   Activity Tolerance Patient tolerated treatment well    Behavior During Therapy WFL for tasks assessed/performed               Past Medical History:  Diagnosis Date   Allergy    Non-Allergic Rhinitis   Balance problem due to vestibular dysfunction    Bloating    CAD (coronary artery disease)    Carotid artery occlusion    Cataract    Chordae tendineae rupture (HCC)    Cyst of ovary, right    Depression    Diverticulosis    DJD (degenerative joint disease)    Eczema    Family history of adverse reaction to anesthesia    SISTER "HEART STOPPED DURING NWGNFAO"1308 UNKNOWN CAUSE -  SHE DIED 10 YRS LATER OF HEART ATTACK   GERD (gastroesophageal reflux disease)    ESOPHAGITIS PRESENCE NOT SPECIFIED   Granuloma annulare    History of hiatal hernia    History of recurrent UTI (urinary tract infection)    Hypercholesterolemia    Hyperlipidemia    Lower back pain    Meniere's disease    Migraine    Mitral valve regurgitation    MODERATE NORMAL, MILD AORTIC REGURGITATION, MILD TRICUSPID REGURGITATION, LVH, GRADE 1 DIASTOLIC DYSFUNCTION, ECHO 2018 SUGGESTS RUPTURED CHORDAE TENDINEAE ( DR. Donnie Aho)   MVP (mitral valve prolapse)    Myalgia    OA (osteoarthritis) of hip 02/12/2016   Osteoporosis    Other nonrheumatic mitral valve disorders    Psoriasis    Pupil dilated    DUE TO EYE DROPS FOR TX OF RT  EYE PROBLEM   Reactive hypoglycemia    Sensitive skin    Shingles    Vertigo     Vestibular neuronitis    Vitamin D deficiency    Past Surgical History:  Procedure Laterality Date   EYE SURGERY Right 02-05-15   Cataract   EYE SURGERY Left 02-21-15   Cataract   TONSILLECTOMY     TOTAL HIP ARTHROPLASTY Right 02/12/2016   Procedure: RIGHT TOTAL HIP ARTHROPLASTY ANTERIOR APPROACH;  Surgeon: Ollen Gross, MD;  Location: [facility];  Service: Orthopedics;  Laterality: Right;   Patient Active Problem List   Diagnosis Date Noted   Mitral valve regurgitation 08/14/2021   Coronary artery calcification 08/14/2021   Aortic atherosclerosis (HCC) 08/14/2021   Hyperlipidemia 08/14/2021   Carotid artery disease (HCC) 08/14/2021   OA (osteoarthritis) of hip 02/12/2016    PCP: Sigmund Hazel, MD  REFERRING PROVIDER: Sigmund Hazel, MD   REFERRING DIAG: H81.03 (ICD-10-CM) - Meniere's disease, bilateral   THERAPY DIAG:  Dizziness and giddiness  Unsteadiness on feet  Other abnormalities of gait and mobility  ONSET DATE: 07/28/2023 (MD referral)  Rationale for Evaluation and Treatment: Rehabilitation  SUBJECTIVE:   SUBJECTIVE STATEMENT: Requesting clarification on her HEP. HEP is just making her off balance. Reports that she still  has some dizziness day to day but that has been ongoing since 2012. Reports ongoing back pain and reports that she needs to see her orthopedist. Denies B&B changes, N/T.   Pt accompanied by: self  PERTINENT HISTORY: PER MD NOTE:  She consulted Dr. Ladona Horns on 07/12/2023 due to vertigo and migraines, which were associated with her Meniere's disease. The episodes occurred at 3 a.m. on 07/12/2023, she woke up with a spinning sensation that lasted for 3 days. The subsequent episode on 07/22/2023, which started at 7 p.m., lasted for 3 days. The following day, she felt lightheaded and dizzy, preventing her from attending church. She has a history of similar episodes, but none have occurred in years. During these episodes, she is unable to focus on one item and  finds it difficult to walk. She has taken Valium twice, which provided some relief and allowed her to sleep. She still experiences some symptoms upon waking, but they are not as severe.   Since 07/22/2023, she has been feeling much better, albeit fatigued. Today, she still experiences a sensation of her head being asleep, but no spinning. She has experienced tinnitus and slight hearing loss due to her Meniere's disease and bilateral vestibular imbalance, which she believes has worsened her balance issues. She is unable to be in crowds and needs to focus on her needs. Driving is difficult for her due to these episodes. She had a couple of migraines last year, but this vertigo and migraine combination has been some time. She has been taking a diuretic daily for years, which has helped, from [facility] who dx the Menieres. She has limited caffeine and follows a low-sodium diet as per her doctor's advice. [facility] gave her a referral for vestibular physical therapy, but wanted to make sure they did the correct kind. She did not go since she felt fine 2 mos ago at her check up. Last vertigo attack was years ago. She has 2 tablets of Valium left. She was diagnosed with migraines in her 32s.   PAIN:  Are you having pain? Yes: NPRS scale: 5/10 Pain location: R LB Pain description: throbbing Aggravating factors: step wrong, move suddenly  Relieving factors: Tylenol, lidocaine patch   PRECAUTIONS: Fall and Other: hx of Meniere's disease  RED FLAGS: None   WEIGHT BEARING RESTRICTIONS: No  FALLS: Has patient fallen in last 6 months? No  LIVING ENVIRONMENT: Lives with: lives alone Lives in: House/apartment Stairs:  1 step into home Has following equipment at home: Single point cane  PLOF: Independent and Leisure: participates in choir activities at church  PATIENT GOALS: To get back to baseline levels of balance/dizziness  OBJECTIVE:     TODAY'S TREATMENT: 08/12/23 Activity Comments  standing VOR  horizontal 2x30" C/o 1-2/10 dizziness and off balance   standing VOR vertical 2x30" C/o 0.5-1/10 dizziness and off balance ; cues for quicker pace   fwd/back stepping 10x each side, then with + head nods  10x each 1 UE support, mild-mod imbalance and CGA  1/2 turns to targets  Moderate imbalance requiring min A, better safety when performing in doorway for support. No dizziness   Standing head turns to targets 2x30" Mild sway      HOME EXERCISE PROGRAM Last updated: 08/12/23 Access Code: [email or web address] URL: [email or web address] Date: 08/12/2023 Prepared by: [facility] - Outpatient  Rehab - [facility]  Exercises - Corner Balance Feet Apart: Eyes Open With Head Turns  - 1-2 x daily - 7 x weekly - 1 sets -  5-10 reps - Seated Gaze Stabilization with Head Rotation  - 1 x daily - 7 x weekly - 3 sets - 30 sec hold - Seated Gaze Stabilization with Head Nod  - 1-2 x daily - 7 x weekly - 3 sets - 30 sec hold - Alternating Step Forward with Support  - 1 x daily - 5 x weekly - 2 sets - 10 reps    PATIENT EDUCATION: Education details: review and update to HEP, edu on benefits of exercise on vestibular health  Person educated: Patient Education method: Explanation, Demonstration, Tactile cues, Verbal cues, and Handouts Education comprehension: verbalized understanding and returned demonstration    -------------------------------------------------- Objective measures below taken at initial eval:  DIAGNOSTIC FINDINGS: NA for this bout of therapy  COGNITION: Overall cognitive status: Within functional limits for tasks assessed    Cervical ROM:   Grossly tested [facility], but some soreness noted R cervical spin Active A/PROM (deg) eval  Flexion   Extension   Right lateral flexion   Left lateral flexion   Right rotation   Left rotation   (Blank rows = not tested)  TRANSFERS: Assistive device utilized: None  Sit to stand: Modified independence Stand to sit: Modified  independence  GAIT: Gait pattern:  guarded gait, reaches for items/furniture to steady, step through pattern, decreased arm swing- Right, decreased arm swing- Left, and wide BOS Distance walked: 30 ft Assistive device utilized: None Level of assistance: Modified independence and SBA Comments: slowed, guarded pattern; pt reports this is normal per her baseline  PATIENT SURVEYS:  FOTO Intake 57 (risk adjusted 54); predicted 57  VESTIBULAR ASSESSMENT:  GENERAL OBSERVATION: Guarded gait pattern, slowed movements in general   SYMPTOM BEHAVIOR:  Subjective history: Had major vertigo attack in 2012, hx of migraines.  Have history of Meniere's disease and have had therapy in the past (at [facility]).  Have been quite a bit better for >10 years, and manage my symptoms to be steady.  Had a migraine July 16th (with aura) and 29th awoke with vertigo and migraine, lasting 3 days.  Typically taking Valium will help with migraine/vertigo.  Had another 3 day bout with migraine and vertigo 07/22/23.  Saw PCP and sent me to PT.  Non-Vestibular symptoms: changes in hearing, tinnitus, and migraine symptoms  Type of dizziness: Imbalance (Disequilibrium), Unsteady with head/body turns, "Funny feeling in the head", "World moves", and unsteadiness with people moving around   Describes migraines with aura  Frequency: 2 bouts of 3 days in past month  Duration: 3 days  Aggravating factors: Induced by position change: rolling to the left and sit to stand and Worse outside or in busy environment  Relieving factors: head stationary, slow movements, and Valium for migraines as needed  Progression of symptoms:  2 major episodes in past months; have not regained to baseline (mild imbalance-bilat vestibular hypofunction) Baseline symptoms 3/10 at eval Gets pulled to R side with gait (prolonged history)  OCULOMOTOR EXAM: (wears glasses, trifocals); reports sometimes she has to look away from fast movements  Ocular Alignment:  normal  Ocular ROM: No Limitations and slowed reports mild increase in dizziness  Spontaneous Nystagmus: absent  Gaze-Induced Nystagmus:  end range nystagmus  Smooth Pursuits: saccades  Saccades:  overall intact, one quick movement return to center  Convergence/Divergence: 14 inches   VESTIBULAR - OCULAR REFLEX:   Slow VOR: Comment: slowed  VOR Cancellation: Corrective Saccades  Head-Impulse Test: HIT Right: positive HIT Left: positive Mild increase in symptoms; after this testing, rates  symptoms as 4/10     M-CTSIB  Condition 1: Firm Surface, EO 30 Sec, Mild Sway  Condition 2: Firm Surface, EC 30 Sec, Mild and Moderate Sway  Condition 3: Foam Surface, EO 30 Sec, Moderate Sway  Condition 4: Foam Surface, EC 17.69 Sec, Severe Sway    MOTION SENSITIVITY:  Motion Sensitivity Quotient Intensity: 0 = none, 1 = Lightheaded, 2 = Mild, 3 = Moderate, 4 = Severe, 5 = Vomiting  Intensity  1. Sitting to supine   2. Supine to L side   3. Supine to R side   4. Supine to sitting   5. L Hallpike-Dix   6. Up from L    7. R Hallpike-Dix   8. Up from R    9. Sitting, head tipped to L knee   10. Head up from L knee   11. Sitting, head tipped to R knee   12. Head up from R knee   13. Sitting head turns x5   14.Sitting head nods x5   15. In stance, 180 turn to L    16. In stance, 180 turn to R        VESTIBULAR TREATMENT:                                                                                                   DATE: 08/03/2023   PATIENT EDUCATION: Education details: PT eval results, POC Person educated: Patient Education method: Explanation Education comprehension: verbalized understanding  HOME EXERCISE PROGRAM:  GOALS: Goals reviewed with patient? Yes  SHORT TERM GOALS: Target date: 09/03/2023  Pt will be independent with HEP for improved balance, dizziness. Baseline: Goal status: IN PROGRESS  2.  FGA to improve 18/30 for decreased fall risk. Baseline:  14/30 Goal status: IN PROGRESS  3.  Pt will complete MCTSIB Condition 4 for 30 seconds moderate sway for improved vestibular system use for balance. Baseline:  Goal status: IN PROGRESS  LONG TERM GOALS: Target date: 09/17/2023  Pt will be independent with HEP for improved balance, dizziness. Baseline:  Goal status: IN PROGRESS  2.  Pt will improve FOTO score to at least 57 to demonstrate improved overall functional mobility. Baseline: (risk adjusted score 54 at baseline) Goal status: IN PROGRESS  3.  Pt will report return to baseline balance and dizziness (due to pt's hx of Meniere's disease), compared to prior to onset of recent bouts of 3-day vertigo symptoms. Baseline:  Goal status: IN PROGRESS   ASSESSMENT:  CLINICAL IMPRESSION: Patient arrived to session with request to clarify HEP- patient reported understanding after review. Gaze stabilization exercises brought on mild dizziness and patient received edu on intended level of sx with activities. Incorporated body turns and head movements with stepping strategy and main symptom was imbalance rather than dizziness. Updated HEP with balance activities that were safely performed today- patient reported understanding and without complaints upon leaving.   OBJECTIVE IMPAIRMENTS: Abnormal gait, decreased balance, and dizziness.   ACTIVITY LIMITATIONS: sitting, standing, transfers, bed mobility, reach over head, and locomotion level  PARTICIPATION LIMITATIONS: driving, community activity, and church  PERSONAL FACTORS: 3+ comorbidities: see above  are also affecting patient's functional outcome.   REHAB POTENTIAL: Good  CLINICAL DECISION MAKING: Evolving/moderate complexity  EVALUATION COMPLEXITY: Moderate   PLAN:  PT FREQUENCY: 1-2x/week  PT DURATION: 6 weeks plus eval  PLANNED INTERVENTIONS: Therapeutic exercises, Therapeutic activity, Neuromuscular re-education, Balance training, Gait training, Patient/Family education,  Self Care, Vestibular training, Canalith repositioning, and Manual therapy  PLAN FOR NEXT SESSION: talk about walking program? Review HEP for vestibular system retraining, balance, VOR and gaze stabilization exercise for habituation.  Has hx of neck pain, so monitor neck with head motions.     Anette Guarneri, PT, DPT 08/12/23 10:13 AM  [facility] at [facility] [address] [place], [address] [place], [place] [address] Phone # [phone] Fax # [phone]

## 2023-08-11 NOTE — Progress Notes (Signed)
Cardiology Office Note:  .   Date:  08/11/2023  ID:  Lori Frost, DOB August 08, 1946, MRN [id number] PCP: Lori Hazel, MD  [facility] Providers Cardiologist:  Donato Schultz, MD    History of Present Illness: .   Lori Frost is a 77 y.o. female Discussed the use of AI scribe software for clinical note transcription with the patient, who gave verbal consent to proceed.  History of Present Illness   The patient, with a history of moderate to severe asymptomatic mitral valve regurgitation and high cholesterol, presents for a yearly follow-up. She reports recent episodes of vertigo and migraines, which lasted for three days and have affected her balance. These episodes have led to her undergoing physical therapy. She has been managing well for years until these recent episodes. She also reports occasional palpitations, described as a 'little skip,' but denies any fainting episodes, racing heart, chest pain, or shortness of breath. The patient's cholesterol is being managed with Zetia and Crestor, taken three times a week. She reports joint pain with daily use of Crestor, hence the reduced frequency.        Studies Reviewed: Marland Kitchen   EKG Interpretation Date/Time:  Wednesday August 11 2023 10:14:55 EDT Ventricular Rate:  70 PR Interval:  124 QRS Duration:  84 QT Interval:  404 QTC Calculation: 436 R Axis:   -21  Text Interpretation: Normal sinus rhythm Normal ECG No previous ECGs available Confirmed by Donato Schultz ([id number]) on 08/11/2023 10:18:45 AM    LABS LDL cholesterol: 97 (07/16/2023) Creatinine: 0.99 Hemoglobin: 14.3 TSH: Normal  DIAGNOSTIC Echocardiogram: Stable, moderate to severe mitral regurgitation, normal ejection fraction (60-65%), mildly dilated ascending aorta (40 mm) (08/09/2023) Coronary calcium score: 19, 46th percentile, mild calcified plaque (09/12/2019) Carotid ultrasound: Very mild bilateral carotid plaque (09/04/2020) Risk Assessment/Calculations:             Physical Exam:   VS:  BP 118/78   Pulse 72   Ht 5' 1.5" (1.562 m)   Wt 105 lb 3.2 oz (47.7 kg)   SpO2 96%   BMI 19.56 kg/m    Wt Readings from Last 3 Encounters:  08/11/23 105 lb 3.2 oz (47.7 kg)  08/11/22 109 lb (49.4 kg)  08/14/21 112 lb (50.8 kg)    GEN: Well nourished, well developed in no acute distress NECK: No JVD; No carotid bruits CARDIAC: RRR, 3/6 systolic musical murmur, no rubs, gallops RESPIRATORY:  Clear to auscultation without rales, wheezing or rhonchi  ABDOMEN: Soft, non-tender, non-distended EXTREMITIES:  No edema; No deformity   ASSESSMENT AND PLAN: .    Assessment and Plan    Mitral Valve Regurgitation Stable moderate to severe mitral regurgitation with normal ejection fraction (60-65%) and no symptoms of shortness of breath or chest pain. Mildly dilated ascending aorta at 40mm. -Continue current management and monitor for any changes in symptoms. -Discussed criteria for surgery referral.   Hyperlipidemia LDL cholesterol at 97, slightly above the desired level of <70. Patient currently on Zetia 10mg  daily and Crestor 5mg  three times a week, but experiences joint pain with higher doses of Crestor. -Increase Crestor to 10mg  three times a week and monitor for tolerance. Willing to try.  -Recheck cholesterol levels at next annual visit with PCP.  Let us know if intolerance to increased Crestor dose.  Meniere's Disease Recent episodes of vertigo and migraines leading to impaired balance. Currently undergoing physical therapy. -Continue physical therapy and monitor for improvement in balance.  Carotid Artery Disease Mild bilateral carotid  plaque noted on last carotid ultrasound (09-04-2020). -Continue current management and monitor for any new symptoms.            Dispo: 1 yr  Signed, Donato Schultz, MD

## 2023-08-12 ENCOUNTER — Ambulatory Visit: Payer: PPO | Admitting: Physical Therapy

## 2023-08-12 ENCOUNTER — Encounter: Payer: Self-pay | Admitting: Physical Therapy

## 2023-08-12 DIAGNOSIS — R2681 Unsteadiness on feet: Secondary | ICD-10-CM

## 2023-08-12 DIAGNOSIS — R2689 Other abnormalities of gait and mobility: Secondary | ICD-10-CM

## 2023-08-12 DIAGNOSIS — R42 Dizziness and giddiness: Secondary | ICD-10-CM

## 2023-08-17 ENCOUNTER — Encounter: Payer: Self-pay | Admitting: Rehabilitative and Restorative Service Providers"

## 2023-08-17 ENCOUNTER — Ambulatory Visit: Payer: PPO | Attending: Family Medicine | Admitting: Rehabilitative and Restorative Service Providers"

## 2023-08-17 DIAGNOSIS — R2689 Other abnormalities of gait and mobility: Secondary | ICD-10-CM | POA: Diagnosis not present

## 2023-08-17 DIAGNOSIS — R42 Dizziness and giddiness: Secondary | ICD-10-CM | POA: Insufficient documentation

## 2023-08-17 DIAGNOSIS — R2681 Unsteadiness on feet: Secondary | ICD-10-CM | POA: Diagnosis not present

## 2023-08-17 NOTE — Therapy (Signed)
OUTPATIENT PHYSICAL THERAPY VESTIBULAR TREATMENT NOTE   Patient Name: Lori Frost MRN: [id number] DOB:02/10/1946, 77 y.o., female Today's Date: 08/17/2023  END OF SESSION:  PT End of Session - 08/17/23 0931     Visit Number 4    Number of Visits 13    Date for PT Re-Evaluation 09/17/23    Authorization Type HTA    Progress Note Due on Visit 10    PT Start Time 0932    PT Stop Time 1014    PT Time Calculation (min) 42 min    Equipment Utilized During Treatment Gait belt   would require gait belt due to imbalance   Activity Tolerance Patient tolerated treatment well    Behavior During Therapy WFL for tasks assessed/performed            Past Medical History:  Diagnosis Date   Allergy    Non-Allergic Rhinitis   Balance problem due to vestibular dysfunction    Bloating    CAD (coronary artery disease)    Carotid artery occlusion    Cataract    Chordae tendineae rupture (HCC)    Cyst of ovary, right    Depression    Diverticulosis    DJD (degenerative joint disease)    Eczema    Family history of adverse reaction to anesthesia    SISTER "HEART STOPPED DURING DGLOVFI"4332 UNKNOWN CAUSE -  SHE DIED 10 YRS LATER OF HEART ATTACK   GERD (gastroesophageal reflux disease)    ESOPHAGITIS PRESENCE NOT SPECIFIED   Granuloma annulare    History of hiatal hernia    History of recurrent UTI (urinary tract infection)    Hypercholesterolemia    Hyperlipidemia    Lower back pain    Meniere's disease    Migraine    Mitral valve regurgitation    MODERATE NORMAL, MILD AORTIC REGURGITATION, MILD TRICUSPID REGURGITATION, LVH, GRADE 1 DIASTOLIC DYSFUNCTION, ECHO 2018 SUGGESTS RUPTURED CHORDAE TENDINEAE ( DR. Donnie Aho)   MVP (mitral valve prolapse)    Myalgia    OA (osteoarthritis) of hip 02/12/2016   Osteoporosis    Other nonrheumatic mitral valve disorders    Psoriasis    Pupil dilated    DUE TO EYE DROPS FOR TX OF RT  EYE PROBLEM   Reactive hypoglycemia    Sensitive skin     Shingles    Vertigo    Vestibular neuronitis    Vitamin D deficiency    Past Surgical History:  Procedure Laterality Date   EYE SURGERY Right 02-05-15   Cataract   EYE SURGERY Left 02-21-15   Cataract   TONSILLECTOMY     TOTAL HIP ARTHROPLASTY Right 02/12/2016   Procedure: RIGHT TOTAL HIP ARTHROPLASTY ANTERIOR APPROACH;  Surgeon: Ollen Gross, MD;  Location: [facility];  Service: Orthopedics;  Laterality: Right;   Patient Active Problem List   Diagnosis Date Noted   Mitral valve regurgitation 08/14/2021   Coronary artery calcification 08/14/2021   Aortic atherosclerosis (HCC) 08/14/2021   Hyperlipidemia 08/14/2021   Carotid artery disease (HCC) 08/14/2021   OA (osteoarthritis) of hip 02/12/2016    PCP: Sigmund Hazel, MD  REFERRING PROVIDER: Sigmund Hazel, MD  REFERRING DIAG: H81.03 (ICD-10-CM) - Meniere's disease, bilateral  THERAPY DIAG:  Dizziness and giddiness  Unsteadiness on feet  Other abnormalities of gait and mobility  ONSET DATE: 07/28/2023 (MD referral)  Rationale for Evaluation and Treatment: Rehabilitation  SUBJECTIVE:   SUBJECTIVE STATEMENT: The patient noticed that overhead fans + television viewing provokes a sensation of dizziness.  She continues with low back "if I step it is 5 or more".  Pt accompanied by: self  PERTINENT HISTORY: PER MD NOTE:  She consulted Dr. Ladona Horns on 07/12/2023 due to vertigo and migraines, which were associated with her Meniere's disease. The episodes occurred at 3 a.m. on 07/12/2023, she woke up with a spinning sensation that lasted for 3 days. The subsequent episode on 07/22/2023, which started at 7 p.m., lasted for 3 days. The following day, she felt lightheaded and dizzy, preventing her from attending church. She has a history of similar episodes, but none have occurred in years. During these episodes, she is unable to focus on one item and finds it difficult to walk. She has taken Valium twice, which provided some relief and allowed her  to sleep. She still experiences some symptoms upon waking, but they are not as severe.   Since 07/22/2023, she has been feeling much better, albeit fatigued. Today, she still experiences a sensation of her head being asleep, but no spinning. She has experienced tinnitus and slight hearing loss due to her Meniere's disease and bilateral vestibular imbalance, which she believes has worsened her balance issues. She is unable to be in crowds and needs to focus on her needs. Driving is difficult for her due to these episodes. She had a couple of migraines last year, but this vertigo and migraine combination has been some time. She has been taking a diuretic daily for years, which has helped, from [facility] who dx the Menieres. She has limited caffeine and follows a low-sodium diet as per her doctor's advice. [facility] gave her a referral for vestibular physical therapy, but wanted to make sure they did the correct kind. She did not go since she felt fine 2 mos ago at her check up. Last vertigo attack was years ago. She has 2 tablets of Valium left. She was diagnosed with migraines in her 44s.   PAIN:  Are you having pain? Yes: NPRS scale: 2/10 at rest, and 5/10 with walking /10 Pain location: R LB Pain description: throbbing Aggravating factors: step wrong, move suddenly  Relieving factors: Tylenol, lidocaine patch   PRECAUTIONS: Fall and Other: hx of Meniere's disease  WEIGHT BEARING RESTRICTIONS: No  FALLS: Has patient fallen in last 6 months? No  LIVING ENVIRONMENT: Lives with: lives alone Lives in: House/apartment Stairs:  1 step into home Has following equipment at home: Single point cane  PLOF: Independent and Leisure: participates in choir activities at church  PATIENT GOALS: To get back to baseline levels of balance/dizziness  OBJECTIVE:  [facility] Adult PT Treatment:                                                DATE: 08/17/23 Neuromuscular re-ed: Gaze adaptation  x 1 viewing horizontal  and vertical planes x 30 seconds x 3 reps each Standing balance Up/up, down/down to 6" step x 8 reps dec'ing from  Alternating foot tapping dec'ing UE support Rocker board with horizontal head turns and with eyes closed and min A, dec'ing UE support Habituation Standing head turns, 360 degree turns, 180 degree turns and spotting between targets Positional-- checked positional testing of bilat sidelying tests and bilat horizontal roll testing -- all negative Gait: Encouraged visual fixation when she is unsteady to reduce the compensatory strategies she uses Discussed home walking program and finding  safe activities to increase activities  TODAY'S TREATMENT: 08/12/23 Activity Comments  standing VOR horizontal 2x30" C/o 1-2/10 dizziness and off balance   standing VOR vertical 2x30" C/o 0.5-1/10 dizziness and off balance ; cues for quicker pace   fwd/back stepping 10x each side, then with + head nods  10x each 1 UE support, mild-mod imbalance and CGA  1/2 turns to targets  Moderate imbalance requiring min A, better safety when performing in doorway for support. No dizziness   Standing head turns to targets 2x30" Mild sway    HOME EXERCISE PROGRAM Last updated: 08/12/23 Access Code: [email or web address] URL: [email or web address] Date: 08/12/2023 Prepared by: [facility] - Outpatient  Rehab - [facility]  Exercises - Corner Balance Feet Apart: Eyes Open With Head Turns  - 1-2 x daily - 7 x weekly - 1 sets - 5-10 reps - Seated Gaze Stabilization with Head Rotation  - 1 x daily - 7 x weekly - 3 sets - 30 sec hold - Seated Gaze Stabilization with Head Nod  - 1-2 x daily - 7 x weekly - 3 sets - 30 sec hold - Alternating Step Forward with Support  - 1 x daily - 5 x weekly - 2 sets - 10 reps  PATIENT EDUCATION: Education details: review and update to HEP, edu on benefits of exercise on vestibular health  Person educated: Patient Education method: Explanation, Demonstration, Tactile cues,  Verbal cues, and Handouts Education comprehension: verbalized understanding and returned demonstration  -------------------------------------------------- Objective measures below taken at initial eval:  DIAGNOSTIC FINDINGS: NA for this bout of therapy  COGNITION: Overall cognitive status: Within functional limits for tasks assessed  Cervical ROM:   Grossly tested [facility], but some soreness noted R cervical spin Active A/PROM (deg) eval  Flexion   Extension   Right lateral flexion   Left lateral flexion   Right rotation   Left rotation   (Blank rows = not tested)  TRANSFERS: Assistive device utilized: None  Sit to stand: Modified independence Stand to sit: Modified independence  GAIT: Gait pattern:  guarded gait, reaches for items/furniture to steady, step through pattern, decreased arm swing- Right, decreased arm swing- Left, and wide BOS Distance walked: 30 ft Assistive device utilized: None Level of assistance: Modified independence and SBA Comments: slowed, guarded pattern; pt reports this is normal per her baseline  PATIENT SURVEYS:  FOTO Intake 57 (risk adjusted 54); predicted 57  VESTIBULAR ASSESSMENT: GENERAL OBSERVATION: Guarded gait pattern, slowed movements in general   SYMPTOM BEHAVIOR:  Subjective history: Had major vertigo attack in 2012, hx of migraines.  Have history of Meniere's disease and have had therapy in the past (at [facility]).  Have been quite a bit better for >10 years, and manage my symptoms to be steady.  Had a migraine July 16th (with aura) and 29th awoke with vertigo and migraine, lasting 3 days.  Typically taking Valium will help with migraine/vertigo.  Had another 3 day bout with migraine and vertigo 07/22/23.  Saw PCP and sent me to PT.  Non-Vestibular symptoms: changes in hearing, tinnitus, and migraine symptoms  Type of dizziness: Imbalance (Disequilibrium), Unsteady with head/body turns, "Funny feeling in the head", "World moves", and  unsteadiness with people moving around   Describes migraines with aura  Frequency: 2 bouts of 3 days in past month  Duration: 3 days  Aggravating factors: Induced by position change: rolling to the left and sit to stand and Worse outside or in busy environment  Relieving factors: head stationary, slow  movements, and Valium for migraines as needed  Progression of symptoms:  2 major episodes in past months; have not regained to baseline (mild imbalance-bilat vestibular hypofunction) Baseline symptoms 3/10 at eval Gets pulled to R side with gait (prolonged history)  OCULOMOTOR EXAM: (wears glasses, trifocals); reports sometimes she has to look away from fast movements  Ocular Alignment: normal  Ocular ROM: No Limitations and slowed reports mild increase in dizziness  Spontaneous Nystagmus: absent  Gaze-Induced Nystagmus:  end range nystagmus  Smooth Pursuits: saccades  Saccades:  overall intact, one quick movement return to center  Convergence/Divergence: 14 inches  VESTIBULAR - OCULAR REFLEX:   Slow VOR: Comment: slowed  VOR Cancellation: Corrective Saccades  Head-Impulse Test: HIT Right: positive HIT Left: positive Mild increase in symptoms; after this testing, rates symptoms as 4/10   M-CTSIB  Condition 1: Firm Surface, EO 30 Sec, Mild Sway  Condition 2: Firm Surface, EC 30 Sec, Mild and Moderate Sway  Condition 3: Foam Surface, EO 30 Sec, Moderate Sway  Condition 4: Foam Surface, EC 17.69 Sec, Severe Sway    MOTION SENSITIVITY:  Motion Sensitivity Quotient Intensity: 0 = none, 1 = Lightheaded, 2 = Mild, 3 = Moderate, 4 = Severe, 5 = Vomiting  Intensity  1. Sitting to supine   2. Supine to L side   3. Supine to R side   4. Supine to sitting   5. L Hallpike-Dix   6. Up from L    7. R Hallpike-Dix   8. Up from R    9. Sitting, head tipped to L knee   10. Head up from L knee   11. Sitting, head tipped to R knee   12. Head up from R knee   13. Sitting head turns x5    14.Sitting head nods x5   15. In stance, 180 turn to L    16. In stance, 180 turn to R     GOALS: Goals reviewed with patient? Yes  SHORT TERM GOALS: Target date: 09/03/2023  Pt will be independent with HEP for improved balance, dizziness. Baseline: Goal status: IN PROGRESS  2.  FGA to improve 18/30 for decreased fall risk. Baseline: 14/30 Goal status: IN PROGRESS  3.  Pt will complete MCTSIB Condition 4 for 30 seconds moderate sway for improved vestibular system use for balance. Baseline:  Goal status: IN PROGRESS  LONG TERM GOALS: Target date: 09/17/2023  Pt will be independent with HEP for improved balance, dizziness. Baseline:  Goal status: IN PROGRESS  2.  Pt will improve FOTO score to at least 57 to demonstrate improved overall functional mobility. Baseline: (risk adjusted score 54 at baseline) Goal status: IN PROGRESS  3.  Pt will report return to baseline balance and dizziness (due to pt's hx of Meniere's disease), compared to prior to onset of recent bouts of 3-day vertigo symptoms. Baseline:  Goal status: IN PROGRESS  ASSESSMENT:  CLINICAL IMPRESSION: The patient reports imbalance more prevalent than true dizziness. She tends to overcompensate for balance correction making imbalance more prevalent. PT began working on limits of stability (weight shift and sway on rocker board) to work on sway and reducing excessive reactive balance strategies. Plan to progress gaze to standing for HEP next session.  OBJECTIVE IMPAIRMENTS: Abnormal gait, decreased balance, and dizziness.   PLAN:  PT FREQUENCY: 1-2x/week  PT DURATION: 6 weeks plus eval  PLANNED INTERVENTIONS: Therapeutic exercises, Therapeutic activity, Neuromuscular re-education, Balance training, Gait training, Patient/Family education, Self Care, Vestibular training, Canalith repositioning,  and Manual therapy  PLAN FOR NEXT SESSION: progress VOR to standing, Review HEP for vestibular system retraining,  balance, VOR and gaze stabilization exercise for habituation.  Has hx of neck pain, so monitor neck with head motions.     Margretta Ditty, PT 08/17/2023 [facility] at [facility] [address] [place], [address] [place], [place] [address] Phone # [phone] Fax # [phone]

## 2023-08-19 ENCOUNTER — Ambulatory Visit: Payer: PPO | Admitting: Physical Therapy

## 2023-08-23 NOTE — Therapy (Signed)
OUTPATIENT PHYSICAL THERAPY VESTIBULAR TREATMENT NOTE   Patient Name: Lori Frost MRN: [id number] DOB:September 23, 1946, 77 y.o., female Today's Date: 08/23/2023  END OF SESSION:   Past Medical History:  Diagnosis Date   Allergy    Non-Allergic Rhinitis   Balance problem due to vestibular dysfunction    Bloating    CAD (coronary artery disease)    Carotid artery occlusion    Cataract    Chordae tendineae rupture (HCC)    Cyst of ovary, right    Depression    Diverticulosis    DJD (degenerative joint disease)    Eczema    Family history of adverse reaction to anesthesia    SISTER "HEART STOPPED DURING [id number] UNKNOWN CAUSE -  SHE DIED 10 YRS LATER OF HEART ATTACK   GERD (gastroesophageal reflux disease)    ESOPHAGITIS PRESENCE NOT SPECIFIED   Granuloma annulare    History of hiatal hernia    History of recurrent UTI (urinary tract infection)    Hypercholesterolemia    Hyperlipidemia    Lower back pain    Meniere's disease    Migraine    Mitral valve regurgitation    MODERATE NORMAL, MILD AORTIC REGURGITATION, MILD TRICUSPID REGURGITATION, LVH, GRADE 1 DIASTOLIC DYSFUNCTION, ECHO 2018 SUGGESTS RUPTURED CHORDAE TENDINEAE ( DR. Donnie Aho)   MVP (mitral valve prolapse)    Myalgia    OA (osteoarthritis) of hip 02/12/2016   Osteoporosis    Other nonrheumatic mitral valve disorders    Psoriasis    Pupil dilated    DUE TO EYE DROPS FOR TX OF RT  EYE PROBLEM   Reactive hypoglycemia    Sensitive skin    Shingles    Vertigo    Vestibular neuronitis    Vitamin D deficiency    Past Surgical History:  Procedure Laterality Date   EYE SURGERY Right 02-05-15   Cataract   EYE SURGERY Left 02-21-15   Cataract   TONSILLECTOMY     TOTAL HIP ARTHROPLASTY Right 02/12/2016   Procedure: RIGHT TOTAL HIP ARTHROPLASTY ANTERIOR APPROACH;  Surgeon: Ollen Gross, MD;  Location: [facility];  Service: Orthopedics;  Laterality: Right;   Patient Active Problem List   Diagnosis Date Noted   Mitral  valve regurgitation 08/14/2021   Coronary artery calcification 08/14/2021   Aortic atherosclerosis (HCC) 08/14/2021   Hyperlipidemia 08/14/2021   Carotid artery disease (HCC) 08/14/2021   OA (osteoarthritis) of hip 02/12/2016    PCP: Sigmund Hazel, MD  REFERRING PROVIDER: Sigmund Hazel, MD  REFERRING DIAG: H81.03 (ICD-10-CM) - Meniere's disease, bilateral  THERAPY DIAG:  No diagnosis found.  ONSET DATE: 07/28/2023 (MD referral)  Rationale for Evaluation and Treatment: Rehabilitation  SUBJECTIVE:   SUBJECTIVE STATEMENT: ***The patient noticed that overhead fans + television viewing provokes a sensation of dizziness. She continues with low back "if I step it is 5 or more".  Pt accompanied by: self  PERTINENT HISTORY: PER MD NOTE:  She consulted Dr. Ladona Horns on 07/12/2023 due to vertigo and migraines, which were associated with her Meniere's disease. The episodes occurred at 3 a.m. on 07/12/2023, she woke up with a spinning sensation that lasted for 3 days. The subsequent episode on 07/22/2023, which started at 7 p.m., lasted for 3 days. The following day, she felt lightheaded and dizzy, preventing her from attending church. She has a history of similar episodes, but none have occurred in years. During these episodes, she is unable to focus on one item and finds it difficult to walk. She has taken Valium  twice, which provided some relief and allowed her to sleep. She still experiences some symptoms upon waking, but they are not as severe.   Since 07/22/2023, she has been feeling much better, albeit fatigued. Today, she still experiences a sensation of her head being asleep, but no spinning. She has experienced tinnitus and slight hearing loss due to her Meniere's disease and bilateral vestibular imbalance, which she believes has worsened her balance issues. She is unable to be in crowds and needs to focus on her needs. Driving is difficult for her due to these episodes. She had a couple of  migraines last year, but this vertigo and migraine combination has been some time. She has been taking a diuretic daily for years, which has helped, from [facility] who dx the Menieres. She has limited caffeine and follows a low-sodium diet as per her doctor's advice. [facility] gave her a referral for vestibular physical therapy, but wanted to make sure they did the correct kind. She did not go since she felt fine 2 mos ago at her check up. Last vertigo attack was years ago. She has 2 tablets of Valium left. She was diagnosed with migraines in her 81s.   PAIN:  Are you having pain? Yes: NPRS scale: 2/10 at rest, and 5/10 with walking /10 Pain location: R LB Pain description: throbbing Aggravating factors: step wrong, move suddenly  Relieving factors: Tylenol, lidocaine patch   PRECAUTIONS: Fall and Other: hx of Meniere's disease  WEIGHT BEARING RESTRICTIONS: No  FALLS: Has patient fallen in last 6 months? No  LIVING ENVIRONMENT: Lives with: lives alone Lives in: House/apartment Stairs:  1 step into home Has following equipment at home: Single point cane  PLOF: Independent and Leisure: participates in choir activities at church  PATIENT GOALS: To get back to baseline levels of balance/dizziness  OBJECTIVE:    TODAY'S TREATMENT: 08/24/2023 Activity Comments                      HOME EXERCISE PROGRAM Last updated: 08/12/23 Access Code: [email or web address] URL: [email or web address] Date: 08/12/2023 Prepared by: [facility] [place] - Outpatient  Rehab - [facility]  Exercises - Corner Balance Feet Apart: Eyes Open With Head Turns  - 1-2 x daily - 7 x weekly - 1 sets - 5-10 reps - Seated Gaze Stabilization with Head Rotation  - 1 x daily - 7 x weekly - 3 sets - 30 sec hold - Seated Gaze Stabilization with Head Nod  - 1-2 x daily - 7 x weekly - 3 sets - 30 sec hold - Alternating Step Forward with Support  - 1 x daily - 5 x weekly - 2 sets - 10  reps    -------------------------------------------------- Objective measures below taken at initial eval:  DIAGNOSTIC FINDINGS: NA for this bout of therapy  COGNITION: Overall cognitive status: Within functional limits for tasks assessed  Cervical ROM:   Grossly tested [facility], but some soreness noted R cervical spin Active A/PROM (deg) eval  Flexion   Extension   Right lateral flexion   Left lateral flexion   Right rotation   Left rotation   (Blank rows = not tested)  TRANSFERS: Assistive device utilized: None  Sit to stand: Modified independence Stand to sit: Modified independence  GAIT: Gait pattern:  guarded gait, reaches for items/furniture to steady, step through pattern, decreased arm swing- Right, decreased arm swing- Left, and wide BOS Distance walked: 30 ft Assistive device utilized: None Level of assistance: Modified independence  and SBA Comments: slowed, guarded pattern; pt reports this is normal per her baseline  PATIENT SURVEYS:  FOTO Intake 57 (risk adjusted 54); predicted 57  VESTIBULAR ASSESSMENT: GENERAL OBSERVATION: Guarded gait pattern, slowed movements in general   SYMPTOM BEHAVIOR:  Subjective history: Had major vertigo attack in 2012, hx of migraines.  Have history of Meniere's disease and have had therapy in the past (at [facility]).  Have been quite a bit better for >10 years, and manage my symptoms to be steady.  Had a migraine July 16th (with aura) and 29th awoke with vertigo and migraine, lasting 3 days.  Typically taking Valium will help with migraine/vertigo.  Had another 3 day bout with migraine and vertigo 07/22/23.  Saw PCP and sent me to PT.  Non-Vestibular symptoms: changes in hearing, tinnitus, and migraine symptoms  Type of dizziness: Imbalance (Disequilibrium), Unsteady with head/body turns, "Funny feeling in the head", "World moves", and unsteadiness with people moving around   Describes migraines with aura  Frequency: 2 bouts of 3 days in  past month  Duration: 3 days  Aggravating factors: Induced by position change: rolling to the left and sit to stand and Worse outside or in busy environment  Relieving factors: head stationary, slow movements, and Valium for migraines as needed  Progression of symptoms:  2 major episodes in past months; have not regained to baseline (mild imbalance-bilat vestibular hypofunction) Baseline symptoms 3/10 at eval Gets pulled to R side with gait (prolonged history)  OCULOMOTOR EXAM: (wears glasses, trifocals); reports sometimes she has to look away from fast movements  Ocular Alignment: normal  Ocular ROM: No Limitations and slowed reports mild increase in dizziness  Spontaneous Nystagmus: absent  Gaze-Induced Nystagmus:  end range nystagmus  Smooth Pursuits: saccades  Saccades:  overall intact, one quick movement return to center  Convergence/Divergence: 14 inches  VESTIBULAR - OCULAR REFLEX:   Slow VOR: Comment: slowed  VOR Cancellation: Corrective Saccades  Head-Impulse Test: HIT Right: positive HIT Left: positive Mild increase in symptoms; after this testing, rates symptoms as 4/10   M-CTSIB  Condition 1: Firm Surface, EO 30 Sec, Mild Sway  Condition 2: Firm Surface, EC 30 Sec, Mild and Moderate Sway  Condition 3: Foam Surface, EO 30 Sec, Moderate Sway  Condition 4: Foam Surface, EC 17.69 Sec, Severe Sway    MOTION SENSITIVITY:  Motion Sensitivity Quotient Intensity: 0 = none, 1 = Lightheaded, 2 = Mild, 3 = Moderate, 4 = Severe, 5 = Vomiting  Intensity  1. Sitting to supine   2. Supine to L side   3. Supine to R side   4. Supine to sitting   5. L Hallpike-Dix   6. Up from L    7. R Hallpike-Dix   8. Up from R    9. Sitting, head tipped to L knee   10. Head up from L knee   11. Sitting, head tipped to R knee   12. Head up from R knee   13. Sitting head turns x5   14.Sitting head nods x5   15. In stance, 180 turn to L    16. In stance, 180 turn to R     GOALS: Goals  reviewed with patient? Yes  SHORT TERM GOALS: Target date: 09/03/2023  Pt will be independent with HEP for improved balance, dizziness. Baseline: Goal status: IN PROGRESS  2.  FGA to improve 18/30 for decreased fall risk. Baseline: 14/30 Goal status: IN PROGRESS  3.  Pt will complete MCTSIB  Condition 4 for 30 seconds moderate sway for improved vestibular system use for balance. Baseline:  Goal status: IN PROGRESS  LONG TERM GOALS: Target date: 09/17/2023  Pt will be independent with HEP for improved balance, dizziness. Baseline:  Goal status: IN PROGRESS  2.  Pt will improve FOTO score to at least 57 to demonstrate improved overall functional mobility. Baseline: (risk adjusted score 54 at baseline) Goal status: IN PROGRESS  3.  Pt will report return to baseline balance and dizziness (due to pt's hx of Meniere's disease), compared to prior to onset of recent bouts of 3-day vertigo symptoms. Baseline:  Goal status: IN PROGRESS  ASSESSMENT:  CLINICAL IMPRESSION: ***The patient reports imbalance more prevalent than true dizziness. She tends to overcompensate for balance correction making imbalance more prevalent. PT began working on limits of stability (weight shift and sway on rocker board) to work on sway and reducing excessive reactive balance strategies. Plan to progress gaze to standing for HEP next session.  OBJECTIVE IMPAIRMENTS: Abnormal gait, decreased balance, and dizziness.   PLAN:  PT FREQUENCY: 1-2x/week  PT DURATION: 6 weeks plus eval  PLANNED INTERVENTIONS: Therapeutic exercises, Therapeutic activity, Neuromuscular re-education, Balance training, Gait training, Patient/Family education, Self Care, Vestibular training, Canalith repositioning, and Manual therapy  PLAN FOR NEXT SESSION: ***progress VOR to standing, Review HEP for vestibular system retraining, balance, VOR and gaze stabilization exercise for habituation.  Has hx of neck pain, so monitor neck with head  motions.

## 2023-08-24 ENCOUNTER — Encounter: Payer: Self-pay | Admitting: Physical Therapy

## 2023-08-24 ENCOUNTER — Ambulatory Visit: Payer: PPO | Admitting: Physical Therapy

## 2023-08-24 DIAGNOSIS — R2689 Other abnormalities of gait and mobility: Secondary | ICD-10-CM

## 2023-08-24 DIAGNOSIS — R42 Dizziness and giddiness: Secondary | ICD-10-CM | POA: Diagnosis not present

## 2023-08-24 DIAGNOSIS — R2681 Unsteadiness on feet: Secondary | ICD-10-CM

## 2023-08-24 DIAGNOSIS — M5416 Radiculopathy, lumbar region: Secondary | ICD-10-CM | POA: Diagnosis not present

## 2023-08-24 NOTE — Therapy (Signed)
OUTPATIENT PHYSICAL THERAPY VESTIBULAR TREATMENT NOTE   Patient Name: Lori Frost MRN: [id number] DOB:1946-05-13, 77 y.o., female Today's Date: 08/26/2023  END OF SESSION:  PT End of Session - 08/26/23 1014     Visit Number 6    Number of Visits 13    Date for PT Re-Evaluation 09/17/23    Authorization Type HTA    Progress Note Due on Visit 10    PT Start Time 0933    PT Stop Time 1013    PT Time Calculation (min) 40 min    Equipment Utilized During Treatment Gait belt   would require gait belt due to imbalance   Activity Tolerance Patient tolerated treatment well    Behavior During Therapy WFL for tasks assessed/performed              Past Medical History:  Diagnosis Date   Allergy    Non-Allergic Rhinitis   Balance problem due to vestibular dysfunction    Bloating    CAD (coronary artery disease)    Carotid artery occlusion    Cataract    Chordae tendineae rupture (HCC)    Cyst of ovary, right    Depression    Diverticulosis    DJD (degenerative joint disease)    Eczema    Family history of adverse reaction to anesthesia    SISTER "HEART STOPPED DURING NWGNFAO"1308 UNKNOWN CAUSE -  SHE DIED 10 YRS LATER OF HEART ATTACK   GERD (gastroesophageal reflux disease)    ESOPHAGITIS PRESENCE NOT SPECIFIED   Granuloma annulare    History of hiatal hernia    History of recurrent UTI (urinary tract infection)    Hypercholesterolemia    Hyperlipidemia    Lower back pain    Meniere's disease    Migraine    Mitral valve regurgitation    MODERATE NORMAL, MILD AORTIC REGURGITATION, MILD TRICUSPID REGURGITATION, LVH, GRADE 1 DIASTOLIC DYSFUNCTION, ECHO 2018 SUGGESTS RUPTURED CHORDAE TENDINEAE ( DR. Donnie Aho)   MVP (mitral valve prolapse)    Myalgia    OA (osteoarthritis) of hip 02/12/2016   Osteoporosis    Other nonrheumatic mitral valve disorders    Psoriasis    Pupil dilated    DUE TO EYE DROPS FOR TX OF RT  EYE PROBLEM   Reactive hypoglycemia    Sensitive skin     Shingles    Vertigo    Vestibular neuronitis    Vitamin D deficiency    Past Surgical History:  Procedure Laterality Date   EYE SURGERY Right 02-05-15   Cataract   EYE SURGERY Left 02-21-15   Cataract   TONSILLECTOMY     TOTAL HIP ARTHROPLASTY Right 02/12/2016   Procedure: RIGHT TOTAL HIP ARTHROPLASTY ANTERIOR APPROACH;  Surgeon: Ollen Gross, MD;  Location: [facility];  Service: Orthopedics;  Laterality: Right;   Patient Active Problem List   Diagnosis Date Noted   Mitral valve regurgitation 08/14/2021   Coronary artery calcification 08/14/2021   Aortic atherosclerosis (HCC) 08/14/2021   Hyperlipidemia 08/14/2021   Carotid artery disease (HCC) 08/14/2021   OA (osteoarthritis) of hip 02/12/2016    PCP: Sigmund Hazel, MD  REFERRING PROVIDER: Sigmund Hazel, MD  REFERRING DIAG: H81.03 (ICD-10-CM) - Meniere's disease, bilateral  THERAPY DIAG:  Dizziness and giddiness  Unsteadiness on feet  Other abnormalities of gait and mobility  ONSET DATE: 07/28/2023 (MD referral)  Rationale for Evaluation and Treatment: Rehabilitation  SUBJECTIVE:   SUBJECTIVE STATEMENT: Reports that she is in Tramadol and Prednisone for her back. Pain  is much better however reports that she is dealing with itching as a side effect of Tramadol.   Pt accompanied by: self  PERTINENT HISTORY: PER MD NOTE:  She consulted Dr. Ladona Horns on 07/12/2023 due to vertigo and migraines, which were associated with her Meniere's disease. The episodes occurred at 3 a.m. on 07/12/2023, she woke up with a spinning sensation that lasted for 3 days. The subsequent episode on 07/22/2023, which started at 7 p.m., lasted for 3 days. The following day, she felt lightheaded and dizzy, preventing her from attending church. She has a history of similar episodes, but none have occurred in years. During these episodes, she is unable to focus on one item and finds it difficult to walk. She has taken Valium twice, which provided some relief and  allowed her to sleep. She still experiences some symptoms upon waking, but they are not as severe.   Since 07/22/2023, she has been feeling much better, albeit fatigued. Today, she still experiences a sensation of her head being asleep, but no spinning. She has experienced tinnitus and slight hearing loss due to her Meniere's disease and bilateral vestibular imbalance, which she believes has worsened her balance issues. She is unable to be in crowds and needs to focus on her needs. Driving is difficult for her due to these episodes. She had a couple of migraines last year, but this vertigo and migraine combination has been some time. She has been taking a diuretic daily for years, which has helped, from [facility] who dx the Menieres. She has limited caffeine and follows a low-sodium diet as per her doctor's advice. [facility] gave her a referral for vestibular physical therapy, but wanted to make sure they did the correct kind. She did not go since she felt fine 2 mos ago at her check up. Last vertigo attack was years ago. She has 2 tablets of Valium left. She was diagnosed with migraines in her 16s.   PAIN:  Are you having pain? Yes: NPRS scale: 2/10 Pain location: R LB Pain description: throbbing Aggravating factors: step wrong, move suddenly  Relieving factors: Tylenol, lidocaine patch   PRECAUTIONS: Fall and Other: hx of Meniere's disease  WEIGHT BEARING RESTRICTIONS: No  FALLS: Has patient fallen in last 6 months? No  LIVING ENVIRONMENT: Lives with: lives alone Lives in: House/apartment Stairs:  1 step into home Has following equipment at home: Single point cane  PLOF: Independent and Leisure: participates in choir activities at church  PATIENT GOALS: To get back to baseline levels of balance/dizziness  OBJECTIVE:     TODAY'S TREATMENT: 08/26/23 Activity Comments  gait training with single walking pole 162ft Cueing for proper sequencing d/t limited carryover form last session    fwd/back stepping + head nods/turns 3x62ft each  Mild imbalance with nods, moderate imbalance with occasional min A with turns; no dizziness   Romberg + ball toss with tracking vertical and horizontal Occasional minA d/t imbalance   Wall bumps with EO/EC  Discontinued d/t pt reporting L buttock pain with this activity   Standing EC 30", then with head turns and nods 30" Most imbalance with static balance   backwards walking 4x64ft CGA, repeated cues for wider BOS  alt toe tap at step 6", then toe tap on 1st, 2nd step, down CGA but fairly good stability       HOME EXERCISE PROGRAM Access Code: [email or web address] URL: [email or web address] Date: 08/26/2023 Prepared by: [facility] - Outpatient  Rehab - [facility]  Exercises -  Seated Gaze Stabilization with Head Rotation  - 1 x daily - 7 x weekly - 3 sets - 30 sec hold - Seated Gaze Stabilization with Head Nod  - 1-2 x daily - 7 x weekly - 3 sets - 30 sec hold - Alternating Step Forward with Support  - 1 x daily - 5 x weekly - 2 sets - 10 reps - Seated Pelvic Tilt  - 1 x daily - 5 x weekly - 2 sets - 10 reps - Seated Thoracic Flexion and Rotation with Swiss Ball  - 1 x daily - 5 x weekly - 2 sets - 10 reps - 3 sec hold - Standing Balance in Corner with Eyes Closed  - 1 x daily - 5 x weekly - 2 sets - 30 sec hold - Corner Balance Feet Apart: Eyes Closed With Head Turns  - 1 x daily - 5 x weekly - 2 sets - 30 sec hold - Wide Stance with Eyes Closed and Head Nods  - 1 x daily - 5 x weekly - 2 sets - 30 sec hold    PATIENT EDUCATION: Education details: HEP update- to be performed in corner for safety Person educated: Patient Education method: Explanation, Demonstration, Tactile cues, Verbal cues, and Handouts Education comprehension: verbalized understanding and returned demonstration     -------------------------------------------------- Objective measures below taken at initial eval:  DIAGNOSTIC FINDINGS: NA for this bout  of therapy  COGNITION: Overall cognitive status: Within functional limits for tasks assessed  Cervical ROM:   Grossly tested [facility], but some soreness noted R cervical spin Active A/PROM (deg) eval  Flexion   Extension   Right lateral flexion   Left lateral flexion   Right rotation   Left rotation   (Blank rows = not tested)  TRANSFERS: Assistive device utilized: None  Sit to stand: Modified independence Stand to sit: Modified independence  GAIT: Gait pattern:  guarded gait, reaches for items/furniture to steady, step through pattern, decreased arm swing- Right, decreased arm swing- Left, and wide BOS Distance walked: 30 ft Assistive device utilized: None Level of assistance: Modified independence and SBA Comments: slowed, guarded pattern; pt reports this is normal per her baseline  PATIENT SURVEYS:  FOTO Intake 57 (risk adjusted 54); predicted 57  VESTIBULAR ASSESSMENT: GENERAL OBSERVATION: Guarded gait pattern, slowed movements in general   SYMPTOM BEHAVIOR:  Subjective history: Had major vertigo attack in 2012, hx of migraines.  Have history of Meniere's disease and have had therapy in the past (at [facility]).  Have been quite a bit better for >10 years, and manage my symptoms to be steady.  Had a migraine July 16th (with aura) and 29th awoke with vertigo and migraine, lasting 3 days.  Typically taking Valium will help with migraine/vertigo.  Had another 3 day bout with migraine and vertigo 07/22/23.  Saw PCP and sent me to PT.  Non-Vestibular symptoms: changes in hearing, tinnitus, and migraine symptoms  Type of dizziness: Imbalance (Disequilibrium), Unsteady with head/body turns, "Funny feeling in the head", "World moves", and unsteadiness with people moving around   Describes migraines with aura  Frequency: 2 bouts of 3 days in past month  Duration: 3 days  Aggravating factors: Induced by position change: rolling to the left and sit to stand and Worse outside or in busy  environment  Relieving factors: head stationary, slow movements, and Valium for migraines as needed  Progression of symptoms:  2 major episodes in past months; have not regained to baseline (mild imbalance-bilat  vestibular hypofunction) Baseline symptoms 3/10 at eval Gets pulled to R side with gait (prolonged history)  OCULOMOTOR EXAM: (wears glasses, trifocals); reports sometimes she has to look away from fast movements  Ocular Alignment: normal  Ocular ROM: No Limitations and slowed reports mild increase in dizziness  Spontaneous Nystagmus: absent  Gaze-Induced Nystagmus:  end range nystagmus  Smooth Pursuits: saccades  Saccades:  overall intact, one quick movement return to center  Convergence/Divergence: 14 inches  VESTIBULAR - OCULAR REFLEX:   Slow VOR: Comment: slowed  VOR Cancellation: Corrective Saccades  Head-Impulse Test: HIT Right: positive HIT Left: positive Mild increase in symptoms; after this testing, rates symptoms as 4/10   M-CTSIB  Condition 1: Firm Surface, EO 30 Sec, Mild Sway  Condition 2: Firm Surface, EC 30 Sec, Mild and Moderate Sway  Condition 3: Foam Surface, EO 30 Sec, Moderate Sway  Condition 4: Foam Surface, EC 17.69 Sec, Severe Sway    MOTION SENSITIVITY:  Motion Sensitivity Quotient Intensity: 0 = none, 1 = Lightheaded, 2 = Mild, 3 = Moderate, 4 = Severe, 5 = Vomiting  Intensity  1. Sitting to supine   2. Supine to L side   3. Supine to R side   4. Supine to sitting   5. L Hallpike-Dix   6. Up from L    7. R Hallpike-Dix   8. Up from R    9. Sitting, head tipped to L knee   10. Head up from L knee   11. Sitting, head tipped to R knee   12. Head up from R knee   13. Sitting head turns x5   14.Sitting head nods x5   15. In stance, 180 turn to L    16. In stance, 180 turn to R     GOALS: Goals reviewed with patient? Yes  SHORT TERM GOALS: Target date: 09/03/2023  Pt will be independent with HEP for improved balance,  dizziness. Baseline: Goal status: IN PROGRESS  2.  FGA to improve 18/30 for decreased fall risk. Baseline: 14/30 Goal status: IN PROGRESS  3.  Pt will complete MCTSIB Condition 4 for 30 seconds moderate sway for improved vestibular system use for balance. Baseline:  Goal status: IN PROGRESS  LONG TERM GOALS: Target date: 09/17/2023  Pt will be independent with HEP for improved balance, dizziness. Baseline:  Goal status: IN PROGRESS  2.  Pt will improve FOTO score to at least 57 to demonstrate improved overall functional mobility. Baseline: (risk adjusted score 54 at baseline) Goal status: IN PROGRESS  3.  Pt will report return to baseline balance and dizziness (due to pt's hx of Meniere's disease), compared to prior to onset of recent bouts of 3-day vertigo symptoms. Baseline:  Goal status: IN PROGRESS   ASSESSMENT:  CLINICAL IMPRESSION: Patient arrived to session with report of improved LBP but dealing with some medication side effects. Reviewed gait training with walking pole with cues for proper sequencing. Added head movements into gait and balance tasks, with patient more sensitive to head turns than nods. Discontinued wall bumps d/t pt reporting L buttock pain with this activity but able to continue with other EC tasks without issues. HEP was updated to reflect more challenging balance tasks that have been performed in-session; patient reported understanding. No complaints upon leaving.  OBJECTIVE IMPAIRMENTS: Abnormal gait, decreased balance, and dizziness.   PLAN:  PT FREQUENCY: 1-2x/week  PT DURATION: 6 weeks plus eval  PLANNED INTERVENTIONS: Therapeutic exercises, Therapeutic activity, Neuromuscular re-education, Balance training, Gait  training, Patient/Family education, Self Care, Vestibular training, Canalith repositioning, and Manual therapy  PLAN FOR NEXT SESSION: progress VOR to standing, Review HEP for vestibular system retraining, balance, VOR and gaze  stabilization exercise for habituation.  Has hx of neck pain, so monitor neck with head motions.     Anette Guarneri, PT, DPT 08/26/23 10:15 AM  [facility] [address] [place], [address] [place], [place] [address] Phone # [phone] Fax # [phone]

## 2023-08-26 ENCOUNTER — Ambulatory Visit: Payer: PPO | Admitting: Physical Therapy

## 2023-08-26 ENCOUNTER — Encounter: Payer: Self-pay | Admitting: Physical Therapy

## 2023-08-26 DIAGNOSIS — R42 Dizziness and giddiness: Secondary | ICD-10-CM

## 2023-08-26 DIAGNOSIS — R2681 Unsteadiness on feet: Secondary | ICD-10-CM

## 2023-08-26 DIAGNOSIS — R2689 Other abnormalities of gait and mobility: Secondary | ICD-10-CM

## 2023-08-30 NOTE — Therapy (Signed)
OUTPATIENT PHYSICAL THERAPY VESTIBULAR TREATMENT NOTE   Patient Name: Lori Frost MRN: [id number] DOB:[birth year], 77 y.o., female Today's Date: 08/31/2023  END OF SESSION:  PT End of Session - 08/31/23 1011     Visit Number 7    Number of Visits 13    Date for PT Re-Evaluation 09/17/23    Authorization Type HTA    Progress Note Due on Visit 10    PT Start Time 0930    PT Stop Time 1010    PT Time Calculation (min) 40 min    Equipment Utilized During Treatment Gait belt   would require gait belt due to imbalance   Activity Tolerance Patient tolerated treatment well    Behavior During Therapy WFL for tasks assessed/performed               Past Medical History:  Diagnosis Date   Allergy    Non-Allergic Rhinitis   Balance problem due to vestibular dysfunction    Bloating    CAD (coronary artery disease)    Carotid artery occlusion    Cataract    Chordae tendineae rupture (HCC)    Cyst of ovary, right    Depression    Diverticulosis    DJD (degenerative joint disease)    Eczema    Family history of adverse reaction to anesthesia    SISTER "HEART STOPPED DURING NUUVOZD"6644 UNKNOWN CAUSE -  SHE DIED 10 YRS LATER OF HEART ATTACK   GERD (gastroesophageal reflux disease)    ESOPHAGITIS PRESENCE NOT SPECIFIED   Granuloma annulare    History of hiatal hernia    History of recurrent UTI (urinary tract infection)    Hypercholesterolemia    Hyperlipidemia    Lower back pain    Meniere's disease    Migraine    Mitral valve regurgitation    MODERATE NORMAL, MILD AORTIC REGURGITATION, MILD TRICUSPID REGURGITATION, LVH, GRADE 1 DIASTOLIC DYSFUNCTION, ECHO 2018 SUGGESTS RUPTURED CHORDAE TENDINEAE ( DR. Donnie Aho)   MVP (mitral valve prolapse)    Myalgia    OA (osteoarthritis) of hip 02/12/2016   Osteoporosis    Other nonrheumatic mitral valve disorders    Psoriasis    Pupil dilated    DUE TO EYE DROPS FOR TX OF RT  EYE PROBLEM   Reactive hypoglycemia    Sensitive skin     Shingles    Vertigo    Vestibular neuronitis    Vitamin D deficiency    Past Surgical History:  Procedure Laterality Date   EYE SURGERY Right 02-05-15   Cataract   EYE SURGERY Left 02-21-15   Cataract   TONSILLECTOMY     TOTAL HIP ARTHROPLASTY Right 02/12/2016   Procedure: RIGHT TOTAL HIP ARTHROPLASTY ANTERIOR APPROACH;  Surgeon: Ollen Gross, MD;  Location: [facility];  Service: Orthopedics;  Laterality: Right;   Patient Active Problem List   Diagnosis Date Noted   Mitral valve regurgitation 08/14/2021   Coronary artery calcification 08/14/2021   Aortic atherosclerosis (HCC) 08/14/2021   Hyperlipidemia 08/14/2021   Carotid artery disease (HCC) 08/14/2021   OA (osteoarthritis) of hip 02/12/2016    PCP: Sigmund Hazel, MD  REFERRING PROVIDER: Sigmund Hazel, MD  REFERRING DIAG: H81.03 (ICD-10-CM) - Meniere's disease, bilateral  THERAPY DIAG:  Dizziness and giddiness  Unsteadiness on feet  Other abnormalities of gait and mobility  ONSET DATE: 07/28/2023 (MD referral)  Rationale for Evaluation and Treatment: Rehabilitation  SUBJECTIVE:   SUBJECTIVE STATEMENT: "It's going okay. My back and hip is a problem." Reports  that sitting in the chairs at church made her back feel worse. Reports that overall her dizziness is about the same.   Pt accompanied by: self  PERTINENT HISTORY: PER MD NOTE:  She consulted Dr. Ladona Horns on 07/12/2023 due to vertigo and migraines, which were associated with her Meniere's disease. The episodes occurred at 3 a.m. on 07/12/2023, she woke up with a spinning sensation that lasted for 3 days. The subsequent episode on 07/22/2023, which started at 7 p.m., lasted for 3 days. The following day, she felt lightheaded and dizzy, preventing her from attending church. She has a history of similar episodes, but none have occurred in years. During these episodes, she is unable to focus on one item and finds it difficult to walk. She has taken Valium twice, which provided  some relief and allowed her to sleep. She still experiences some symptoms upon waking, but they are not as severe.   Since 07/22/2023, she has been feeling much better, albeit fatigued. Today, she still experiences a sensation of her head being asleep, but no spinning. She has experienced tinnitus and slight hearing loss due to her Meniere's disease and bilateral vestibular imbalance, which she believes has worsened her balance issues. She is unable to be in crowds and needs to focus on her needs. Driving is difficult for her due to these episodes. She had a couple of migraines last year, but this vertigo and migraine combination has been some time. She has been taking a diuretic daily for years, which has helped, from [facility] who dx the Menieres. She has limited caffeine and follows a low-sodium diet as per her doctor's advice. [facility] gave her a referral for vestibular physical therapy, but wanted to make sure they did the correct kind. She did not go since she felt fine 2 mos ago at her check up. Last vertigo attack was years ago. She has 2 tablets of Valium left. She was diagnosed with migraines in her 29s.   PAIN:  Are you having pain? Yes: NPRS scale: 3/10 Pain location: R LB Pain description: throbbing Aggravating factors: step wrong, move suddenly  Relieving factors: Tylenol, lidocaine patch   PRECAUTIONS: Fall and Other: hx of Meniere's disease  WEIGHT BEARING RESTRICTIONS: No  FALLS: Has patient fallen in last 6 months? No  LIVING ENVIRONMENT: Lives with: lives alone Lives in: House/apartment Stairs:  1 step into home Has following equipment at home: Single point cane  PLOF: Independent and Leisure: participates in choir activities at church  PATIENT GOALS: To get back to baseline levels of balance/dizziness  OBJECTIVE:     TODAY'S TREATMENT: 08/31/23 Activity Comments  fwd/back stepping + head nods/turns 2x10  With each foot; 1 UE support; moderate unsteadiness and  dizziness with head turns and CGA throughout; noted nausea with head nods on 2nd set which required a rest break. Able to continue   alt UE/LE raise with 1 foot on step 30" each Good stability   head turns/nods with 1 foot on step 2x30" each Tendency to shift towards R; occasional reaching strategy for self-correction   EC with 1 foot on step 2x30" each occasional reaching strategy for self-correction; good response to cues for tightening core and hips   walking + ball toss vertical with tracking  Moderate instability requiring CGA-min A; no dizziness              PATIENT EDUCATION: Education details: discussion of POC, HEP update with edu for safety Person educated: Patient Education method: Explanation,  Demonstration, Tactile cues, Verbal cues, and Handouts Education comprehension: verbalized understanding and returned demonstration    HOME EXERCISE PROGRAM Access Code: [email or web address] URL: [email or web address] Date: 08/31/2023 Prepared by: [facility] - Outpatient  Rehab - [facility]  Exercises - Seated Gaze Stabilization with Head Rotation  - 1 x daily - 7 x weekly - 3 sets - 30 sec hold - Seated Gaze Stabilization with Head Nod  - 1-2 x daily - 7 x weekly - 3 sets - 30 sec hold - Alternating Step Forward with Support  - 1 x daily - 5 x weekly - 2 sets - 10 reps - Seated Pelvic Tilt  - 1 x daily - 5 x weekly - 2 sets - 10 reps - Seated Thoracic Flexion and Rotation with Swiss Ball  - 1 x daily - 5 x weekly - 2 sets - 10 reps - 3 sec hold - Standing Balance in Corner with Eyes Closed  - 1 x daily - 5 x weekly - 2 sets - 30 sec hold - Corner Balance Feet Apart: Eyes Closed With Head Turns  - 1 x daily - 5 x weekly - 2 sets - 30 sec hold - Wide Stance with Eyes Closed and Head Nods  - 1 x daily - 5 x weekly - 2 sets - 30 sec hold - standing 1 foot on step with EC  - 1 x daily - 5 x weekly - 2 sets - 30 sec  hold   -------------------------------------------------- Objective measures below taken at initial eval:  DIAGNOSTIC FINDINGS: NA for this bout of therapy  COGNITION: Overall cognitive status: Within functional limits for tasks assessed  Cervical ROM:   Grossly tested [facility], but some soreness noted R cervical spin Active A/PROM (deg) eval  Flexion   Extension   Right lateral flexion   Left lateral flexion   Right rotation   Left rotation   (Blank rows = not tested)  TRANSFERS: Assistive device utilized: None  Sit to stand: Modified independence Stand to sit: Modified independence  GAIT: Gait pattern:  guarded gait, reaches for items/furniture to steady, step through pattern, decreased arm swing- Right, decreased arm swing- Left, and wide BOS Distance walked: 30 ft Assistive device utilized: None Level of assistance: Modified independence and SBA Comments: slowed, guarded pattern; pt reports this is normal per her baseline  PATIENT SURVEYS:  FOTO Intake 57 (risk adjusted 54); predicted 57  VESTIBULAR ASSESSMENT: GENERAL OBSERVATION: Guarded gait pattern, slowed movements in general   SYMPTOM BEHAVIOR:  Subjective history: Had major vertigo attack in 2012, hx of migraines.  Have history of Meniere's disease and have had therapy in the past (at [facility]).  Have been quite a bit better for >10 years, and manage my symptoms to be steady.  Had a migraine July 16th (with aura) and 29th awoke with vertigo and migraine, lasting 3 days.  Typically taking Valium will help with migraine/vertigo.  Had another 3 day bout with migraine and vertigo 07/22/23.  Saw PCP and sent me to PT.  Non-Vestibular symptoms: changes in hearing, tinnitus, and migraine symptoms  Type of dizziness: Imbalance (Disequilibrium), Unsteady with head/body turns, "Funny feeling in the head", "World moves", and unsteadiness with people moving around   Describes migraines with aura  Frequency: 2 bouts of 3 days in  past month  Duration: 3 days  Aggravating factors: Induced by position change: rolling to the left and sit to stand and Worse outside or in busy environment  Relieving factors: head  stationary, slow movements, and Valium for migraines as needed  Progression of symptoms:  2 major episodes in past months; have not regained to baseline (mild imbalance-bilat vestibular hypofunction) Baseline symptoms 3/10 at eval Gets pulled to R side with gait (prolonged history)  OCULOMOTOR EXAM: (wears glasses, trifocals); reports sometimes she has to look away from fast movements  Ocular Alignment: normal  Ocular ROM: No Limitations and slowed reports mild increase in dizziness  Spontaneous Nystagmus: absent  Gaze-Induced Nystagmus:  end range nystagmus  Smooth Pursuits: saccades  Saccades:  overall intact, one quick movement return to center  Convergence/Divergence: 14 inches  VESTIBULAR - OCULAR REFLEX:   Slow VOR: Comment: slowed  VOR Cancellation: Corrective Saccades  Head-Impulse Test: HIT Right: positive HIT Left: positive Mild increase in symptoms; after this testing, rates symptoms as 4/10   M-CTSIB  Condition 1: Firm Surface, EO 30 Sec, Mild Sway  Condition 2: Firm Surface, EC 30 Sec, Mild and Moderate Sway  Condition 3: Foam Surface, EO 30 Sec, Moderate Sway  Condition 4: Foam Surface, EC 17.69 Sec, Severe Sway    MOTION SENSITIVITY:  Motion Sensitivity Quotient Intensity: 0 = none, 1 = Lightheaded, 2 = Mild, 3 = Moderate, 4 = Severe, 5 = Vomiting  Intensity  1. Sitting to supine   2. Supine to L side   3. Supine to R side   4. Supine to sitting   5. L Hallpike-Dix   6. Up from L    7. R Hallpike-Dix   8. Up from R    9. Sitting, head tipped to L knee   10. Head up from L knee   11. Sitting, head tipped to R knee   12. Head up from R knee   13. Sitting head turns x5   14.Sitting head nods x5   15. In stance, 180 turn to L    16. In stance, 180 turn to R     GOALS: Goals  reviewed with patient? Yes  SHORT TERM GOALS: Target date: 09/03/2023  Pt will be independent with HEP for improved balance, dizziness. Baseline: Goal status: IN PROGRESS  2.  FGA to improve 18/30 for decreased fall risk. Baseline: 14/30 Goal status: IN PROGRESS  3.  Pt will complete MCTSIB Condition 4 for 30 seconds moderate sway for improved vestibular system use for balance. Baseline:  Goal status: IN PROGRESS  LONG TERM GOALS: Target date: 09/17/2023  Pt will be independent with HEP for improved balance, dizziness. Baseline:  Goal status: IN PROGRESS  2.  Pt will improve FOTO score to at least 57 to demonstrate improved overall functional mobility. Baseline: (risk adjusted score 54 at baseline) Goal status: IN PROGRESS  3.  Pt will report return to baseline balance and dizziness (due to pt's hx of Meniere's disease), compared to prior to onset of recent bouts of 3-day vertigo symptoms. Baseline:  Goal status: IN PROGRESS   ASSESSMENT:  CLINICAL IMPRESSION: Patient arrived to session with report of exacerbation of back pain after sitting in the chairs at church. Dynamic gaze stabilization tasks today brought on most dizziness and unsteadiness with head turns, however patient did c/o nausea mid-exercise during head nods which quickly resolved. Single leg stability tasks revealed most challenge with addition of head movements and EC. HEP was updated to include exercise that was safely performed today. Patient tolerated session well and without complaints upon leaving.  OBJECTIVE IMPAIRMENTS: Abnormal gait, decreased balance, and dizziness.   PLAN:  PT FREQUENCY: 1-2x/week  PT DURATION: 6 weeks plus eval  PLANNED INTERVENTIONS: Therapeutic exercises, Therapeutic activity, Neuromuscular re-education, Balance training, Gait training, Patient/Family education, Self Care, Vestibular training, Canalith repositioning, and Manual therapy  PLAN FOR NEXT SESSION: recert vs. DC;  progress VOR to standing, Review HEP for vestibular system retraining, balance, VOR and gaze stabilization exercise for habituation.  Has hx of neck pain, so monitor neck with head motions.     Anette Guarneri, PT, DPT 08/31/23 10:12 AM  [facility] at [facility] [address] [place], [address] [place], [place] [address] Phone # [phone] Fax # [phone]

## 2023-08-31 ENCOUNTER — Ambulatory Visit: Payer: PPO | Admitting: Physical Therapy

## 2023-08-31 ENCOUNTER — Encounter: Payer: Self-pay | Admitting: Physical Therapy

## 2023-08-31 DIAGNOSIS — R42 Dizziness and giddiness: Secondary | ICD-10-CM | POA: Diagnosis not present

## 2023-08-31 DIAGNOSIS — R2681 Unsteadiness on feet: Secondary | ICD-10-CM

## 2023-08-31 DIAGNOSIS — R2689 Other abnormalities of gait and mobility: Secondary | ICD-10-CM

## 2023-09-01 NOTE — Therapy (Signed)
OUTPATIENT PHYSICAL THERAPY VESTIBULAR TREATMENT NOTE   Patient Name: Lori Frost MRN: [id number] DOB:03/28/1946, 77 y.o., female Today's Date: 09/01/2023  END OF SESSION:      Past Medical History:  Diagnosis Date   Allergy    Non-Allergic Rhinitis   Balance problem due to vestibular dysfunction    Bloating    CAD (coronary artery disease)    Carotid artery occlusion    Cataract    Chordae tendineae rupture (HCC)    Cyst of ovary, right    Depression    Diverticulosis    DJD (degenerative joint disease)    Eczema    Family history of adverse reaction to anesthesia    SISTER "HEART STOPPED DURING VHQIONG"2952 UNKNOWN CAUSE -  SHE DIED 10 YRS LATER OF HEART ATTACK   GERD (gastroesophageal reflux disease)    ESOPHAGITIS PRESENCE NOT SPECIFIED   Granuloma annulare    History of hiatal hernia    History of recurrent UTI (urinary tract infection)    Hypercholesterolemia    Hyperlipidemia    Lower back pain    Meniere's disease    Migraine    Mitral valve regurgitation    MODERATE NORMAL, MILD AORTIC REGURGITATION, MILD TRICUSPID REGURGITATION, LVH, GRADE 1 DIASTOLIC DYSFUNCTION, ECHO 2018 SUGGESTS RUPTURED CHORDAE TENDINEAE ( DR. Donnie Aho)   MVP (mitral valve prolapse)    Myalgia    OA (osteoarthritis) of hip 02/12/2016   Osteoporosis    Other nonrheumatic mitral valve disorders    Psoriasis    Pupil dilated    DUE TO EYE DROPS FOR TX OF RT  EYE PROBLEM   Reactive hypoglycemia    Sensitive skin    Shingles    Vertigo    Vestibular neuronitis    Vitamin D deficiency    Past Surgical History:  Procedure Laterality Date   EYE SURGERY Right 02-05-15   Cataract   EYE SURGERY Left 02-21-15   Cataract   TONSILLECTOMY     TOTAL HIP ARTHROPLASTY Right 02/12/2016   Procedure: RIGHT TOTAL HIP ARTHROPLASTY ANTERIOR APPROACH;  Surgeon: Ollen Gross, MD;  Location: [facility];  Service: Orthopedics;  Laterality: Right;   Patient Active Problem List   Diagnosis Date Noted    Mitral valve regurgitation 08/14/2021   Coronary artery calcification 08/14/2021   Aortic atherosclerosis (HCC) 08/14/2021   Hyperlipidemia 08/14/2021   Carotid artery disease (HCC) 08/14/2021   OA (osteoarthritis) of hip 02/12/2016    PCP: Sigmund Hazel, MD  REFERRING PROVIDER: Sigmund Hazel, MD  REFERRING DIAG: H81.03 (ICD-10-CM) - Meniere's disease, bilateral  THERAPY DIAG:  No diagnosis found.  ONSET DATE: 07/28/2023 (MD referral)  Rationale for Evaluation and Treatment: Rehabilitation  SUBJECTIVE:   SUBJECTIVE STATEMENT: "It's going okay. My back and hip is a problem." Reports that sitting in the chairs at church made her back feel worse. Reports that overall her dizziness is about the same.   Pt accompanied by: self  PERTINENT HISTORY: PER MD NOTE:  She consulted Dr. Ladona Horns on 07/12/2023 due to vertigo and migraines, which were associated with her Meniere's disease. The episodes occurred at 3 a.m. on 07/12/2023, she woke up with a spinning sensation that lasted for 3 days. The subsequent episode on 07/22/2023, which started at 7 p.m., lasted for 3 days. The following day, she felt lightheaded and dizzy, preventing her from attending church. She has a history of similar episodes, but none have occurred in years. During these episodes, she is unable to focus on one item and  finds it difficult to walk. She has taken Valium twice, which provided some relief and allowed her to sleep. She still experiences some symptoms upon waking, but they are not as severe.   Since 07/22/2023, she has been feeling much better, albeit fatigued. Today, she still experiences a sensation of her head being asleep, but no spinning. She has experienced tinnitus and slight hearing loss due to her Meniere's disease and bilateral vestibular imbalance, which she believes has worsened her balance issues. She is unable to be in crowds and needs to focus on her needs. Driving is difficult for her due to these episodes.  She had a couple of migraines last year, but this vertigo and migraine combination has been some time. She has been taking a diuretic daily for years, which has helped, from [facility] who dx the Menieres. She has limited caffeine and follows a low-sodium diet as per her doctor's advice. [facility] gave her a referral for vestibular physical therapy, but wanted to make sure they did the correct kind. She did not go since she felt fine 2 mos ago at her check up. Last vertigo attack was years ago. She has 2 tablets of Valium left. She was diagnosed with migraines in her 76s.   PAIN:  Are you having pain? Yes: NPRS scale: 3/10 Pain location: R LB Pain description: throbbing Aggravating factors: step wrong, move suddenly  Relieving factors: Tylenol, lidocaine patch   PRECAUTIONS: Fall and Other: hx of Meniere's disease  WEIGHT BEARING RESTRICTIONS: No  FALLS: Has patient fallen in last 6 months? No  LIVING ENVIRONMENT: Lives with: lives alone Lives in: House/apartment Stairs:  1 step into home Has following equipment at home: Single point cane  PLOF: Independent and Leisure: participates in choir activities at church  PATIENT GOALS: To get back to baseline levels of balance/dizziness  OBJECTIVE:    TODAY'S TREATMENT: 09/02/23 Activity Comments                          HOME EXERCISE PROGRAM Access Code: [email or web address] URL: [email or web address] Date: 08/31/2023 Prepared by: [facility] - Outpatient  Rehab - [facility]  Exercises - Seated Gaze Stabilization with Head Rotation  - 1 x daily - 7 x weekly - 3 sets - 30 sec hold - Seated Gaze Stabilization with Head Nod  - 1-2 x daily - 7 x weekly - 3 sets - 30 sec hold - Alternating Step Forward with Support  - 1 x daily - 5 x weekly - 2 sets - 10 reps - Seated Pelvic Tilt  - 1 x daily - 5 x weekly - 2 sets - 10 reps - Seated Thoracic Flexion and Rotation with Swiss Ball  - 1 x daily - 5 x weekly - 2 sets - 10 reps -  3 sec hold - Standing Balance in Corner with Eyes Closed  - 1 x daily - 5 x weekly - 2 sets - 30 sec hold - Corner Balance Feet Apart: Eyes Closed With Head Turns  - 1 x daily - 5 x weekly - 2 sets - 30 sec hold - Wide Stance with Eyes Closed and Head Nods  - 1 x daily - 5 x weekly - 2 sets - 30 sec hold - standing 1 foot on step with EC  - 1 x daily - 5 x weekly - 2 sets - 30 sec hold   -------------------------------------------------- Objective measures below taken at initial eval:  DIAGNOSTIC  FINDINGS: NA for this bout of therapy  COGNITION: Overall cognitive status: Within functional limits for tasks assessed  Cervical ROM:   Grossly tested [facility], but some soreness noted R cervical spin Active A/PROM (deg) eval  Flexion   Extension   Right lateral flexion   Left lateral flexion   Right rotation   Left rotation   (Blank rows = not tested)  TRANSFERS: Assistive device utilized: None  Sit to stand: Modified independence Stand to sit: Modified independence  GAIT: Gait pattern:  guarded gait, reaches for items/furniture to steady, step through pattern, decreased arm swing- Right, decreased arm swing- Left, and wide BOS Distance walked: 30 ft Assistive device utilized: None Level of assistance: Modified independence and SBA Comments: slowed, guarded pattern; pt reports this is normal per her baseline  PATIENT SURVEYS:  FOTO Intake 57 (risk adjusted 54); predicted 57  VESTIBULAR ASSESSMENT: GENERAL OBSERVATION: Guarded gait pattern, slowed movements in general   SYMPTOM BEHAVIOR:  Subjective history: Had major vertigo attack in 2012, hx of migraines.  Have history of Meniere's disease and have had therapy in the past (at [facility]).  Have been quite a bit better for >10 years, and manage my symptoms to be steady.  Had a migraine July 16th (with aura) and 29th awoke with vertigo and migraine, lasting 3 days.  Typically taking Valium will help with migraine/vertigo.  Had another  3 day bout with migraine and vertigo 07/22/23.  Saw PCP and sent me to PT.  Non-Vestibular symptoms: changes in hearing, tinnitus, and migraine symptoms  Type of dizziness: Imbalance (Disequilibrium), Unsteady with head/body turns, "Funny feeling in the head", "World moves", and unsteadiness with people moving around   Describes migraines with aura  Frequency: 2 bouts of 3 days in past month  Duration: 3 days  Aggravating factors: Induced by position change: rolling to the left and sit to stand and Worse outside or in busy environment  Relieving factors: head stationary, slow movements, and Valium for migraines as needed  Progression of symptoms:  2 major episodes in past months; have not regained to baseline (mild imbalance-bilat vestibular hypofunction) Baseline symptoms 3/10 at eval Gets pulled to R side with gait (prolonged history)  OCULOMOTOR EXAM: (wears glasses, trifocals); reports sometimes she has to look away from fast movements  Ocular Alignment: normal  Ocular ROM: No Limitations and slowed reports mild increase in dizziness  Spontaneous Nystagmus: absent  Gaze-Induced Nystagmus:  end range nystagmus  Smooth Pursuits: saccades  Saccades:  overall intact, one quick movement return to center  Convergence/Divergence: 14 inches  VESTIBULAR - OCULAR REFLEX:   Slow VOR: Comment: slowed  VOR Cancellation: Corrective Saccades  Head-Impulse Test: HIT Right: positive HIT Left: positive Mild increase in symptoms; after this testing, rates symptoms as 4/10   M-CTSIB  Condition 1: Firm Surface, EO 30 Sec, Mild Sway  Condition 2: Firm Surface, EC 30 Sec, Mild and Moderate Sway  Condition 3: Foam Surface, EO 30 Sec, Moderate Sway  Condition 4: Foam Surface, EC 17.69 Sec, Severe Sway    MOTION SENSITIVITY:  Motion Sensitivity Quotient Intensity: 0 = none, 1 = Lightheaded, 2 = Mild, 3 = Moderate, 4 = Severe, 5 = Vomiting  Intensity  1. Sitting to supine   2. Supine to L side   3.  Supine to R side   4. Supine to sitting   5. L Hallpike-Dix   6. Up from L    7. R Hallpike-Dix   8. Up from R  9. Sitting, head tipped to L knee   10. Head up from L knee   11. Sitting, head tipped to R knee   12. Head up from R knee   13. Sitting head turns x5   14.Sitting head nods x5   15. In stance, 180 turn to L    16. In stance, 180 turn to R     GOALS: Goals reviewed with patient? Yes  SHORT TERM GOALS: Target date: 09/03/2023  Pt will be independent with HEP for improved balance, dizziness. Baseline: Goal status: IN PROGRESS  2.  FGA to improve 18/30 for decreased fall risk. Baseline: 14/30 Goal status: IN PROGRESS  3.  Pt will complete MCTSIB Condition 4 for 30 seconds moderate sway for improved vestibular system use for balance. Baseline:  Goal status: IN PROGRESS  LONG TERM GOALS: Target date: 09/17/2023  Pt will be independent with HEP for improved balance, dizziness. Baseline:  Goal status: IN PROGRESS  2.  Pt will improve FOTO score to at least 57 to demonstrate improved overall functional mobility. Baseline: (risk adjusted score 54 at baseline) Goal status: IN PROGRESS  3.  Pt will report return to baseline balance and dizziness (due to pt's hx of Meniere's disease), compared to prior to onset of recent bouts of 3-day vertigo symptoms. Baseline:  Goal status: IN PROGRESS   ASSESSMENT:  CLINICAL IMPRESSION: Patient arrived to session with report of exacerbation of back pain after sitting in the chairs at church. Dynamic gaze stabilization tasks today brought on most dizziness and unsteadiness with head turns, however patient did c/o nausea mid-exercise during head nods which quickly resolved. Single leg stability tasks revealed most challenge with addition of head movements and EC. HEP was updated to include exercise that was safely performed today. Patient tolerated session well and without complaints upon leaving.  OBJECTIVE IMPAIRMENTS: Abnormal  gait, decreased balance, and dizziness.   PLAN:  PT FREQUENCY: 1-2x/week  PT DURATION: 6 weeks plus eval  PLANNED INTERVENTIONS: Therapeutic exercises, Therapeutic activity, Neuromuscular re-education, Balance training, Gait training, Patient/Family education, Self Care, Vestibular training, Canalith repositioning, and Manual therapy  PLAN FOR NEXT SESSION: recert vs. DC; progress VOR to standing, Review HEP for vestibular system retraining, balance, VOR and gaze stabilization exercise for habituation.  Has hx of neck pain, so monitor neck with head motions.     Anette Guarneri, PT, DPT 09/01/23 11:00 AM  [facility] [address] [place], [address] [place], [place] [address] Phone # [phone] Fax # [phone]

## 2023-09-02 ENCOUNTER — Encounter: Payer: Self-pay | Admitting: Physical Therapy

## 2023-09-02 ENCOUNTER — Ambulatory Visit: Payer: PPO | Admitting: Physical Therapy

## 2023-09-02 DIAGNOSIS — R42 Dizziness and giddiness: Secondary | ICD-10-CM | POA: Diagnosis not present

## 2023-09-02 DIAGNOSIS — R2689 Other abnormalities of gait and mobility: Secondary | ICD-10-CM

## 2023-09-02 DIAGNOSIS — R2681 Unsteadiness on feet: Secondary | ICD-10-CM

## 2023-10-09 DIAGNOSIS — M5416 Radiculopathy, lumbar region: Secondary | ICD-10-CM | POA: Diagnosis not present

## 2023-10-16 DIAGNOSIS — M5459 Other low back pain: Secondary | ICD-10-CM | POA: Diagnosis not present

## 2023-10-18 ENCOUNTER — Other Ambulatory Visit: Payer: Self-pay | Admitting: Family Medicine

## 2023-10-18 DIAGNOSIS — Z1231 Encounter for screening mammogram for malignant neoplasm of breast: Secondary | ICD-10-CM

## 2023-10-20 DIAGNOSIS — H04123 Dry eye syndrome of bilateral lacrimal glands: Secondary | ICD-10-CM | POA: Diagnosis not present

## 2023-10-20 DIAGNOSIS — L821 Other seborrheic keratosis: Secondary | ICD-10-CM | POA: Diagnosis not present

## 2023-10-20 DIAGNOSIS — L920 Granuloma annulare: Secondary | ICD-10-CM | POA: Diagnosis not present

## 2023-10-20 DIAGNOSIS — H538 Other visual disturbances: Secondary | ICD-10-CM | POA: Diagnosis not present

## 2023-11-05 DIAGNOSIS — M5416 Radiculopathy, lumbar region: Secondary | ICD-10-CM | POA: Diagnosis not present

## 2023-11-09 DIAGNOSIS — M48062 Spinal stenosis, lumbar region with neurogenic claudication: Secondary | ICD-10-CM | POA: Diagnosis not present

## 2023-11-09 DIAGNOSIS — M5416 Radiculopathy, lumbar region: Secondary | ICD-10-CM | POA: Diagnosis not present

## 2023-11-24 DIAGNOSIS — Z961 Presence of intraocular lens: Secondary | ICD-10-CM | POA: Diagnosis not present

## 2023-11-24 DIAGNOSIS — H04123 Dry eye syndrome of bilateral lacrimal glands: Secondary | ICD-10-CM | POA: Diagnosis not present

## 2023-11-24 DIAGNOSIS — H538 Other visual disturbances: Secondary | ICD-10-CM | POA: Diagnosis not present

## 2023-12-03 ENCOUNTER — Ambulatory Visit
Admission: RE | Admit: 2023-12-03 | Discharge: 2023-12-03 | Disposition: A | Discharge status: Home / Self Care | Payer: PPO | Source: Ambulatory Visit | Attending: Family Medicine | Admitting: Family Medicine

## 2023-12-03 DIAGNOSIS — Z1231 Encounter for screening mammogram for malignant neoplasm of breast: Secondary | ICD-10-CM | POA: Diagnosis not present

## 2023-12-23 ENCOUNTER — Other Ambulatory Visit: Payer: Self-pay | Admitting: Neurological Surgery

## 2023-12-23 DIAGNOSIS — M5416 Radiculopathy, lumbar region: Secondary | ICD-10-CM

## 2023-12-29 ENCOUNTER — Ambulatory Visit
Admission: RE | Admit: 2023-12-29 | Discharge: 2023-12-29 | Disposition: A | Discharge status: Home / Self Care | Payer: PPO | Source: Ambulatory Visit | Attending: Neurological Surgery | Admitting: Neurological Surgery

## 2023-12-29 DIAGNOSIS — M5416 Radiculopathy, lumbar region: Secondary | ICD-10-CM

## 2024-01-28 ENCOUNTER — Other Ambulatory Visit: Payer: Self-pay | Admitting: Neurological Surgery

## 2024-01-31 ENCOUNTER — Telehealth: Payer: Self-pay

## 2024-01-31 ENCOUNTER — Other Ambulatory Visit (HOSPITAL_COMMUNITY): Payer: Self-pay

## 2024-01-31 ENCOUNTER — Telehealth: Payer: Self-pay | Admitting: *Deleted

## 2024-01-31 MED ORDER — ROSUVASTATIN CALCIUM 10 MG PO TABS
5.0000 mg | ORAL_TABLET | ORAL | 3 refills | Status: DC
  Filled 2024-01-31: qty 21, 98d supply, fill #0

## 2024-01-31 NOTE — Telephone Encounter (Signed)
Patient has been scheduled for telephone appt

## 2024-01-31 NOTE — Telephone Encounter (Signed)
Patient has been scheduled for telephone appt med rec and consent done     Patient Consent for Virtual Visit         Lori Frost has provided verbal consent on 01/31/2024 for a virtual visit (video or telephone).   CONSENT FOR VIRTUAL VISIT FOR:  Lori Frost  By participating in this virtual visit I agree to the following:  I hereby voluntarily request, consent and authorize [facility] and its employed or contracted physicians, physician assistants, nurse practitioners or other licensed health care professionals (the Practitioner), to provide me with telemedicine health care services (the "Services") as deemed necessary by the treating Practitioner. I acknowledge and consent to receive the Services by the Practitioner via telemedicine. I understand that the telemedicine visit will involve communicating with the Practitioner through live audiovisual communication technology and the disclosure of certain medical information by electronic transmission. I acknowledge that I have been given the opportunity to request an in-person assessment or other available alternative prior to the telemedicine visit and am voluntarily participating in the telemedicine visit.  I understand that I have the right to withhold or withdraw my consent to the use of telemedicine in the course of my care at any time, without affecting my right to future care or treatment, and that the Practitioner or I may terminate the telemedicine visit at any time. I understand that I have the right to inspect all information obtained and/or recorded in the course of the telemedicine visit and may receive copies of available information for a reasonable fee.  I understand that some of the potential risks of receiving the Services via telemedicine include:  Delay or interruption in medical evaluation due to technological equipment failure or disruption; Information transmitted may not be sufficient (e.g. poor resolution  of images) to allow for appropriate medical decision making by the Practitioner; and/or  In rare instances, security protocols could fail, causing a breach of personal health information.  Furthermore, I acknowledge that it is my responsibility to provide information about my medical history, conditions and care that is complete and accurate to the best of my ability. I acknowledge that Practitioner's advice, recommendations, and/or decision may be based on factors not within their control, such as incomplete or inaccurate data provided by me or distortions of diagnostic images or specimens that may result from electronic transmissions. I understand that the practice of medicine is not an exact science and that Practitioner makes no warranties or guarantees regarding treatment outcomes. I acknowledge that a copy of this consent can be made available to me via my patient portal [facility] MyChart), or I can request a printed copy by calling the office of [facility].    I understand that my insurance will be billed for this visit.   I have read or had this consent read to me. I understand the contents of this consent, which adequately explains the benefits and risks of the Services being provided via telemedicine.  I have been provided ample opportunity to ask questions regarding this consent and the Services and have had my questions answered to my satisfaction. I give my informed consent for the services to be provided through the use of telemedicine in my medical care

## 2024-01-31 NOTE — Telephone Encounter (Signed)
   Name: Lori Frost  DOB: 1946-06-06  MRN: [id number]  Primary Cardiologist: Donato Schultz, MD   Preoperative team, please contact this patient and set up a phone call appointment for further preoperative risk assessment. Please obtain consent and complete medication review. Thank you for your help.  I confirm that guidance regarding antiplatelet and oral anticoagulation therapy has been completed and, if necessary, noted below.  Per office protocol, he may hold aspirin for 7 days prior to procedure and should resume as soon as hemodynamically stable postoperatively.   I also confirmed the patient resides in the state of [place]. As per [facility] Medical Board telemedicine laws, the patient must reside in the state in which the provider is licensed.   Carlos Levering, NP 01/31/2024, 1:12 PM [facility]

## 2024-01-31 NOTE — Telephone Encounter (Signed)
   Pre-operative Risk Assessment    Patient Name: Lori Frost  DOB: [birth year] MRN: [id number]   Date of last office visit: 08/11/23 DR. SKAINS Date of next office visit: NONE   Request for Surgical Clearance    Procedure:   L2-3 LUMBAR FUSION  Date of Surgery:  Clearance 02/18/24                                Surgeon:  DR. Marikay Alar Surgeon's Group or Practice Name:  [facility] Phone number:  [phone] Fax number:  [phone] ATTN: [place] [address]244   Type of Clearance Requested:   - Medical  - Pharmacy:  Hold Aspirin     Type of Anesthesia:  General    Additional requests/questions:    Elpidio Anis   01/31/2024, 10:53 AM

## 2024-02-01 ENCOUNTER — Telehealth: Payer: Self-pay | Admitting: Cardiology

## 2024-02-01 ENCOUNTER — Other Ambulatory Visit (HOSPITAL_COMMUNITY): Payer: Self-pay

## 2024-02-01 ENCOUNTER — Other Ambulatory Visit: Payer: Self-pay

## 2024-02-01 MED ORDER — ROSUVASTATIN CALCIUM 10 MG PO TABS: 5.0000 mg | ORAL_TABLET | ORAL | 1 refills | Status: DC

## 2024-02-01 NOTE — Telephone Encounter (Signed)
 Pt's medication was sent to pt's pharmacy as requested. Confirmation received.

## 2024-02-01 NOTE — Telephone Encounter (Signed)
*  STAT* If patient is at the pharmacy, call can be transferred to refill team.   1. Which medications need to be refilled? (please list name of each medication and dose if known)   rosuvastatin (CRESTOR) 10 MG tablet     4. Which pharmacy/location (including street and city if local pharmacy) is medication to be sent to?  [id number] [place], [place] - [address] Phone: [phone]  Fax: [phone]     [facility]   5. Do they need a 30 day or 90 day supply? 90

## 2024-02-08 NOTE — Progress Notes (Addendum)
 Surgical Instructions    Your procedure is scheduled on February 18, 2024.  Report to [facility] Main Entrance "A" at 5:30 A.M., then check in with the Admitting office.  Call this number if you have problems the morning of surgery:  [phone]  If you have any questions prior to your surgery date call [phone]: Open Monday-Friday 8am-4pm If you experience any cold or flu symptoms such as cough, fever, chills, shortness of breath, etc. between now and your scheduled surgery, please notify us at the above number.     Remember:  Do not eat or drink  after midnight the night before your surgery      Take these medicines the morning of surgery with A SIP OF WATER  ezetimibe (ZETIA)  famotidine (PEPCID)  rosuvastatin (CRESTOR)  trimethoprim (TRIMPEX)   IF NEEDED acetaminophen (TYLENOL)  cetirizine (ZYRTEC)  diphenhydrAMINE (BENADRYL)  fluticasone (FLONASE ALLERGY RELIEF)    Aspirin last dose 02-10-24   As of today, STOP taking any Aspirin (unless otherwise instructed by your surgeon) Aleve, Naproxen, Ibuprofen, Motrin, Advil, Goody's, BC's, all herbal medications, fish oil, and all vitamins.                     Do NOT Smoke (Tobacco/Vaping) for 24 hours prior to your procedure.  If you use a CPAP at night, you may bring your mask/headgear for your overnight stay.   Contacts, glasses, piercing's, hearing aid's, dentures or partials may not be worn into surgery, please bring cases for these belongings.    For patients admitted to the hospital, discharge time will be determined by your treatment team.   Patients discharged the day of surgery will not be allowed to drive home, and someone needs to stay with them for 24 hours.  SURGICAL WAITING ROOM VISITATION Patients having surgery or a procedure may have no more than 2 support people in the waiting area - these visitors may rotate.   Children under the age of 59 must have an adult with them who is not the patient. If the  patient needs to stay at the hospital during part of their recovery, the visitor guidelines for inpatient rooms apply. Pre-op nurse will coordinate an appropriate time for 1 support person to accompany patient in pre-op.  This support person may not rotate.   Please refer to the [facility] website for the visitor guidelines for Inpatients (after your surgery is over and you are in a regular room).    Special instructions:   [place]- Preparing For Surgery  Before surgery, you can play an important role. Because skin is not sterile, your skin needs to be as free of germs as possible. You can reduce the number of germs on your skin by washing with CHG (chlorahexidine gluconate) Soap before surgery.  CHG is an antiseptic cleaner which kills germs and bonds with the skin to continue killing germs even after washing.    Oral Hygiene is also important to reduce your risk of infection.  Remember - BRUSH YOUR TEETH THE MORNING OF SURGERY WITH YOUR REGULAR TOOTHPASTE  Please do not use if you have an allergy to CHG or antibacterial soaps. If your skin becomes reddened/irritated stop using the CHG.  Do not shave (including legs and underarms) for at least 48 hours prior to first CHG shower. It is OK to shave your face.  Please follow these instructions carefully.   Shower the NIGHT BEFORE SURGERY and the MORNING OF SURGERY  If you  chose to wash your hair, wash your hair first as usual with your normal shampoo.  After you shampoo, rinse your hair and body thoroughly to remove the shampoo.  Use CHG Soap as you would any other liquid soap. You can apply CHG directly to the skin and wash gently with a scrungie or a clean washcloth.   Apply the CHG Soap to your body ONLY FROM THE NECK DOWN.  Do not use on open wounds or open sores. Avoid contact with your eyes, ears, mouth and genitals (private parts). Wash Face and genitals (private parts)  with your normal soap.   Wash thoroughly, paying special  attention to the area where your surgery will be performed.  Thoroughly rinse your body with warm water from the neck down.  DO NOT shower/wash with your normal soap after using and rinsing off the CHG Soap.  Pat yourself dry with a CLEAN TOWEL.  Wear CLEAN PAJAMAS to bed the night before surgery  Place CLEAN SHEETS on your bed the night before your surgery  DO NOT SLEEP WITH PETS.   Day of Surgery: Take a shower with CHG soap. Do not wear jewelry or makeup Do not wear lotions, powders, perfumes/colognes, or deodorant. Do not shave 48 hours prior to surgery.  Men may shave face and neck. Do not bring valuables to the hospital.  [facility] is not responsible for any belongings or valuables. Do not wear nail polish, gel polish, artificial nails, or any other type of covering on natural nails (fingers and toes) If you have artificial nails or gel coating that need to be removed by a nail salon, please have this removed prior to surgery. Artificial nails or gel coating may interfere with anesthesia's ability to adequately monitor your vital signs. Wear Clean/Comfortable clothing the morning of surgery Remember to brush your teeth WITH YOUR REGULAR TOOTHPASTE.   Pre-operative 5 CHG Bath Instructions   You can play a key role in reducing the risk of infection after surgery. Your skin needs to be as free of germs as possible. You can reduce the number of germs on your skin by washing with CHG (chlorhexidine gluconate) soap before surgery. CHG is an antiseptic soap that kills germs and continues to kill germs even after washing.   DO NOT use if you have an allergy to chlorhexidine/CHG or antibacterial soaps. If your skin becomes reddened or irritated, stop using the CHG and notify one of our RNs at [phone].   Please shower with the CHG soap starting 4 days before surgery using the following schedule:     Please keep in mind the following:  DO NOT shave, including legs and  underarms, starting the day of your first shower.   You may shave your face at any point before/day of surgery.  Place clean sheets on your bed the day you start using CHG soap. Use a clean washcloth (not used since being washed) for each shower. DO NOT sleep with pets once you start using the CHG.   CHG Shower Instructions:  If you choose to wash your hair and private area, wash first with your normal shampoo/soap.  After you use shampoo/soap, rinse your hair and body thoroughly to remove shampoo/soap residue.  Turn the water OFF and apply about 3 tablespoons (45 ml) of CHG soap to a CLEAN washcloth.  Apply CHG soap ONLY FROM YOUR NECK DOWN TO YOUR TOES (washing for 3-5 minutes)  DO NOT use CHG soap on face, private areas, open wounds,  or sores.  Pay special attention to the area where your surgery is being performed.  If you are having back surgery, having someone wash your back for you may be helpful. Wait 2 minutes after CHG soap is applied, then you may rinse off the CHG soap.  Pat dry with a clean towel  Put on clean clothes/pajamas   If you choose to wear lotion, please use ONLY the CHG-compatible lotions on the back of this paper.     Additional instructions for the day of surgery: DO NOT APPLY any lotions, deodorants, cologne, or perfumes.   Put on clean/comfortable clothes.  Brush your teeth.  Ask your nurse before applying any prescription medications to the skin.      CHG Compatible Lotions   Aveeno Moisturizing lotion  Cetaphil Moisturizing Cream  Cetaphil Moisturizing Lotion  Clairol Herbal Essence Moisturizing Lotion, Dry Skin  Clairol Herbal Essence Moisturizing Lotion, Extra Dry Skin  Clairol Herbal Essence Moisturizing Lotion, Normal Skin  Curel Age Defying Therapeutic Moisturizing Lotion with Alpha Hydroxy  Curel Extreme Care Body Lotion  Curel Soothing Hands Moisturizing Hand Lotion  Curel Therapeutic Moisturizing Cream, Fragrance-Free  Curel Therapeutic  Moisturizing Lotion, Fragrance-Free  Curel Therapeutic Moisturizing Lotion, Original Formula  Eucerin Daily Replenishing Lotion  Eucerin Dry Skin Therapy Plus Alpha Hydroxy Crme  Eucerin Dry Skin Therapy Plus Alpha Hydroxy Lotion  Eucerin Original Crme  Eucerin Original Lotion  Eucerin Plus Crme Eucerin Plus Lotion  Eucerin TriLipid Replenishing Lotion  Keri Anti-Bacterial Hand Lotion  Keri Deep Conditioning Original Lotion Dry Skin Formula Softly Scented  Keri Deep Conditioning Original Lotion, Fragrance Free Sensitive Skin Formula  Keri Lotion Fast Absorbing Fragrance Free Sensitive Skin Formula  Keri Lotion Fast Absorbing Softly Scented Dry Skin Formula  Keri Original Lotion  Keri Skin Renewal Lotion Keri Silky Smooth Lotion  Keri Silky Smooth Sensitive Skin Lotion  Nivea Body Creamy Conditioning Oil  Nivea Body Extra Enriched Lotion  Nivea Body Original Lotion  Nivea Body Sheer Moisturizing Lotion Nivea Crme  Nivea Skin Firming Lotion  NutraDerm 30 Skin Lotion  NutraDerm Skin Lotion  NutraDerm Therapeutic Skin Cream  NutraDerm Therapeutic Skin Lotion  ProShield Protective Hand Cream  Provon moisturizing lotion    Please read over the following fact sheets that you were given.    If you received a COVID test during your pre-op visit  it is requested that you wear a mask when out in public, stay away from anyone that may not be feeling well and notify your surgeon if you develop symptoms. If you have been in contact with anyone that has tested positive in the last 10 days please notify you surgeon.

## 2024-02-09 ENCOUNTER — Encounter (HOSPITAL_COMMUNITY): Payer: Self-pay | Admitting: Neurological Surgery

## 2024-02-09 ENCOUNTER — Encounter (HOSPITAL_COMMUNITY)
Admission: RE | Admit: 2024-02-09 | Discharge: 2024-02-09 | Disposition: A | Discharge status: Home / Self Care | Payer: PPO | Source: Ambulatory Visit | Attending: Neurological Surgery | Admitting: Neurological Surgery

## 2024-02-09 ENCOUNTER — Other Ambulatory Visit: Payer: Self-pay

## 2024-02-09 VITALS — BP 128/75 | HR 80 | Temp 98.6°F | Resp 17 | Ht 61.5 in | Wt 108.0 lb

## 2024-02-09 DIAGNOSIS — Z01812 Encounter for preprocedural laboratory examination: Secondary | ICD-10-CM | POA: Insufficient documentation

## 2024-02-09 DIAGNOSIS — Z01818 Encounter for other preprocedural examination: Secondary | ICD-10-CM

## 2024-02-09 DIAGNOSIS — Z79899 Other long term (current) drug therapy: Secondary | ICD-10-CM | POA: Insufficient documentation

## 2024-02-09 LAB — TYPE AND SCREEN
ABO/RH(D): A POS
Antibody Screen: NEGATIVE

## 2024-02-09 LAB — CBC
HCT: 42.7 % (ref 36.0–46.0)
Hemoglobin: 14.1 g/dL (ref 12.0–15.0)
MCH: 30.7 pg (ref 26.0–34.0)
MCHC: 33 g/dL (ref 30.0–36.0)
MCV: 93 fL (ref 80.0–100.0)
Platelets: 251 10*3/uL (ref 150–400)
RBC: 4.59 MIL/uL (ref 3.87–5.11)
RDW: 13.3 % (ref 11.5–15.5)
WBC: 7.5 10*3/uL (ref 4.0–10.5)
nRBC: 0 % (ref 0.0–0.2)

## 2024-02-09 LAB — BASIC METABOLIC PANEL
Anion gap: 8 (ref 5–15)
BUN: 20 mg/dL (ref 8–23)
CO2: 27 mmol/L (ref 22–32)
Calcium: 9.3 mg/dL (ref 8.9–10.3)
Chloride: 102 mmol/L (ref 98–111)
Creatinine, Ser: 0.83 mg/dL (ref 0.44–1.00)
GFR, Estimated: >60 mL/min (ref >60)
Glucose, Bld: 91 mg/dL (ref 70–99)
Potassium: 3.9 mmol/L (ref 3.5–5.1)
Sodium: 137 mmol/L (ref 135–145)

## 2024-02-09 LAB — PROTIME-INR
INR: 1 (ref 0.8–1.2)
Prothrombin Time: 13 s (ref 11.4–15.2)

## 2024-02-09 LAB — SURGICAL PCR SCREEN
MRSA, PCR: NEGATIVE
Staphylococcus aureus: NEGATIVE

## 2024-02-09 NOTE — Progress Notes (Addendum)
 Surgical Instructions    Your procedure is scheduled on  Friday February 18, 2024.  Report to [facility] Main Entrance "A" at 7:45 A.M., then check in with the Admitting office.  Call this number if you have problems the morning of surgery:  [phone]  If you have any questions prior to your surgery date call [phone]: Open Monday-Friday 8am-4pm If you experience any cold or flu symptoms such as cough, fever, chills, shortness of breath, etc. between now and your scheduled surgery, please notify us at the above number.     Remember:  Do not eat or drink  after midnight the night before your surgery     Take these medicines the morning of surgery with A SIP OF WATER  ezetimibe (ZETIA)  famotidine (PEPCID)  rosuvastatin (CRESTOR)  trimethoprim (TRIMPEX)   IF NEEDED acetaminophen (TYLENOL)  cetirizine (ZYRTEC)  diphenhydrAMINE (BENADRYL)  fluticasone (FLONASE ALLERGY RELIEF)    Aspirin last dose 02-10-24   As of today, STOP taking any Aspirin (unless otherwise instructed by your surgeon) Aleve, Naproxen, Ibuprofen, Motrin, Advil, Goody's, BC's, all herbal medications, fish oil, and all vitamins.                     Do NOT Smoke (Tobacco/Vaping) for 24 hours prior to your procedure.  If you use a CPAP at night, you may bring your mask/headgear for your overnight stay.   Contacts, glasses, piercing's, hearing aid's, dentures or partials may not be worn into surgery, please bring cases for these belongings.    For patients admitted to the hospital, discharge time will be determined by your treatment team.   Patients discharged the day of surgery will not be allowed to drive home, and someone needs to stay with them for 24 hours.  SURGICAL WAITING ROOM VISITATION Patients having surgery or a procedure may have no more than 2 support people in the waiting area - these visitors may rotate.   Children under the age of 66 must have an adult with them who is not the patient. If  the patient needs to stay at the hospital during part of their recovery, the visitor guidelines for inpatient rooms apply. Pre-op nurse will coordinate an appropriate time for 1 support person to accompany patient in pre-op.  This support person may not rotate.   Please refer to the [facility] website for the visitor guidelines for Inpatients (after your surgery is over and you are in a regular room).      Pre-operative 5 CHG Bath Instructions   You can play a key role in reducing the risk of infection after surgery. Your skin needs to be as free of germs as possible. You can reduce the number of germs on your skin by washing with CHG (chlorhexidine gluconate) soap before surgery. CHG is an antiseptic soap that kills germs and continues to kill germs even after washing.   DO NOT use if you have an allergy to chlorhexidine/CHG or antibacterial soaps. If your skin becomes reddened or irritated, stop using the CHG and notify one of our RNs at [phone].   Please shower with the CHG soap starting 4 days before surgery using the following schedule:     Please keep in mind the following:  DO NOT shave, including legs and underarms, starting the day of your first shower.   You may shave your face at any point before/day of surgery.  Place clean sheets on your bed the day you start using CHG  soap. Use a clean washcloth (not used since being washed) for each shower. DO NOT sleep with pets once you start using the CHG.   CHG Shower Instructions:  If you choose to wash your hair and private area, wash first with your normal shampoo/soap.  After you use shampoo/soap, rinse your hair and body thoroughly to remove shampoo/soap residue.  Turn the water OFF and apply about 3 tablespoons (45 ml) of CHG soap to a CLEAN washcloth.  Apply CHG soap ONLY FROM YOUR NECK DOWN TO YOUR TOES (washing for 3-5 minutes)  DO NOT use CHG soap on face, private areas, open wounds, or sores.  Pay special attention to  the area where your surgery is being performed.  If you are having back surgery, having someone wash your back for you may be helpful. Wait 2 minutes after CHG soap is applied, then you may rinse off the CHG soap.  Pat dry with a clean towel  Put on clean clothes/pajamas   If you choose to wear lotion, please use ONLY the CHG-compatible lotions on the back of this paper.     Additional instructions for the day of surgery: DO NOT APPLY any lotions, deodorants, cologne, or perfumes.   Put on clean/comfortable clothes.  Brush your teeth.  Ask your nurse before applying any prescription medications to the skin.      CHG Compatible Lotions   Aveeno Moisturizing lotion  Cetaphil Moisturizing Cream  Cetaphil Moisturizing Lotion  Clairol Herbal Essence Moisturizing Lotion, Dry Skin  Clairol Herbal Essence Moisturizing Lotion, Extra Dry Skin  Clairol Herbal Essence Moisturizing Lotion, Normal Skin  Curel Age Defying Therapeutic Moisturizing Lotion with Alpha Hydroxy  Curel Extreme Care Body Lotion  Curel Soothing Hands Moisturizing Hand Lotion  Curel Therapeutic Moisturizing Cream, Fragrance-Free  Curel Therapeutic Moisturizing Lotion, Fragrance-Free  Curel Therapeutic Moisturizing Lotion, Original Formula  Eucerin Daily Replenishing Lotion  Eucerin Dry Skin Therapy Plus Alpha Hydroxy Crme  Eucerin Dry Skin Therapy Plus Alpha Hydroxy Lotion  Eucerin Original Crme  Eucerin Original Lotion  Eucerin Plus Crme Eucerin Plus Lotion  Eucerin TriLipid Replenishing Lotion  Keri Anti-Bacterial Hand Lotion  Keri Deep Conditioning Original Lotion Dry Skin Formula Softly Scented  Keri Deep Conditioning Original Lotion, Fragrance Free Sensitive Skin Formula  Keri Lotion Fast Absorbing Fragrance Free Sensitive Skin Formula  Keri Lotion Fast Absorbing Softly Scented Dry Skin Formula  Keri Original Lotion  Keri Skin Renewal Lotion Keri Silky Smooth Lotion  Keri Silky Smooth Sensitive Skin  Lotion  Nivea Body Creamy Conditioning Oil  Nivea Body Extra Enriched Lotion  Nivea Body Original Lotion  Nivea Body Sheer Moisturizing Lotion Nivea Crme  Nivea Skin Firming Lotion  NutraDerm 30 Skin Lotion  NutraDerm Skin Lotion  NutraDerm Therapeutic Skin Cream  NutraDerm Therapeutic Skin Lotion  ProShield Protective Hand Cream  Provon moisturizing lotion    Please read over the following fact sheets that you were given.    If you received a COVID test during your pre-op visit  it is requested that you wear a mask when out in public, stay away from anyone that may not be feeling well and notify your surgeon if you develop symptoms. If you have been in contact with anyone that has tested positive in the last 10 days please notify you surgeon.

## 2024-02-09 NOTE — Progress Notes (Addendum)
 PCP - Dr. Sigmund Hazel Cardiologist - Dr. Donato Schultz: has video appointment 2/28/20225 at 1440  PPM/ICD - denies Device Orders - na Rep Notified - na  Chest x-ray - na EKG - 08/11/2023 Stress Test - "years ago" ECHO - 08/09/2023 Cardiac Cath -   Sleep Study - denies CPAP - na  Non-diabetic  Blood Thinner Instructions: denies Aspirin Instructions:States last dose 02/10/2024  ERAS Protcol - NPO  Anesthesia review: Yes. Mitral vale regurg, CAD,   Patient denies shortness of breath, fever, cough and chest pain at PAT appointment   All instructions explained to the patient, with a verbal understanding of the material. Patient agrees to go over the instructions while at home for a better understanding. Patient also instructed to self quarantine after being tested for COVID-19. The opportunity to ask questions was provided.

## 2024-02-10 NOTE — Progress Notes (Signed)
 Anesthesia Chart Review:  78 yo female follows with cardiology for hx of asymptomatic moderate to severe mitral regurgitation.  Echo 08/09/2023 showed stable moderate to severe mitral valve regurgitation, normal LV systolic function with EF 60 to 65%, grade 1 DD, normal RV function.  Hx of Meniere's disease with episodes of vertigo and migraines.       TTE 08/09/2023:  1. Left ventricular ejection fraction, by estimation, is 60 to 65%. Left  ventricular ejection fraction by 3D volume is 61 %. The left ventricle has  normal function. The left ventricle has no regional wall motion  abnormalities. Left ventricular diastolic   parameters are consistent with Grade I diastolic dysfunction (impaired  relaxation).   2. Right ventricular systolic function is normal. The right ventricular  size is normal. There is normal pulmonary artery systolic pressure. The  estimated right ventricular systolic pressure is 20.8 mmHg.   3. Left atrial size was moderately dilated.   4. Significant splay of the MR jet is noted. The mitral valve is  degenerative. Moderate to severe mitral valve regurgitation. There is  moderate late systolic prolapse of multiple scallops of the posterior  leaflet of the mitral valve. Moderate mitral  annular calcification.   5. The aortic valve is tricuspid. Aortic valve regurgitation is mild.  Aortic valve sclerosis/calcification is present, without any evidence of  aortic stenosis. Aortic regurgitation PHT measures 432 msec.   6. Aortic dilatation noted. There is mild dilatation of the ascending  aorta, measuring 40 mm.   7. The inferior vena cava is normal in size with greater than 50%  respiratory variability, suggesting right atrial pressure of 3 mmHg.   Comparison(s): Changes from prior study are noted. 07/27/2022: LVEF 55-60%,  mild LVH, moderate to severe MR with posterior leaflet prolapse.

## 2024-02-11 ENCOUNTER — Ambulatory Visit: Payer: PPO | Attending: Internal Medicine | Admitting: Emergency Medicine

## 2024-02-11 DIAGNOSIS — Z01810 Encounter for preprocedural cardiovascular examination: Secondary | ICD-10-CM

## 2024-02-11 NOTE — Progress Notes (Signed)
 Virtual Visit via Telephone Note   Because of Lori Frost co-morbid illnesses, she is at least at moderate risk for complications without adequate follow up.  This format is felt to be most appropriate for this patient at this time.  Due to technical limitations with video connection Web designer), today's appointment will be conducted as an audio only telehealth visit, and Lori Frost verbally agreed to proceed in this manner.   All issues noted in this document were discussed and addressed.  No physical exam could be performed with this format.  Evaluation Performed:  Preoperative cardiovascular risk assessment _____________   Date:  02/11/2024   Patient ID:  Lori Frost, DOB Feb 04, 1946, MRN [id number] Patient Location:  Home Provider location:   Office  Primary Care Provider:  Sigmund Hazel, MD Primary Cardiologist:  Donato Schultz, MD  Chief Complaint / Patient Profile   78 y.o. y/o female with a h/o moderate to severe mitral valve regurgitation, hyperlipidemia, Mnire's disease, carotid artery disease who is pending L2-3 lumbar fusion on 02/18/2024 by Dr. Marikay Alar with [facility] and presents today for telephonic preoperative cardiovascular risk assessment.  History of Present Illness    Lori Frost is a 78 y.o. female who presents via audio/video conferencing for a telehealth visit today.  Pt was last seen in cardiology clinic on 08/11/2023 by Dr. Anne Fu.  At that time Lori Frost was doing well.  The patient is now pending procedure as outlined above. Since her last visit, she denies chest pain, shortness of breath, lower extremity edema, fatigue, palpitations, diaphoresis, weakness, presyncope, syncope, orthopnea, and PND.  Past Medical History    Past Medical History:  Diagnosis Date   Allergy    Non-Allergic Rhinitis   Balance problem due to vestibular dysfunction    Bloating    CAD (coronary artery disease)    Carotid artery  occlusion    Cataract    Chordae tendineae rupture (HCC)    Cyst of ovary, right    Depression    Diverticulosis    DJD (degenerative joint disease)    Eczema    Family history of adverse reaction to anesthesia    SISTER "HEART STOPPED DURING [id number]"1478 UNKNOWN CAUSE -  SHE DIED 10 YRS LATER OF HEART ATTACK   GERD (gastroesophageal reflux disease)    ESOPHAGITIS PRESENCE NOT SPECIFIED   Granuloma annulare    History of hiatal hernia    History of recurrent UTI (urinary tract infection)    Hypercholesterolemia    Hyperlipidemia    Lower back pain    Meniere's disease    Migraine    Mitral valve regurgitation    MODERATE NORMAL, MILD AORTIC REGURGITATION, MILD TRICUSPID REGURGITATION, LVH, GRADE 1 DIASTOLIC DYSFUNCTION, ECHO 2018 SUGGESTS RUPTURED CHORDAE TENDINEAE ( DR. Donnie Aho)   MVP (mitral valve prolapse)    Myalgia    OA (osteoarthritis) of hip 02/12/2016   Osteoporosis    Other nonrheumatic mitral valve disorders    Psoriasis    Pupil dilated    DUE TO EYE DROPS FOR TX OF RT  EYE PROBLEM   Reactive hypoglycemia    Sensitive skin    Shingles    Vertigo    Vestibular neuronitis    Vitamin D deficiency    Past Surgical History:  Procedure Laterality Date   EYE SURGERY Right 02-05-15   Cataract   EYE SURGERY Left 02-21-15   Cataract   TONSILLECTOMY     TOTAL HIP ARTHROPLASTY  Right 02/12/2016   Procedure: RIGHT TOTAL HIP ARTHROPLASTY ANTERIOR APPROACH;  Surgeon: Ollen Gross, MD;  Location: [facility];  Service: Orthopedics;  Laterality: Right;    Allergies  Allergies  Allergen Reactions   Acetazolamide Other (See Comments)    Lethargy and worsened aura symptoms. .   Codeine Nausea Only   Sertraline Hcl     Made her antsy   Statins     Joint pain.    Tape Dermatitis    Prefers paper tape   Topamax [Topiramate]     Lose taste / appetite, lethargy    Tramadol Itching    Tolerates in low doses     Home Medications    Prior to Admission medications    Medication Sig Start Date End Date Taking? Authorizing Provider  acetaminophen (TYLENOL) 500 MG tablet Take 500 mg by mouth every 6 (six) hours as needed for moderate pain (pain score 4-6).    [provider]  aspirin EC 81 MG tablet Take 81 mg by mouth daily. Swallow whole.    [provider]  Calcium Carbonate-Vit D-Min (CALCIUM 600+D3 PLUS MINERALS) 600-800 MG-UNIT TABS Take 1 tablet by mouth 2 (two) times daily.    [provider]  Cholecalciferol (VITAMIN D3) 1000 units CAPS Take 1,000 Units by mouth daily.    [provider]  ezetimibe (ZETIA) 10 MG tablet Take 10 mg by mouth daily.    [provider]  famotidine (PEPCID) 10 MG tablet Take 10 mg by mouth 2 (two) times daily.    [provider]  glucosamine-chondroitin 500-400 MG tablet Take 1 tablet by mouth daily.    [provider]  Multiple Vitamins-Minerals (ZINC PO) Take 10 mg by mouth daily.    [provider]  Probiotic Product (PROBIOTIC PO) Take 1 capsule by mouth daily.    [provider]  rosuvastatin (CRESTOR) 10 MG tablet Take 0.5 tablets (5 mg total) by mouth 3 (three) times a week. 02/02/24   Jake Bathe, MD  triamcinolone ointment (KENALOG) 0.5 % Apply 1 Application topically daily as needed (dermatits of the ears).    [provider]  triamterene-hydrochlorothiazide (MAXZIDE-25) 37.5-25 MG tablet Take 1 tablet by mouth daily. 10/23/15   [provider]  trimethoprim (TRIMPEX) 100 MG tablet Take 100 mg by mouth daily. 10/23/15   [provider]  ULTRAM 50 MG tablet Take 25 mg by mouth daily as needed for severe pain (pain score 7-10). 08/24/23   [provider]  vitamin B-12 (CYANOCOBALAMIN) 500 MCG tablet Take 500 mcg by mouth daily.    [provider]    Physical Exam    Vital Signs:  Lori Frost does not have vital signs available for review today.  Given telephonic nature of communication,  physical exam is limited. AAOx3. NAD. Normal affect.  Speech and respirations are unlabored.  Accessory Clinical Findings    None  Assessment & Plan    1.  Preoperative Cardiovascular Risk Assessment: According to the Revised Cardiac Risk Index (RCRI), her Perioperative Risk of Major Cardiac Event is (%): 0.4. Her Functional Capacity in METs is: 6.05 according to the Duke Activity Status Index (DASI). Therefore, based on ACC/AHA guidelines, patient would be at acceptable risk for the planned procedure without further cardiovascular testing.   The patient was advised that if she develops new symptoms prior to surgery to contact our office to arrange for a follow-up visit, and she verbalized understanding.  Per office protocol, he  may hold aspirin for 7 days prior to procedure and should resume as soon as hemodynamically stable postoperatively.   A copy of this note will be routed to requesting surgeon.  Time:   Today, I have spent 7 minutes with the patient with telehealth technology discussing medical history, symptoms, and management plan.     Denyce Robert, NP  02/11/2024, 8:16 AM

## 2024-02-11 NOTE — Anesthesia Preprocedure Evaluation (Addendum)
 Anesthesia Evaluation  Patient identified by MRN, date of birth, ID band Patient awake    Reviewed: Allergy & Precautions, NPO status , Patient's Chart, lab work & pertinent test results  Airway Mallampati: II  TM Distance: >3 FB Neck ROM: Full    Dental  (+) Dental Advisory Given   Pulmonary neg pulmonary ROS   breath sounds clear to auscultation       Cardiovascular + CAD  + Valvular Problems/Murmurs MR  Rhythm:Regular Rate:Normal     Neuro/Psych  Headaches  Neuromuscular disease    GI/Hepatic Neg liver ROS, hiatal hernia,GERD  ,,  Endo/Other  negative endocrine ROS    Renal/GU negative Renal ROS     Musculoskeletal  (+) Arthritis ,    Abdominal   Peds  Hematology negative hematology ROS (+)   Anesthesia Other Findings   Reproductive/Obstetrics                             Anesthesia Physical Anesthesia Plan  ASA: 2  Anesthesia Plan: General   Post-op Pain Management: Tylenol PO (pre-op)*   Induction: Intravenous  PONV Risk Score and Plan: 3 and Dexamethasone, Ondansetron and Treatment may vary due to age or medical condition  Airway Management Planned: Oral ETT  Additional Equipment: None  Intra-op Plan:   Post-operative Plan: Extubation in OR  Informed Consent: I have reviewed the patients History and Physical, chart, labs and discussed the procedure including the risks, benefits and alternatives for the proposed anesthesia with the patient or authorized representative who has indicated his/her understanding and acceptance.     Dental advisory given  Plan Discussed with: CRNA  Anesthesia Plan Comments: (PAT note by Antionette Poles, PA-C: 78 yo female follows with cardiology for hx of asymptomatic moderate to severe mitral regurgitation.  Echo 08/09/2023 showed stable moderate to severe mitral valve regurgitation, normal LV systolic function with EF 60 to 65%, grade 1 DD,  normal RV function.  Seen by Rise Paganini, NP on 03/10/2024 for preop evaluation.  Per note, "According to the Revised Cardiac Risk Index (RCRI), her Perioperative Risk of Major Cardiac Event is (%): 0.4. Her Functional Capacity in METs is: 6.05 according to the Duke Activity Status Index (DASI). Therefore, based on ACC/AHA guidelines, patient would be at acceptable risk for the planned procedure without further cardiovascular testing. The patient was advised that if she develops new symptoms prior to surgery to contact our office to arrange for a follow-up visit, and she verbalized understanding. Per office protocol, he may hold aspirin for 7 days prior to procedure and should resume as soon as hemodynamically stable postoperatively."  Other pertinent history includes hiatal hernia, GERD, Meniere's disease with episodes of vertigo and migraines.   Preop labs reviewed, WNL.  EKG 08/11/2023: NSR.  Rate 70.  TTE 08/09/2023: 1. Left ventricular ejection fraction, by estimation, is 60 to 65%. Left  ventricular ejection fraction by 3D volume is 61 %. The left ventricle has  normal function. The left ventricle has no regional wall motion  abnormalities. Left ventricular diastolic  parameters are consistent with Grade I diastolic dysfunction (impaired  relaxation).  2. Right ventricular systolic function is normal. The right ventricular  size is normal. There is normal pulmonary artery systolic pressure. The  estimated right ventricular systolic pressure is 20.8 mmHg.  3. Left atrial size was moderately dilated.  4. Significant splay of the MR jet is noted. The mitral valve is  degenerative. Moderate  to severe mitral valve regurgitation. There is  moderate late systolic prolapse of multiple scallops of the posterior  leaflet of the mitral valve. Moderate mitral  annular calcification.  5. The aortic valve is tricuspid. Aortic valve regurgitation is mild.  Aortic valve sclerosis/calcification  is present, without any evidence of  aortic stenosis. Aortic regurgitation PHT measures 432 msec.  6. Aortic dilatation noted. There is mild dilatation of the ascending  aorta, measuring 40 mm.  7. The inferior vena cava is normal in size with greater than 50%  respiratory variability, suggesting right atrial pressure of 3 mmHg.   Comparison(s): Changes from prior study are noted. 07/27/2022: LVEF 55-60%,  mild LVH, moderate to severe MR with posterior leaflet prolapse.    )        Anesthesia Quick Evaluation

## 2024-02-18 ENCOUNTER — Encounter (HOSPITAL_COMMUNITY)
Admission: RE | Disposition: A | Discharge status: Home / Self Care | Payer: Self-pay | Source: Home / Self Care | Attending: Neurological Surgery

## 2024-02-18 ENCOUNTER — Ambulatory Visit (HOSPITAL_COMMUNITY): Payer: Self-pay | Admitting: Physician Assistant

## 2024-02-18 ENCOUNTER — Observation Stay (HOSPITAL_COMMUNITY)
Admission: RE | Admit: 2024-02-18 | Discharge: 2024-02-20 | Disposition: A | Discharge status: Home / Self Care | Payer: PPO | Attending: Neurological Surgery | Admitting: Neurological Surgery

## 2024-02-18 ENCOUNTER — Ambulatory Visit (HOSPITAL_COMMUNITY)

## 2024-02-18 ENCOUNTER — Ambulatory Visit (HOSPITAL_BASED_OUTPATIENT_CLINIC_OR_DEPARTMENT_OTHER): Admitting: Anesthesiology

## 2024-02-18 ENCOUNTER — Encounter (HOSPITAL_COMMUNITY): Payer: Self-pay | Admitting: Neurological Surgery

## 2024-02-18 ENCOUNTER — Other Ambulatory Visit: Payer: Self-pay

## 2024-02-18 DIAGNOSIS — M4316 Spondylolisthesis, lumbar region: Secondary | ICD-10-CM | POA: Diagnosis not present

## 2024-02-18 DIAGNOSIS — M5116 Intervertebral disc disorders with radiculopathy, lumbar region: Secondary | ICD-10-CM | POA: Diagnosis not present

## 2024-02-18 DIAGNOSIS — M5416 Radiculopathy, lumbar region: Secondary | ICD-10-CM | POA: Diagnosis not present

## 2024-02-18 DIAGNOSIS — Z79899 Other long term (current) drug therapy: Secondary | ICD-10-CM | POA: Diagnosis not present

## 2024-02-18 DIAGNOSIS — Z7982 Long term (current) use of aspirin: Secondary | ICD-10-CM | POA: Diagnosis not present

## 2024-02-18 DIAGNOSIS — M48061 Spinal stenosis, lumbar region without neurogenic claudication: Secondary | ICD-10-CM

## 2024-02-18 DIAGNOSIS — Z01818 Encounter for other preprocedural examination: Principal | ICD-10-CM

## 2024-02-18 DIAGNOSIS — I2510 Atherosclerotic heart disease of native coronary artery without angina pectoris: Secondary | ICD-10-CM | POA: Diagnosis not present

## 2024-02-18 DIAGNOSIS — M4186 Other forms of scoliosis, lumbar region: Secondary | ICD-10-CM | POA: Diagnosis not present

## 2024-02-18 DIAGNOSIS — Z96641 Presence of right artificial hip joint: Secondary | ICD-10-CM | POA: Diagnosis not present

## 2024-02-18 DIAGNOSIS — Z0189 Encounter for other specified special examinations: Secondary | ICD-10-CM | POA: Diagnosis not present

## 2024-02-18 DIAGNOSIS — Z981 Arthrodesis status: Secondary | ICD-10-CM

## 2024-02-18 SURGERY — POSTERIOR LUMBAR FUSION 1 LEVEL
Anesthesia: General | Site: Back

## 2024-02-18 MED ORDER — PROPOFOL 10 MG/ML IV BOLUS
INTRAVENOUS | Status: AC
  Filled 2024-02-18: qty 20

## 2024-02-18 MED ORDER — VASHE WOUND IRRIGATION OPTIME
TOPICAL | Status: DC | PRN
  Administered 2024-02-18: 34 [oz_av] via TOPICAL

## 2024-02-18 MED ORDER — ROCURONIUM BROMIDE 10 MG/ML (PF) SYRINGE
PREFILLED_SYRINGE | INTRAVENOUS | Status: DC | PRN
  Administered 2024-02-18: 60 mg via INTRAVENOUS
  Administered 2024-02-18 (×2): 10 mg via INTRAVENOUS

## 2024-02-18 MED ORDER — ASPIRIN 81 MG PO TBEC
81.0000 mg | DELAYED_RELEASE_TABLET | Freq: Every day | ORAL | Status: DC
  Administered 2024-02-19 – 2024-02-20 (×2): 81 mg via ORAL
  Filled 2024-02-18 (×2): qty 1

## 2024-02-18 MED ORDER — PHENYLEPHRINE 80 MCG/ML (10ML) SYRINGE FOR IV PUSH (FOR BLOOD PRESSURE SUPPORT)
PREFILLED_SYRINGE | INTRAVENOUS | Status: DC | PRN
  Administered 2024-02-18 (×3): 160 ug via INTRAVENOUS
  Administered 2024-02-18: 80 ug via INTRAVENOUS
  Administered 2024-02-18: 160 ug via INTRAVENOUS

## 2024-02-18 MED ORDER — BUPIVACAINE HCL (PF) 0.25 % IJ SOLN
INTRAMUSCULAR | Status: AC
  Filled 2024-02-18: qty 30

## 2024-02-18 MED ORDER — ONDANSETRON HCL 4 MG/2ML IJ SOLN
INTRAMUSCULAR | Status: DC | PRN
  Administered 2024-02-18: 4 mg via INTRAVENOUS

## 2024-02-18 MED ORDER — THROMBIN 5000 UNITS EX SOLR
OROMUCOSAL | Status: DC | PRN
  Administered 2024-02-18: 5 mL via TOPICAL

## 2024-02-18 MED ORDER — BUPIVACAINE HCL (PF) 0.25 % IJ SOLN
INTRAMUSCULAR | Status: DC | PRN
  Administered 2024-02-18: 10 mL

## 2024-02-18 MED ORDER — PHENOL 1.4 % MT LIQD: 1.0000 | OROMUCOSAL | Status: DC | PRN

## 2024-02-18 MED ORDER — PHENYLEPHRINE HCL-NACL 20-0.9 MG/250ML-% IV SOLN: INTRAVENOUS | Status: DC | PRN

## 2024-02-18 MED ORDER — FENTANYL CITRATE (PF) 100 MCG/2ML IJ SOLN
INTRAMUSCULAR | Status: AC
Start: 2024-02-18 — End: 2024-02-19
  Filled 2024-02-18: qty 2

## 2024-02-18 MED ORDER — MENTHOL 3 MG MT LOZG: 1.0000 | LOZENGE | OROMUCOSAL | Status: DC | PRN

## 2024-02-18 MED ORDER — LIDOCAINE 2% (20 MG/ML) 5 ML SYRINGE
INTRAMUSCULAR | Status: DC | PRN
  Administered 2024-02-18: 80 mg via INTRAVENOUS

## 2024-02-18 MED ORDER — MORPHINE SULFATE (PF) 2 MG/ML IV SOLN
2.0000 mg | INTRAVENOUS | Status: DC | PRN
  Administered 2024-02-18: 2 mg via INTRAVENOUS
  Filled 2024-02-18: qty 1

## 2024-02-18 MED ORDER — SODIUM CHLORIDE 0.9% FLUSH
3.0000 mL | Freq: Two times a day (BID) | INTRAVENOUS | Status: DC
  Administered 2024-02-18 – 2024-02-20 (×4): 3 mL via INTRAVENOUS

## 2024-02-18 MED ORDER — SODIUM CHLORIDE 0.9% FLUSH: 3.0000 mL | INTRAVENOUS | Status: DC | PRN

## 2024-02-18 MED ORDER — ACETAMINOPHEN 325 MG PO TABS: 650.0000 mg | ORAL_TABLET | ORAL | Status: DC | PRN

## 2024-02-18 MED ORDER — GABAPENTIN 300 MG PO CAPS
300.0000 mg | ORAL_CAPSULE | ORAL | Status: AC
  Administered 2024-02-18: 300 mg via ORAL
  Filled 2024-02-18: qty 1

## 2024-02-18 MED ORDER — FENTANYL CITRATE (PF) 250 MCG/5ML IJ SOLN
INTRAMUSCULAR | Status: AC
  Filled 2024-02-18: qty 5

## 2024-02-18 MED ORDER — CEFAZOLIN SODIUM-DEXTROSE 2-4 GM/100ML-% IV SOLN
2.0000 g | Freq: Three times a day (TID) | INTRAVENOUS | Status: AC
  Administered 2024-02-18 – 2024-02-19 (×2): 2 g via INTRAVENOUS
  Filled 2024-02-18 (×2): qty 100

## 2024-02-18 MED ORDER — CEFAZOLIN SODIUM-DEXTROSE 2-4 GM/100ML-% IV SOLN
2.0000 g | INTRAVENOUS | Status: AC
  Administered 2024-02-18: 2 g via INTRAVENOUS
  Filled 2024-02-18: qty 100

## 2024-02-18 MED ORDER — FENTANYL CITRATE (PF) 100 MCG/2ML IJ SOLN
25.0000 ug | INTRAMUSCULAR | Status: DC | PRN
  Administered 2024-02-18 (×2): 25 ug via INTRAVENOUS
  Administered 2024-02-18 (×2): 50 ug via INTRAVENOUS

## 2024-02-18 MED ORDER — SODIUM CHLORIDE 0.9 % IV SOLN
250.0000 mL | INTRAVENOUS | Status: AC
  Administered 2024-02-18: 250 mL via INTRAVENOUS

## 2024-02-18 MED ORDER — DEXAMETHASONE 4 MG PO TABS
4.0000 mg | ORAL_TABLET | Freq: Four times a day (QID) | ORAL | Status: DC
  Administered 2024-02-19 – 2024-02-20 (×6): 4 mg via ORAL
  Filled 2024-02-18 (×6): qty 1

## 2024-02-18 MED ORDER — THROMBIN 20000 UNITS EX SOLR
CUTANEOUS | Status: DC | PRN
  Administered 2024-02-18: 20 mL via TOPICAL

## 2024-02-18 MED ORDER — LACTATED RINGERS IV SOLN
INTRAVENOUS | Status: DC | PRN
Start: 2024-02-18 — End: 2024-02-18

## 2024-02-18 MED ORDER — METHOCARBAMOL 1000 MG/10ML IJ SOLN: 500.0000 mg | Freq: Four times a day (QID) | INTRAMUSCULAR | Status: DC | PRN

## 2024-02-18 MED ORDER — OXYCODONE HCL 5 MG PO TABS
5.0000 mg | ORAL_TABLET | ORAL | Status: DC | PRN
  Administered 2024-02-18 – 2024-02-20 (×7): 5 mg via ORAL
  Filled 2024-02-18 (×7): qty 1

## 2024-02-18 MED ORDER — ONDANSETRON HCL 4 MG/2ML IJ SOLN
4.0000 mg | Freq: Four times a day (QID) | INTRAMUSCULAR | Status: DC | PRN
  Administered 2024-02-18 – 2024-02-19 (×2): 4 mg via INTRAVENOUS
  Filled 2024-02-18 (×2): qty 2

## 2024-02-18 MED ORDER — ACETAMINOPHEN 500 MG PO TABS
1000.0000 mg | ORAL_TABLET | Freq: Four times a day (QID) | ORAL | Status: AC
  Administered 2024-02-18 – 2024-02-19 (×3): 1000 mg via ORAL
  Filled 2024-02-18 (×3): qty 2

## 2024-02-18 MED ORDER — TRIAMTERENE-HCTZ 37.5-25 MG PO TABS
1.0000 | ORAL_TABLET | Freq: Every day | ORAL | Status: DC
  Administered 2024-02-19 – 2024-02-20 (×2): 1 via ORAL
  Filled 2024-02-18 (×3): qty 1

## 2024-02-18 MED ORDER — HYDROMORPHONE HCL 1 MG/ML IJ SOLN
INTRAMUSCULAR | Status: AC
Start: 2024-02-18 — End: ?
  Filled 2024-02-18: qty 0.5

## 2024-02-18 MED ORDER — SENNA 8.6 MG PO TABS
1.0000 | ORAL_TABLET | Freq: Two times a day (BID) | ORAL | Status: DC
  Administered 2024-02-18 – 2024-02-20 (×4): 8.6 mg via ORAL
  Filled 2024-02-18 (×4): qty 1

## 2024-02-18 MED ORDER — FENTANYL CITRATE (PF) 250 MCG/5ML IJ SOLN
INTRAMUSCULAR | Status: DC | PRN
  Administered 2024-02-18: 50 ug via INTRAVENOUS
  Administered 2024-02-18: 100 ug via INTRAVENOUS
  Administered 2024-02-18 (×2): 50 ug via INTRAVENOUS

## 2024-02-18 MED ORDER — ACETAMINOPHEN 500 MG PO TABS: 1000.0000 mg | ORAL_TABLET | Freq: Once | ORAL | Status: DC

## 2024-02-18 MED ORDER — SODIUM CHLORIDE 0.9% FLUSH
3.0000 mL | Freq: Two times a day (BID) | INTRAVENOUS | Status: DC
  Administered 2024-02-20: 10 mL via INTRAVENOUS

## 2024-02-18 MED ORDER — TRIMETHOPRIM 100 MG PO TABS
100.0000 mg | ORAL_TABLET | Freq: Every day | ORAL | Status: DC
  Administered 2024-02-19 – 2024-02-20 (×2): 100 mg via ORAL
  Filled 2024-02-18 (×2): qty 1

## 2024-02-18 MED ORDER — THROMBIN 20000 UNITS EX SOLR
CUTANEOUS | Status: AC
  Filled 2024-02-18: qty 20000

## 2024-02-18 MED ORDER — EZETIMIBE 10 MG PO TABS
10.0000 mg | ORAL_TABLET | Freq: Every day | ORAL | Status: DC
  Administered 2024-02-19 – 2024-02-20 (×2): 10 mg via ORAL
  Filled 2024-02-18 (×2): qty 1

## 2024-02-18 MED ORDER — PROPOFOL 10 MG/ML IV BOLUS
INTRAVENOUS | Status: DC | PRN
  Administered 2024-02-18: 120 mg via INTRAVENOUS

## 2024-02-18 MED ORDER — AMISULPRIDE (ANTIEMETIC) 5 MG/2ML IV SOLN: 10.0000 mg | Freq: Once | INTRAVENOUS | Status: DC | PRN

## 2024-02-18 MED ORDER — 0.9 % SODIUM CHLORIDE (POUR BTL) OPTIME
TOPICAL | Status: DC | PRN
  Administered 2024-02-18: 1000 mL

## 2024-02-18 MED ORDER — DEXAMETHASONE SODIUM PHOSPHATE 4 MG/ML IJ SOLN
4.0000 mg | Freq: Four times a day (QID) | INTRAMUSCULAR | Status: DC
  Administered 2024-02-18 – 2024-02-19 (×3): 4 mg via INTRAVENOUS
  Filled 2024-02-18 (×4): qty 1

## 2024-02-18 MED ORDER — ONDANSETRON HCL 4 MG PO TABS: 4.0000 mg | ORAL_TABLET | Freq: Four times a day (QID) | ORAL | Status: DC | PRN

## 2024-02-18 MED ORDER — FAMOTIDINE 20 MG PO TABS
10.0000 mg | ORAL_TABLET | Freq: Two times a day (BID) | ORAL | Status: DC
Start: 2024-02-18 — End: 2024-02-20
  Administered 2024-02-18 – 2024-02-20 (×4): 10 mg via ORAL
  Filled 2024-02-18 (×5): qty 1

## 2024-02-18 MED ORDER — ORAL CARE MOUTH RINSE: 15.0000 mL | Freq: Once | OROMUCOSAL | Status: AC

## 2024-02-18 MED ORDER — CHLORHEXIDINE GLUCONATE CLOTH 2 % EX PADS: 6.0000 | MEDICATED_PAD | Freq: Once | CUTANEOUS | Status: DC

## 2024-02-18 MED ORDER — METHOCARBAMOL 500 MG PO TABS
500.0000 mg | ORAL_TABLET | Freq: Four times a day (QID) | ORAL | Status: DC | PRN
  Administered 2024-02-18 – 2024-02-19 (×3): 500 mg via ORAL
  Filled 2024-02-18 (×3): qty 1

## 2024-02-18 MED ORDER — SUGAMMADEX SODIUM 200 MG/2ML IV SOLN
INTRAVENOUS | Status: DC | PRN
  Administered 2024-02-18: 200 mg via INTRAVENOUS

## 2024-02-18 MED ORDER — CHLORHEXIDINE GLUCONATE 0.12 % MT SOLN
15.0000 mL | Freq: Once | OROMUCOSAL | Status: AC
  Administered 2024-02-18: 15 mL via OROMUCOSAL
  Filled 2024-02-18: qty 15

## 2024-02-18 MED ORDER — DEXAMETHASONE SODIUM PHOSPHATE 10 MG/ML IJ SOLN
INTRAMUSCULAR | Status: DC | PRN
  Administered 2024-02-18: 4 mg via INTRAVENOUS

## 2024-02-18 MED ORDER — ACETAMINOPHEN 500 MG PO TABS
1000.0000 mg | ORAL_TABLET | ORAL | Status: AC
  Administered 2024-02-18: 1000 mg via ORAL
  Filled 2024-02-18: qty 2

## 2024-02-18 MED ORDER — THROMBIN 5000 UNITS EX KIT
PACK | CUTANEOUS | Status: AC
  Filled 2024-02-18: qty 1

## 2024-02-18 MED ORDER — PHENYLEPHRINE HCL-NACL 20-0.9 MG/250ML-% IV SOLN
INTRAVENOUS | Status: DC | PRN
  Administered 2024-02-18: 30 ug/min via INTRAVENOUS

## 2024-02-18 MED ORDER — ACETAMINOPHEN 650 MG RE SUPP: 650.0000 mg | RECTAL | Status: DC | PRN

## 2024-02-18 SURGICAL SUPPLY — 56 items
ALLOGRAFT BONE FIBER KORE 5 (Bone Implant) IMPLANT
BAG COUNTER SPONGE SURGICOUNT (BAG) ×1 IMPLANT
BASKET BONE COLLECTION (BASKET) ×1 IMPLANT
BENZOIN TINCTURE PRP APPL 2/3 (GAUZE/BANDAGES/DRESSINGS) ×1 IMPLANT
BLADE BONE MILL MEDIUM (MISCELLANEOUS) ×1 IMPLANT
BLADE CLIPPER SURG (BLADE) IMPLANT
BUR CARBIDE MATCH 3.0 (BURR) ×1 IMPLANT
CANISTER SUCT 3000ML PPV (MISCELLANEOUS) ×1 IMPLANT
CLEANSER WND VASHE 34 (WOUND CARE) ×1 IMPLANT
CNTNR URN SCR LID CUP LEK RST (MISCELLANEOUS) ×1 IMPLANT
COVER BACK TABLE 60X90IN (DRAPES) ×1 IMPLANT
DERMABOND ADVANCED .7 DNX12 (GAUZE/BANDAGES/DRESSINGS) ×1 IMPLANT
DRAPE C-ARM 42X72 X-RAY (DRAPES) ×2 IMPLANT
DRAPE C-ARMOR (DRAPES) ×1 IMPLANT
DRAPE LAPAROTOMY 100X72X124 (DRAPES) ×1 IMPLANT
DRAPE SURG 17X23 STRL (DRAPES) ×1 IMPLANT
DRSG OPSITE POSTOP 4X6 (GAUZE/BANDAGES/DRESSINGS) IMPLANT
DURAPREP 26ML APPLICATOR (WOUND CARE) ×1 IMPLANT
ELECT REM PT RETURN 9FT ADLT (ELECTROSURGICAL) ×1 IMPLANT
ELECTRODE REM PT RTRN 9FT ADLT (ELECTROSURGICAL) ×1 IMPLANT
EVACUATOR 1/8 PVC DRAIN (DRAIN) ×1 IMPLANT
GAUZE 4X4 16PLY ~~LOC~~+RFID DBL (SPONGE) IMPLANT
GLOVE BIO SURGEON STRL SZ7 (GLOVE) IMPLANT
GLOVE BIO SURGEON STRL SZ8 (GLOVE) ×2 IMPLANT
GLOVE BIOGEL PI IND STRL 7.0 (GLOVE) IMPLANT
GOWN STRL REUS W/ TWL LRG LVL3 (GOWN DISPOSABLE) IMPLANT
GOWN STRL REUS W/ TWL XL LVL3 (GOWN DISPOSABLE) ×2 IMPLANT
GOWN STRL REUS W/TWL 2XL LVL3 (GOWN DISPOSABLE) IMPLANT
HEMOSTAT POWDER KIT SURGIFOAM (HEMOSTASIS) ×1 IMPLANT
KIT BASIN OR (CUSTOM PROCEDURE TRAY) ×1 IMPLANT
KIT INFUSE XX SMALL 0.7CC (Orthopedic Implant) IMPLANT
KIT TURNOVER KIT B (KITS) ×1 IMPLANT
MILL BONE PREP (MISCELLANEOUS) ×1 IMPLANT
NDL HYPO 25X1 1.5 SAFETY (NEEDLE) ×1 IMPLANT
NEEDLE HYPO 25X1 1.5 SAFETY (NEEDLE) ×1 IMPLANT
NS IRRIG 1000ML POUR BTL (IV SOLUTION) ×1 IMPLANT
PACK LAMINECTOMY NEURO (CUSTOM PROCEDURE TRAY) ×1 IMPLANT
PAD ARMBOARD 7.5X6 YLW CONV (MISCELLANEOUS) ×3 IMPLANT
ROD LORD LIPPED TI 5.5X40 (Rod) IMPLANT
ROD LORD LIPPED TI 5.5X45 (Rod) IMPLANT
SCREW CANC SHANK 5X35 (Screw) IMPLANT
SCREW CANC SHANK MOD 5.5X35 (Screw) IMPLANT
SCREW ILIAC PA 5.5X40 (Screw) IMPLANT
SCREW POLYAXIAL TULIP (Screw) IMPLANT
SET SCREW SPNE (Screw) IMPLANT
SPACER IDENTITI 9X7X25 5D (Spacer) IMPLANT
SPACER IDENTITI PS 5D 8X9X25 (Spacer) IMPLANT
SPONGE SURGIFOAM ABS GEL 100 (HEMOSTASIS) ×1 IMPLANT
SPONGE T-LAP 4X18 ~~LOC~~+RFID (SPONGE) IMPLANT
STRIP CLOSURE SKIN 1/2X4 (GAUZE/BANDAGES/DRESSINGS) ×1 IMPLANT
SUT VIC AB 0 CT1 18XCR BRD8 (SUTURE) ×1 IMPLANT
SUT VIC AB 2-0 CP2 18 (SUTURE) ×1 IMPLANT
SUT VIC AB 3-0 SH 8-18 (SUTURE) ×2 IMPLANT
TOWEL GREEN STERILE (TOWEL DISPOSABLE) ×1 IMPLANT
TOWEL GREEN STERILE FF (TOWEL DISPOSABLE) ×1 IMPLANT
WATER STERILE IRR 1000ML POUR (IV SOLUTION) ×1 IMPLANT

## 2024-02-18 NOTE — Anesthesia Procedure Notes (Signed)
 Procedure Name: Intubation Date/Time: 02/18/2024 10:12 AM  Performed by: Sandie Ano, CRNAPre-anesthesia Checklist: Patient identified, Emergency Drugs available, Suction available and Patient being monitored Patient Re-evaluated:Patient Re-evaluated prior to induction Oxygen Delivery Method: Circle System Utilized Preoxygenation: Pre-oxygenation with 100% oxygen Induction Type: IV induction Ventilation: Mask ventilation without difficulty Laryngoscope Size: Mac and 3 Grade View: Grade II Tube type: Oral Tube size: 7.0 mm Number of attempts: 1 Airway Equipment and Method: Stylet and Oral airway Placement Confirmation: ETT inserted through vocal cords under direct vision, positive ETCO2 and breath sounds checked- equal and bilateral Secured at: 22 cm Tube secured with: Tape Dental Injury: Teeth and Oropharynx as per pre-operative assessment

## 2024-02-18 NOTE — Transfer of Care (Signed)
 Immediate Anesthesia Transfer of Care Note  Patient: Lori Frost  Procedure(s) Performed: Posterior Lumbar Interbody Fusion - Lumbar Two-Lumbar Three - Posterior Lateral and Interbody fusion (Back)  Patient Location: PACU  Anesthesia Type:General  Level of Consciousness: awake, alert , and oriented  Airway & Oxygen Therapy: Patient Spontanous Breathing and Patient connected to nasal cannula oxygen  Post-op Assessment: Report given to RN and Post -op Vital signs reviewed and stable  Post vital signs: Reviewed and stable  Last Vitals:  Vitals Value Taken Time  BP 104/44 02/18/24 1223  Temp 36.9 C 02/18/24 1223  Pulse 70 02/18/24 1227  Resp 16 02/18/24 1227  SpO2 100 % 02/18/24 1227  Vitals shown include unfiled device data.  Last Pain:  Vitals:   02/18/24 0819  TempSrc:   PainSc: 2       Patients Stated Pain Goal: 4 (02/18/24 0817)  Complications: No notable events documented.

## 2024-02-18 NOTE — H&P (Signed)
 Subjective: Patient is a 78 y.o. female admitted for PLIF L2-3. Onset of symptoms was a few years ago, gradually worsening since that time.  The pain is rated intense, and is located at the across the lower back and radiates to RLE. The pain is described as aching and occurs all day. The symptoms have been progressive. Symptoms are exacerbated by exercise, flexion, and twisting. MRI or CT showed retrolisthesis and DDD L2-3   Past Medical History:  Diagnosis Date   Allergy    Non-Allergic Rhinitis   Balance problem due to vestibular dysfunction    Bloating    CAD (coronary artery disease)    Carotid artery occlusion    Cataract    Chordae tendineae rupture (HCC)    Cyst of ovary, right    Depression    Diverticulosis    DJD (degenerative joint disease)    Eczema    Family history of adverse reaction to anesthesia    SISTER "HEART STOPPED DURING [id number] UNKNOWN CAUSE -  SHE DIED 10 YRS LATER OF HEART ATTACK   GERD (gastroesophageal reflux disease)    ESOPHAGITIS PRESENCE NOT SPECIFIED   Granuloma annulare    History of hiatal hernia    History of recurrent UTI (urinary tract infection)    Hypercholesterolemia    Hyperlipidemia    Lower back pain    Meniere's disease    Migraine    Mitral valve regurgitation    MODERATE NORMAL, MILD AORTIC REGURGITATION, MILD TRICUSPID REGURGITATION, LVH, GRADE 1 DIASTOLIC DYSFUNCTION, ECHO 2018 SUGGESTS RUPTURED CHORDAE TENDINEAE ( DR. Donnie Aho)   MVP (mitral valve prolapse)    Myalgia    OA (osteoarthritis) of hip 02/12/2016   Osteoporosis    Other nonrheumatic mitral valve disorders    Psoriasis    Pupil dilated    DUE TO EYE DROPS FOR TX OF RT  EYE PROBLEM   Reactive hypoglycemia    Sensitive skin    Shingles    Vertigo    Vestibular neuronitis    Vitamin D deficiency     Past Surgical History:  Procedure Laterality Date   EYE SURGERY Right 02-05-15   Cataract   EYE SURGERY Left 02-21-15   Cataract   TONSILLECTOMY     TOTAL HIP  ARTHROPLASTY Right 02/12/2016   Procedure: RIGHT TOTAL HIP ARTHROPLASTY ANTERIOR APPROACH;  Surgeon: Ollen Gross, MD;  Location: [facility];  Service: Orthopedics;  Laterality: Right;    Prior to Admission medications   Medication Sig Start Date End Date Taking? Authorizing Provider  acetaminophen (TYLENOL) 500 MG tablet Take 500 mg by mouth every 6 (six) hours as needed for moderate pain (pain score 4-6).   Yes [provider]  aspirin EC 81 MG tablet Take 81 mg by mouth daily. Swallow whole.   Yes [provider]  Calcium Carbonate-Vit D-Min (CALCIUM 600+D3 PLUS MINERALS) 600-800 MG-UNIT TABS Take 1 tablet by mouth 2 (two) times daily.   Yes [provider]  Cholecalciferol (VITAMIN D3) 1000 units CAPS Take 1,000 Units by mouth daily.   Yes [provider]  ezetimibe (ZETIA) 10 MG tablet Take 10 mg by mouth daily.   Yes [provider]  famotidine (PEPCID) 10 MG tablet Take 10 mg by mouth 2 (two) times daily.   Yes [provider]  glucosamine-chondroitin 500-400 MG tablet Take 1 tablet by mouth daily.   Yes [provider]  Multiple Vitamins-Minerals (ZINC PO) Take 10 mg by mouth daily.   Yes [provider]  Probiotic Product (PROBIOTIC PO) Take 1 capsule by mouth daily.   Yes [provider]  rosuvastatin (CRESTOR) 10 MG tablet Take 0.5 tablets (5 mg total) by mouth 3 (three) times a week. 02/02/24  Yes Jake Bathe, MD  triamcinolone ointment (KENALOG) 0.5 % Apply 1 Application topically daily as needed (dermatits of the ears).   Yes [provider]  triamterene-hydrochlorothiazide (MAXZIDE-25) 37.5-25 MG tablet Take 1 tablet by mouth daily. 10/23/15  Yes [provider]  trimethoprim (TRIMPEX) 100 MG tablet Take 100 mg by mouth daily. 10/23/15  Yes [provider]  ULTRAM 50 MG tablet Take 25 mg by mouth daily as needed for severe pain (pain score 7-10). 08/24/23  Yes [provider]  vitamin B-12 (CYANOCOBALAMIN) 500 MCG tablet Take 500 mcg by mouth daily.   Yes [provider]   Allergies  Allergen Reactions   Acetazolamide Other (See Comments)    Lethargy and worsened aura symptoms. .   Codeine Nausea Only   Sertraline Hcl     Made her antsy   Statins     Joint pain.    Tape Dermatitis    Prefers paper tape   Topamax [Topiramate]     Lose taste / appetite, lethargy    Tramadol Itching    Tolerates in low doses     Social History   Tobacco Use   Smoking status: Never   Smokeless tobacco: Never  Substance Use Topics   Alcohol use: No    Alcohol/week: 0.0 standard drinks of alcohol    Family History  Problem Relation Age of Onset   Heart attack Mother    Diabetes Mother    Diabetes Father    Heart attack Sister    Cancer Sister    Breast cancer Neg Hx      Review of Systems  Positive ROS: neg  All other systems have been reviewed and were otherwise negative with the exception of those mentioned in the HPI and as above.  Objective: Vital signs in last 24 hours: Temp:  [97.8 F (36.6 C)] 97.8 F (36.6 C) (03/07 0748) Pulse Rate:  [81] 81 (03/07 0748) Resp:  [18] 18 (03/07 0748) BP: (143)/(70) 143/70 (03/07 0748) SpO2:  [97 %] 97 % (03/07 0748) Weight:  [49.4 kg] 49.4 kg (03/07 0748)  General Appearance: Alert, cooperative, no distress, appears stated age Head: Normocephalic, without obvious abnormality, atraumatic Eyes: PERRL, conjunctiva/corneas clear, EOM's intact    Neck: Supple, symmetrical, trachea midline Back: Symmetric, no curvature, ROM normal, no CVA tenderness Lungs:  respirations unlabored Heart: Regular rate and rhythm Abdomen: Soft, non-tender Extremities: Extremities normal, atraumatic, no cyanosis or edema Pulses: 2+ and symmetric all extremities Skin: Skin color, texture, turgor normal, no rashes or lesions  NEUROLOGIC:   Mental status: Alert and oriented x4,  no aphasia, good attention span, fund of  knowledge, and memory Motor Exam - grossly normal Sensory Exam - grossly normal Reflexes: 1+  Coordination - grossly normal Gait - grossly normal Balance - grossly normal Cranial Nerves: I: smell Not tested  II: visual acuity  OS: nl    OD: nl  II: visual fields Full to confrontation  II: pupils Equal, round, reactive to light  III,VII: ptosis None  III,IV,VI: extraocular muscles  Full ROM  V: mastication Normal  V: facial light touch sensation  Normal  V,VII: corneal reflex  Present  VII: facial muscle function - upper  Normal  VII: facial muscle function -  lower Normal  VIII: hearing Not tested  IX: soft palate elevation  Normal  IX,X: gag reflex Present  XI: trapezius strength  5/5  XI: sternocleidomastoid strength 5/5  XI: neck flexion strength  5/5  XII: tongue strength  Normal    Data Review Lab Results  Component Value Date   WBC 7.5 02/09/2024   HGB 14.1 02/09/2024   HCT 42.7 02/09/2024   MCV 93.0 02/09/2024   PLT 251 02/09/2024   Lab Results  Component Value Date   NA 137 02/09/2024   K 3.9 02/09/2024   CL 102 02/09/2024   CO2 27 02/09/2024   BUN 20 02/09/2024   CREATININE 0.83 02/09/2024   GLUCOSE 91 02/09/2024   Lab Results  Component Value Date   INR 1.0 02/09/2024    Assessment/Plan:  Estimated body mass index is 20.26 kg/m as calculated from the following:   Height as of this encounter: 5' 1.5" (1.562 m).   Weight as of this encounter: 49.4 kg. Patient admitted for PLIF L2-3. Patient has failed a reasonable attempt at conservative therapy.  I explained the condition and procedure to the patient and answered any questions.  Patient wishes to proceed with procedure as planned. Understands risks/ benefits and typical outcomes of procedure.   Tia Alert 02/18/2024 9:49 AM

## 2024-02-18 NOTE — Op Note (Signed)
 02/18/2024  12:19 PM  PATIENT:  Lori Frost  78 y.o. female  PRE-OPERATIVE DIAGNOSIS: Retrolisthesis L2-3, severe degenerative disc disease with focal scoliosis L2-3, foraminal stenosis L2-3, back pain with radiculopathy  POST-OPERATIVE DIAGNOSIS:  same  PROCEDURE:   1. Decompressive lumbar laminectomy, hemi facetectomy and foraminotomies L2-3 requiring more work than would be required for a simple exposure of the disk for PLIF in order to adequately decompress the neural elements and address the spinal stenosis 2. Posterior lumbar interbody fusion L2-3 using PTI interbody cages packed with morcellized allograft and autograft  3. Posterior fixation L2-3 using ATEC cortical pedicle screws.  4. Intertransverse arthrodesis L2-3 using morcellized autograft and allograft.  SURGEON:  Marikay Alar, MD  ASSISTANTSAdelene Idler FNP  ANESTHESIA:  General  EBL: 100 ml  Total I/O In: 1100 [I.V.:1000; IV Piggyback:100] Out: 100 [Blood:100]  BLOOD ADMINISTERED:none  DRAINS: none   INDICATION FOR PROCEDURE: This patient presented with pain and right leg pain. Imaging revealed severe spondylosis at L2-3 with degenerative disc disease and focal scoliosis with loss of displaced height on the right with severe foraminal stenosis. The patient tried a reasonable attempt at conservative medical measures without relief. I recommended decompression and instrumented fusion to address the stenosis as well as the segmental  instability.  Patient understood the risks, benefits, and alternatives and potential outcomes and wished to proceed.  PROCEDURE DETAILS:  The patient was brought to the operating room. After induction of generalized endotracheal anesthesia the patient was rolled into the prone position on chest rolls and all pressure points were padded. The patient's lumbar region was cleaned and then prepped with DuraPrep and draped in the usual sterile fashion. Anesthesia was injected and then a dorsal  midline incision was made and carried down to the lumbosacral fascia. The fascia was opened and the paraspinous musculature was taken down in a subperiosteal fashion to expose L2-3. A self-retaining retractor was placed. Intraoperative fluoroscopy confirmed my level, and I started with placement of the L2 cortical pedicle screws. The pedicle screw entry zones were identified utilizing surface landmarks and  AP and lateral fluoroscopy. I scored the cortex with the high-speed drill and then used the hand drill to drill an upward and outward direction into the pedicle. I then tapped line to line. I then placed a 5.5 x 40 mm cortical pedicle screw into the pedicles of L2 on the left and a 5.0 x 40 mm screw on the right.   I then turned my attention to the decompression and complete lumbar laminectomies, hemi- facetectomies, and foraminotomies were performed at L2-3.  My nurse practitioner was directly involved in the decompression and exposure of the neural elements. the patient had significant spinal stenosis and this required more work than would be required for a simple exposure of the disc for posterior lumbar interbody fusion which would only require a limited laminotomy. Much more generous decompression and generous foraminotomy was undertaken in order to adequately decompress the neural elements and address the patient's leg pain. The yellow ligament was removed to expose the underlying dura and nerve roots, and generous foraminotomies were performed to adequately decompress the neural elements. Both the exiting and traversing nerve roots were decompressed on both sides until a coronary dilator passed easily along the nerve roots. Once the decompression was complete, I turned my attention to the posterior lower lumbar interbody fusion. The epidural venous vasculature was coagulated and cut sharply. Disc space was incised and the initial discectomy was performed with pituitary rongeurs.  The disc space was  distracted with sequential distractors to a height of 8 mm. We then used a series of scrapers and shavers to prepare the endplates for fusion. The midline was prepared with Epstein curettes. Once the complete discectomy was finished, we packed an appropriate sized interbody cage with local autograft and morcellized allograft, gently retracted the nerve root, and tapped the cage into position at L2-3.  The midline between the cages was packed with morselized autograft and allograft.   We then turned our attention to the placement of the lower pedicle screws. The pedicle screw entry zones were identified utilizing surface landmarks and fluoroscopy. I drilled into each pedicle utilizing the hand drill, and tapped each pedicle with the appropriate tap. We palpated with a ball probe to assure no break in the cortex. We then placed a 5.5 x 35 mm pedicle screw into the pedicle at L3 on the left and a 5.0 x 35 mm screw on the right at L3.  My nurse practitioner assisted in placement of the pedicle screws.  We then decorticated the transverse processes and laid a mixture of morcellized autograft and allograft out over these to perform intertransverse arthrodesis at 23. We then placed lordotic rods into the multiaxial screw heads of the pedicle screws and locked these in position with the locking caps and anti-torque device. We then checked our construct with AP and lateral fluoroscopy. Irrigated with copious amounts of Vashe solution. Inspected the nerve roots once again to assure adequate decompression, lined to the dura with Gelfoam,  and then we closed the muscle and the fascia with 0 Vicryl. Closed the subcutaneous tissues with 2-0 Vicryl and subcuticular tissues with 3-0 Vicryl. The skin was closed with benzoin and Steri-Strips. Dressing was then applied, the patient was awakened from general anesthesia and transported to the recovery room in stable condition. At the end of the procedure all sponge, needle and  instrument counts were correct.   PLAN OF CARE: admit to inpatient  PATIENT DISPOSITION:  PACU - hemodynamically stable.   Delay start of Pharmacological VTE agent (>24hrs) due to surgical blood loss or risk of bleeding:  yes

## 2024-02-18 NOTE — Anesthesia Postprocedure Evaluation (Signed)
 Anesthesia Post Note  Patient: Lori Frost  Procedure(s) Performed: Posterior Lumbar Interbody Fusion - Lumbar Two-Lumbar Three - Posterior Lateral and Interbody fusion (Back)     Patient location during evaluation: PACU Anesthesia Type: General Level of consciousness: awake and alert Pain management: pain level controlled Vital Signs Assessment: post-procedure vital signs reviewed and stable Respiratory status: spontaneous breathing, nonlabored ventilation, respiratory function stable and patient connected to nasal cannula oxygen Cardiovascular status: blood pressure returned to baseline and stable Postop Assessment: no apparent nausea or vomiting Anesthetic complications: no  No notable events documented.  Last Vitals:  Vitals:   02/18/24 1315 02/18/24 1330  BP: 135/60 (!) 144/69  Pulse: 76 79  Resp: (!) 22 14  Temp:  36.4 C  SpO2: 100% 99%    Last Pain:  Vitals:   02/18/24 1315  TempSrc:   PainSc: Asleep    LLE Motor Response: Purposeful movement (02/18/24 1330) LLE Sensation: Full sensation (02/18/24 1330) RLE Motor Response: Purposeful movement (02/18/24 1330) RLE Sensation: Full sensation (02/18/24 1330)      Kennieth Rad

## 2024-02-18 NOTE — Plan of Care (Signed)
  Problem: Education: Goal: Knowledge of General Education information will improve Description: Including pain rating scale, medication(s)/side effects and non-pharmacologic comfort measures Outcome: Progressing   Problem: Health Behavior/Discharge Planning: Goal: Ability to manage health-related needs will improve Outcome: Progressing   Problem: Clinical Measurements: Goal: Ability to maintain clinical measurements within normal limits will improve Outcome: Progressing Goal: Will remain free from infection Outcome: Progressing Goal: Diagnostic test results will improve Outcome: Progressing Goal: Respiratory complications will improve Outcome: Progressing Goal: Cardiovascular complication will be avoided Outcome: Progressing   Problem: Activity: Goal: Risk for activity intolerance will decrease Outcome: Progressing   Problem: Nutrition: Goal: Adequate nutrition will be maintained Outcome: Progressing   Problem: Pain Managment: Goal: General experience of comfort will improve and/or be controlled Outcome: Progressing   Problem: Skin Integrity: Goal: Risk for impaired skin integrity will decrease Outcome: Progressing

## 2024-02-18 NOTE — Progress Notes (Signed)
 She's got a lot of back pain post-op, no leg pain and has yet to walk. Good strength. Hopefully home in am.

## 2024-02-19 DIAGNOSIS — M4316 Spondylolisthesis, lumbar region: Secondary | ICD-10-CM | POA: Diagnosis not present

## 2024-02-19 NOTE — Progress Notes (Signed)
    Providing Compassionate, Quality Care - Together   NEUROSURGERY PROGRESS NOTE     S: No issues overnight. Pt has arranged for transport to home tmrw.    O: EXAM:  BP (!) 103/48 (BP Location: Right Arm)   Pulse 82   Temp 98.1 F (36.7 C) (Oral)   Resp 19   Ht 5' 1.5" (1.562 m)   Wt 49.4 kg   SpO2 95%   BMI 20.26 kg/m     Awake, alert, oriented  Speech fluent, appropriate  Strength sensation grossly intact BUE/BLE Dressing c/d/I Ambulating without difficulty   ASSESSMENT:  78 y.o. s/p L2-3 PLIF    PLAN: -Continue supportive care -Therapies as tolerated -Anticipate dc to home tmrw.  -Call w/ questions/concerns.   Lori Frost, [facility]

## 2024-02-19 NOTE — Plan of Care (Signed)
   Problem: Activity: Goal: Risk for activity intolerance will decrease Outcome: Progressing   Problem: Coping: Goal: Level of anxiety will decrease Outcome: Progressing   Problem: Safety: Goal: Ability to remain free from injury will improve Outcome: Progressing

## 2024-02-19 NOTE — Care Management Obs Status (Signed)
 MEDICARE OBSERVATION STATUS NOTIFICATION   Patient Details  Name: Lori Frost MRN: [id number] Date of Birth: 05/06/1946   Medicare Observation Status Notification Given:  Yes    Ronny Bacon, RN 02/19/2024, 3:29 PM

## 2024-02-19 NOTE — Evaluation (Signed)
 Occupational Therapy Evaluation Patient Details Name: Lori Frost MRN: [id number] DOB: Oct 29, 1946 Today's Date: 02/19/2024   History of Present Illness   Pt is a 78 y/o F s/p PLIF L2-3 on 3/7. PMH includes allergy, CAD, cataract, depression, HLD, meniere's disease, vertigo, psoriasis     Clinical Impressions Pt reports ind at baseline with ADLs/functional mobility, lives alone but reports at d/c she will be going to her friend's home for 2 days (home set up below is for pt's home). Pt currently needs up to min A for ADLs, supervision for bed mobility and supervision for transfers with RW. Pt educated on brace wear, back precautions, and compensatory strategies for ADLs (handout provided), pt able to verbalize and demo understanding during session. Pt presenting with impairments listed below, will follow acutely. Anticipate no OT follow up needs at d/c.     If plan is discharge home, recommend the following:   A little help with walking and/or transfers;A little help with bathing/dressing/bathroom;Assistance with cooking/housework;Direct supervision/assist for financial management;Direct supervision/assist for medications management;Help with stairs or ramp for entrance;Assist for transportation     Functional Status Assessment   Patient has had a recent decline in their functional status and demonstrates the ability to make significant improvements in function in a reasonable and predictable amount of time.     Equipment Recommendations   BSC/3in1     Recommendations for Other Services   PT consult     Precautions/Restrictions   Precautions Precautions: Back Precaution Booklet Issued: Yes (comment) Recall of Precautions/Restrictions: Intact Precaution/Restrictions Comments: pt educated on 3/3 back precautions Required Braces or Orthoses: Spinal Brace Spinal Brace: Lumbar corset;Applied in sitting position Restrictions Weight Bearing Restrictions Per Provider  Order: No     Mobility Bed Mobility Overal bed mobility: Needs Assistance Bed Mobility: Rolling, Sidelying to Sit, Sit to Sidelying Rolling: Supervision Sidelying to sit: Supervision     Sit to sidelying: Supervision General bed mobility comments: use of log roll technique    Transfers Overall transfer level: Needs assistance Equipment used: Rolling walker (2 wheels) Transfers: Sit to/from Stand Sit to Stand: Supervision                  Balance Overall balance assessment: Needs assistance Sitting-balance support: Feet supported Sitting balance-Leahy Scale: Good     Standing balance support: During functional activity, Reliant on assistive device for balance Standing balance-Leahy Scale: Fair Standing balance comment: RW for ambulation in room                           ADL either performed or assessed with clinical judgement   ADL Overall ADL's : Needs assistance/impaired Eating/Feeding: Supervision/ safety   Grooming: Wash/dry hands;Supervision/safety;Standing   Upper Body Bathing: Minimal assistance   Lower Body Bathing: Minimal assistance   Upper Body Dressing : Supervision/safety   Lower Body Dressing: Contact guard assist   Toilet Transfer: Ambulation;Rolling walker (2 wheels);Regular Toilet;Supervision/safety   Toileting- Clothing Manipulation and Hygiene: Supervision/safety       Functional mobility during ADLs: Supervision/safety;Rolling walker (2 wheels)       Vision   Vision Assessment?: No apparent visual deficits     Perception Perception: Not tested       Praxis Praxis: Not tested       Pertinent Vitals/Pain Pain Assessment Pain Assessment: Faces Pain Score: 3  Faces Pain Scale: Hurts little more Pain Location: back Pain Descriptors / Indicators: Discomfort Pain Intervention(s): Limited activity within patient's  tolerance, Monitored during session, Repositioned     Extremity/Trunk Assessment Upper Extremity  Assessment Upper Extremity Assessment: Generalized weakness   Lower Extremity Assessment Lower Extremity Assessment: Defer to PT evaluation   Cervical / Trunk Assessment Cervical / Trunk Assessment: Back Surgery   Communication Communication Communication: No apparent difficulties   Cognition Arousal: Alert Behavior During Therapy: WFL for tasks assessed/performed Cognition: No apparent impairments                               Following commands: Intact       Cueing  General Comments      VSS on RA   Exercises     Shoulder Instructions      Home Living Family/patient expects to be discharged to:: Private residence Living Arrangements: Alone Available Help at Discharge: Friend(s);Other (Comment) (for 2 days after d/c) Type of Home: House Home Access: Stairs to enter [employer] of Steps: 1   Home Layout: One level     Bathroom Shower/Tub: Tub/shower unit         Home Equipment: Agricultural consultant (2 wheels);Cane - single point (wedge pillow)   Additional Comments: above info is for pt's home setup, pt plans to go to her friend's home at d/c for 2 days, friend lives in a home with 4 step entry      Prior Functioning/Environment Prior Level of Function : Independent/Modified Independent             Mobility Comments: no AD PTA ADLs Comments: ind    OT Problem List: Decreased strength;Decreased range of motion;Decreased activity tolerance;Impaired balance (sitting and/or standing)   OT Treatment/Interventions: Self-care/ADL training;Therapeutic exercise;Energy conservation;DME and/or AE instruction;Therapeutic activities;Patient/family education;Balance training      OT Goals(Current goals can be found in the care plan section)   Acute Rehab OT Goals Patient Stated Goal: none stated OT Goal Formulation: With patient Time For Goal Achievement: 03/04/24 Potential to Achieve Goals: Good ADL Goals Pt Will Perform Lower Body  Dressing: with modified independence;sitting/lateral leans Pt Will Transfer to Toilet: Independently;ambulating;regular height toilet Pt Will Perform Tub/Shower Transfer: Tub transfer;Shower transfer;Independently;ambulating   OT Frequency:  Min 1X/week    Co-evaluation              AM-PAC OT "6 Clicks" Daily Activity     Outcome Measure Help from another person eating meals?: None Help from another person taking care of personal grooming?: None Help from another person toileting, which includes using toliet, bedpan, or urinal?: A Little Help from another person bathing (including washing, rinsing, drying)?: A Little Help from another person to put on and taking off regular upper body clothing?: A Little Help from another person to put on and taking off regular lower body clothing?: A Little 6 Click Score: 20   End of Session Equipment Utilized During Treatment: Back brace;Rolling walker (2 wheels) Nurse Communication: Mobility status  Activity Tolerance: Patient tolerated treatment well Patient left: in bed;with call bell/phone within reach  OT Visit Diagnosis: Unsteadiness on feet (R26.81);Other abnormalities of gait and mobility (R26.89);Muscle weakness (generalized) (M62.81)                Time: 1610-9604 OT Time Calculation (min): 34 min Charges:  OT General Charges $OT Visit: 1 Visit OT Evaluation $OT Eval Low Complexity: 1 Low OT Treatments $Self Care/Home Management : 8-22 mins  Alasdair Kleve K, OTD, OTR/L [employer] [facility]erred Acute Rehab [phone]  Carver Fila Koonce 02/19/2024, 8:30 AM

## 2024-02-19 NOTE — Evaluation (Signed)
 Physical Therapy Evaluation Patient Details Name: Lori Frost MRN: [id number] DOB: 01/29/1946 Today's Date: 02/19/2024  History of Present Illness  Pt is a 78 y/o F s/p PLIF L2-3 on 3/7. PMH includes allergy, CAD, cataract, depression, HLD, meniere's disease, vertigo, psoriasis  Clinical Impression  Patient is s/p above surgery resulting in the deficits listed below (see PT Problem List).  Patient will benefit from acute skilled PT to increase their independence and safety with mobility (while adhering to their precautions) to allow discharge.  Will attempt stairs with pt tomorrow, she was very fatigued with gait today.  Will stay at a friends house for 2 days post-DC.           If plan is discharge home, recommend the following: A little help with walking and/or transfers;Assistance with cooking/housework   Can travel by private vehicle        Equipment Recommendations None recommended by PT  Recommendations for Other Services       Functional Status Assessment Patient has had a recent decline in their functional status and demonstrates the ability to make significant improvements in function in a reasonable and predictable amount of time.     Precautions / Restrictions Precautions Precautions: Back Precaution Booklet Issued: Yes (comment) Recall of Precautions/Restrictions: Intact Precaution/Restrictions Comments: Able to recall 3/3 back precautions Required Braces or Orthoses: Spinal Brace Spinal Brace: Lumbar corset;Applied in sitting position Restrictions Weight Bearing Restrictions Per Provider Order: No      Mobility  Bed Mobility Overal bed mobility: Needs Assistance Bed Mobility: Sit to Sidelying Rolling: Supervision       Sit to sidelying: Supervision General bed mobility comments:  (cues to remmeber to go from sitting to side first then to log roll to back)    Transfers Overall transfer level: Needs assistance Equipment used: Rolling walker (2  wheels) Transfers: Sit to/from Stand Sit to Stand: Supervision           General transfer comment: Able to power up, but painful for pt    Ambulation/Gait Ambulation/Gait assistance: Min assist Gait Distance (Feet): 300 Feet Assistive device: Rolling walker (2 wheels) Gait Pattern/deviations: Step-through pattern Gait velocity: decreased     General Gait Details: pt generally looks staright ahed with limited head turns due to her longstanding vestibular issues.  Pt required several rest breaks during walk and was fatigued before returning to bed.  Stairs            Wheelchair Mobility     Tilt Bed    Modified Rankin (Stroke Patients Only)       Balance Overall balance assessment: Needs assistance         Standing balance support: During functional activity, Reliant on assistive device for balance   Standing balance comment: Pt had an episode of dizziness while standing at side of bed after walking. Required assistance of PT to maintain balnce.  I intervened quickly so unsure if assist had not been present if she would have been able to self-correct                             Pertinent Vitals/Pain Pain Assessment Pain Assessment: 0-10 Pain Score: 5  Pain Location: low back Pain Descriptors / Indicators: Discomfort Pain Intervention(s): Limited activity within patient's tolerance, Monitored during session    Home Living Family/patient expects to be discharged to:: Private residence Living Arrangements: Alone Available Help at Discharge: Friend(s);Other (Comment) Type of Home: House  Home Access: Stairs to enter   Entrance Stairs-Number of Steps: 1   Home Layout: One level Home Equipment: Agricultural consultant (2 wheels);Cane - single point Additional Comments: above info is for pt's home setup, pt plans to go to her friend's home at d/c for 2 days, friend lives in a home with 4 step entry    Prior Function Prior Level of Function :  Independent/Modified Independent             Mobility Comments: no AD PTA ADLs Comments: ind     Extremity/Trunk Assessment   Upper Extremity Assessment Upper Extremity Assessment: Defer to OT evaluation    Lower Extremity Assessment Lower Extremity Assessment: Generalized weakness    Cervical / Trunk Assessment Cervical / Trunk Assessment: Back Surgery  Communication   Communication Communication: Impaired Factors Affecting Communication: Hearing impaired    Cognition Arousal: Alert Behavior During Therapy: WFL for tasks assessed/performed   PT - Cognitive impairments: No apparent impairments                         Following commands: Intact       Cueing       General Comments General comments (skin integrity, edema, etc.): NO family/visitors present. Pt does not feel ready/safe for DC home today as she will have limited assisatnce.    Exercises     Assessment/Plan    PT Assessment Patient needs continued PT services  PT Problem List Decreased strength;Decreased balance;Decreased mobility       PT Treatment Interventions DME instruction;Gait training;Stair training;Patient/family education    PT Goals (Current goals can be found in the Care Plan section)  Acute Rehab PT Goals Patient Stated Goal: To be safer on her feet before going home PT Goal Formulation: With patient Time For Goal Achievement: 02/26/24    Frequency Min 5X/week     Co-evaluation               AM-PAC PT "6 Clicks" Mobility  Outcome Measure Help needed turning from your back to your side while in a flat bed without using bedrails?: None Help needed moving from lying on your back to sitting on the side of a flat bed without using bedrails?: None Help needed moving to and from a bed to a chair (including a wheelchair)?: A Little Help needed standing up from a chair using your arms (e.g., wheelchair or bedside chair)?: A Little Help needed to walk in hospital  room?: A Little Help needed climbing 3-5 steps with a railing? : A Little 6 Click Score: 20    End of Session Equipment Utilized During Treatment: Gait belt;Back brace Activity Tolerance: Patient tolerated treatment well Patient left: in bed;with call bell/phone within reach Nurse Communication: Mobility status PT Visit Diagnosis: Other abnormalities of gait and mobility (R26.89)    Time: 1308-6578 PT Time Calculation (min) (ACUTE ONLY): 26 min   Charges:   PT Evaluation $PT Eval Low Complexity: 1 Low PT Treatments $Gait Training: 8-22 mins PT General Charges $$ ACUTE PT VISIT: 1 Visit         Lavona Mound, PT   Acute Rehabilitation Services  Office [phone] 02/19/2024   Donnella Sham 02/19/2024, 10:14 AM

## 2024-02-20 DIAGNOSIS — M4316 Spondylolisthesis, lumbar region: Secondary | ICD-10-CM | POA: Diagnosis not present

## 2024-02-20 MED ORDER — SENNA 8.6 MG PO TABS: 1.0000 | ORAL_TABLET | Freq: Two times a day (BID) | ORAL | 0 refills | Status: AC

## 2024-02-20 MED ORDER — METHOCARBAMOL 500 MG PO TABS: 500.0000 mg | ORAL_TABLET | Freq: Four times a day (QID) | ORAL | 0 refills | Status: DC | PRN

## 2024-02-20 MED ORDER — OXYCODONE HCL 5 MG PO TABS: 5.0000 mg | ORAL_TABLET | ORAL | 0 refills | Status: DC | PRN

## 2024-02-20 NOTE — TOC Transition Note (Signed)
 Transition of Care [facility]) - Discharge Note   Patient Details  Name: Lori Frost MRN: [id number] Date of Birth: 12-03-1946  Transition of Care [facility]) CM/SW Contact:  Ronny Bacon, RN Phone Number: 02/20/2024, 10:52 AM   Clinical Narrative:  Patient is being discharged today. Spoke with patient by phone, confirmed that she has a rolling walker at home. Patient is agreeable to receiving the 3:1. DME ordered through [facility] with [employer] to be delivered to bedside before discharge.     Final next level of care: Home/Self Care Barriers to Discharge: No Barriers Identified   Patient Goals and CMS Choice            Discharge Placement                       Discharge Plan and Services Additional resources added to the After Visit Summary for                  DME Arranged: 3-N-1 DME Agency: [employer] Date DME Agency Contacted: 02/20/24 Time DME Agency Contacted: 1028 Representative spoke with at DME Agency: Vaughan Basta (@ [phone])            Social Drivers of Health (SDOH) Interventions SDOH Screenings   Food Insecurity: No Food Insecurity (02/19/2024)  Housing: Low Risk  (02/19/2024)  Transportation Needs: No Transportation Needs (02/19/2024)  Utilities: Not At Risk (02/19/2024)  Social Connections: Unknown (02/19/2024)  Tobacco Use: Low Risk  (02/18/2024)     Readmission Risk Interventions     No data to display

## 2024-02-20 NOTE — Progress Notes (Signed)
 Occupational Therapy Treatment Patient Details Name: Lori Frost MRN: [id number] DOB: Jun 10, 1946 Today's Date: 02/20/2024   History of present illness Pt is a 78 y/o F s/p PLIF L2-3 on 3/7. PMH includes allergy, CAD, cataract, depression, HLD, meniere's disease, vertigo, psoriasis   OT comments  Pt progressing toward goals this session, able to verbalize and adhere to back precautions throughout session with min cues. Pt needs up to CGA for ADLs, utilizes compensatory strategies as appropriate. Pt supervision for bed mobility via log roll technique, and supervision for transfers with RW. Reviewed back prec, brace wear, and ADL compensatory strategies, demo'd use of 3in1 as shower seat for home. Pt presenting with impairments listed below, will follow acutely. Continue to anticipate no OT follow up needs at d/c.       If plan is discharge home, recommend the following:  A little help with walking and/or transfers;A little help with bathing/dressing/bathroom;Assistance with cooking/housework;Direct supervision/assist for financial management;Direct supervision/assist for medications management;Help with stairs or ramp for entrance;Assist for transportation   Equipment Recommendations  BSC/3in1    Recommendations for Other Services PT consult    Precautions / Restrictions Precautions Precautions: Back Precaution Booklet Issued: Yes (comment) Recall of Precautions/Restrictions: Intact Precaution/Restrictions Comments: Able to recall 3/3 back precautions Required Braces or Orthoses: Spinal Brace Spinal Brace: Lumbar corset Restrictions Weight Bearing Restrictions Per Provider Order: No       Mobility Bed Mobility Overal bed mobility: Needs Assistance Bed Mobility: Sit to Sidelying Rolling: Supervision Sidelying to sit: Supervision     Sit to sidelying: Supervision General bed mobility comments: via log roll technique    Transfers Overall transfer level: Needs  assistance Equipment used: Rolling walker (2 wheels) Transfers: Sit to/from Stand Sit to Stand: Supervision                 Balance Overall balance assessment: Needs assistance Sitting-balance support: Feet supported Sitting balance-Leahy Scale: Good     Standing balance support: During functional activity, Reliant on assistive device for balance Standing balance-Leahy Scale: Fair Standing balance comment: static stands without assist, RW for ambulation/dynamic tasks                           ADL either performed or assessed with clinical judgement   ADL Overall ADL's : Needs assistance/impaired     Grooming: Wash/dry face;Wash/dry hands;Set up;Standing Grooming Details (indicate cue type and reason): standing at sink Upper Body Bathing: Supervision/ safety;Sitting       Upper Body Dressing : Contact guard assist;Sitting   Lower Body Dressing: Contact guard assist;Sitting/lateral leans   Toilet Transfer: Contact guard assist;Ambulation;Comfort height toilet;Rolling walker (2 wheels)   Toileting- Clothing Manipulation and Hygiene: Supervision/safety       Functional mobility during ADLs: Supervision/safety;Rolling walker (2 wheels)      Extremity/Trunk Assessment Upper Extremity Assessment Upper Extremity Assessment: Generalized weakness   Lower Extremity Assessment Lower Extremity Assessment: Defer to PT evaluation        Vision   Vision Assessment?: No apparent visual deficits   Perception Perception Perception: Not tested   Praxis Praxis Praxis: Not tested   Communication Communication Communication: Impaired Factors Affecting Communication: Hearing impaired   Cognition Arousal: Alert Behavior During Therapy: WFL for tasks assessed/performed Cognition: No apparent impairments                               Following commands: Intact  Cueing      Exercises      Shoulder Instructions       General  Comments VSS    Pertinent Vitals/ Pain       Pain Assessment Pain Assessment: Faces Pain Score: 5  Faces Pain Scale: Hurts little more Pain Location: low back Pain Descriptors / Indicators: Discomfort Pain Intervention(s): Limited activity within patient's tolerance, Monitored during session, Repositioned  Home Living                                          Prior Functioning/Environment              Frequency  Min 1X/week        Progress Toward Goals  OT Goals(current goals can now be found in the care plan section)  Progress towards OT goals: Progressing toward goals  Acute Rehab OT Goals Patient Stated Goal: to go home OT Goal Formulation: With patient Time For Goal Achievement: 03/04/24 Potential to Achieve Goals: Good ADL Goals Pt Will Perform Lower Body Dressing: with modified independence;sitting/lateral leans Pt Will Transfer to Toilet: Independently;ambulating;regular height toilet Pt Will Perform Tub/Shower Transfer: Tub transfer;Shower transfer;Independently;ambulating  Plan      Co-evaluation                 AM-PAC OT "6 Clicks" Daily Activity     Outcome Measure   Help from another person eating meals?: None Help from another person taking care of personal grooming?: None Help from another person toileting, which includes using toliet, bedpan, or urinal?: A Little Help from another person bathing (including washing, rinsing, drying)?: A Little Help from another person to put on and taking off regular upper body clothing?: A Little Help from another person to put on and taking off regular lower body clothing?: A Little 6 Click Score: 20    End of Session Equipment Utilized During Treatment: Back brace;Rolling walker (2 wheels)  OT Visit Diagnosis: Unsteadiness on feet (R26.81);Other abnormalities of gait and mobility (R26.89);Muscle weakness (generalized) (M62.81)   Activity Tolerance Patient tolerated treatment well    Patient Left in chair;with call bell/phone within reach   Nurse Communication Mobility status        Time: 0752-0824 OT Time Calculation (min): 32 min  Charges: OT General Charges $OT Visit: 1 Visit OT Treatments $Self Care/Home Management : 23-37 mins  Carver Fila, OTD, OTR/L SecureChat Preferred Acute Rehab [phone]   Karam Dunson K Koonce 02/20/2024, 9:15 AM

## 2024-02-20 NOTE — Discharge Summary (Signed)
 Patient ID: Lori Frost MRN: [id number] DOB/AGE: 1946-10-20 78 y.o.  Admit date: 02/18/2024 Discharge date: 02/20/2024  Admission Diagnoses: S/P lumbar fusion [Z98.1]   Discharge Diagnoses: Same   Discharged Condition: Stable  Hospital Course:  Lori Frost is a 78 y.o. female who was admitted following an uncomplicated L2-3 PLIF. They were recovered in PACU and transferred to the floor. Hospital course was uncomplicated. Pt stable for discharge today. Pt to f/u in office for routine post op visit. Pt is in agreement w/ plan.    Discharge Exam: Blood pressure 122/79, pulse 74, temperature 98.2 F (36.8 C), resp. rate 16, height 5' 1.5" (1.562 m), weight 49.4 kg, SpO2 97%. A&O Speech fluent, appropriate Strength/sensation grossly intact BUE/BLE.  Dressing c/d/I.   Disposition: Discharge disposition: 01-Home or Self Care        Allergies as of 02/20/2024       Reactions   Acetazolamide Other (See Comments)   Lethargy and worsened aura symptoms. .   Codeine Nausea Only   Sertraline Hcl    Made her antsy   Statins    Joint pain.    Tape Dermatitis   Prefers paper tape   Topamax [topiramate]    Lose taste / appetite, lethargy    Tramadol Itching   Tolerates in low doses         Medication List     PAUSE taking these medications    aspirin EC 81 MG tablet Wait to take this until: February 25, 2024 Take 81 mg by mouth daily. Swallow whole.       STOP taking these medications    Ultram 50 MG tablet Generic drug: traMADol       TAKE these medications    acetaminophen 500 MG tablet Commonly known as: TYLENOL Take 500 mg by mouth every 6 (six) hours as needed for moderate pain (pain score 4-6).   Calcium 600+D3 Plus Minerals 600-800 MG-UNIT Tabs Take 1 tablet by mouth 2 (two) times daily.   ezetimibe 10 MG tablet Commonly known as: ZETIA Take 10 mg by mouth daily.   famotidine 10 MG tablet Commonly known as: PEPCID Take 10 mg by mouth 2  (two) times daily.   glucosamine-chondroitin 500-400 MG tablet Take 1 tablet by mouth daily.   methocarbamol 500 MG tablet Commonly known as: ROBAXIN Take 1 tablet (500 mg total) by mouth every 6 (six) hours as needed for muscle spasms.   oxyCODONE 5 MG immediate release tablet Commonly known as: Oxy IR/ROXICODONE Take 1 tablet (5 mg total) by mouth every 4 (four) hours as needed for moderate pain (pain score 4-6).   PROBIOTIC PO Take 1 capsule by mouth daily.   rosuvastatin 10 MG tablet Commonly known as: CRESTOR Take 0.5 tablets (5 mg total) by mouth 3 (three) times a week.   senna 8.6 MG Tabs tablet Commonly known as: SENOKOT Take 1 tablet (8.6 mg total) by mouth 2 (two) times daily.   triamcinolone ointment 0.5 % Commonly known as: KENALOG Apply 1 Application topically daily as needed (dermatits of the ears).   triamterene-hydrochlorothiazide 37.5-25 MG tablet Commonly known as: MAXZIDE-25 Take 1 tablet by mouth daily.   trimethoprim 100 MG tablet Commonly known as: TRIMPEX Take 100 mg by mouth daily.   vitamin B-12 500 MCG tablet Commonly known as: CYANOCOBALAMIN Take 500 mcg by mouth daily.   Vitamin D3 25 MCG (1000 UT) Caps Take 1,000 Units by mouth daily.   ZINC PO Take 10 mg by  mouth daily.               Durable Medical Equipment  (From admission, onward)           Start     Ordered   02/18/24 1328  DME Walker rolling  Once       Question:  Patient needs a walker to treat with the following condition  Answer:  S/P lumbar fusion   02/18/24 1327   02/18/24 1328  DME 3 n 1  Once        02/18/24 1327             Signed: Lavance [employer] CAYLIN Camy Frost 02/20/2024, 9:58 AM

## 2024-02-20 NOTE — Progress Notes (Signed)
 Physical Therapy Treatment Patient Details Name: Lori Frost MRN: [id number] DOB: 1946-12-11 Today's Date: 02/20/2024   History of Present Illness Pt is a 78 y/o F s/p PLIF L2-3 on 3/7. PMH includes allergy, CAD, cataract, depression, HLD, meniere's disease, vertigo, psoriasis    PT Comments  Continuing work on functional mobility and activity tolerance;  session focused on progressive amb and stair training; Addressed pt's question re: car transfers as well; Overall moving well; OK for dc home from PT standpoint    If plan is discharge home, recommend the following: A little help with walking and/or transfers;Assistance with cooking/housework   Can travel by private vehicle        Equipment Recommendations  None recommended by PT    Recommendations for Other Services       Precautions / Restrictions Precautions Precautions: Back Precaution Booklet Issued: Yes (comment) Recall of Precautions/Restrictions: Intact Precaution/Restrictions Comments: Able to recall 3/3 back precautions Required Braces or Orthoses: Spinal Brace Spinal Brace: Lumbar corset Restrictions Weight Bearing Restrictions Per Provider Order: No     Mobility  Bed Mobility Overal bed mobility: Needs Assistance Bed Mobility: Rolling, Sidelying to Sit Rolling: Supervision Sidelying to sit: Supervision       General bed mobility comments: via log roll technique    Transfers Overall transfer level: Needs assistance Equipment used: Rolling walker (2 wheels) Transfers: Sit to/from Stand Sit to Stand: Supervision           General transfer comment: smooth transition    Ambulation/Gait Ambulation/Gait assistance: Contact guard assist Gait Distance (Feet): 200 Feet Assistive device: Rolling walker (2 wheels) Gait Pattern/deviations: Step-through pattern       General Gait Details: pt generally looks staright ahed with limited head turns due to her longstanding vestibular  issues.   Stairs Stairs: Yes Stairs assistance: Contact guard assist Stair Management: One rail Left, Alternating pattern, Forwards Number of Stairs: 5 General stair comments: slow but steady, with 2 hands on L rail   Wheelchair Mobility     Tilt Bed    Modified Rankin (Stroke Patients Only)       Balance     Sitting balance-Leahy Scale: Good       Standing balance-Leahy Scale: Fair                              Hotel manager: Impaired Factors Affecting Communication: Hearing impaired  Cognition Arousal: Alert Behavior During Therapy: WFL for tasks assessed/performed   PT - Cognitive impairments: No apparent impairments                         Following commands: Intact      Cueing    Exercises      General Comments General comments (skin integrity, edema, etc.): NAD on room air      Pertinent Vitals/Pain Pain Assessment Pain Assessment: Faces Faces Pain Scale: Hurts a little bit Pain Location: low back Pain Descriptors / Indicators: Discomfort Pain Intervention(s): Monitored during session    Home Living                          Prior Function            PT Goals (current goals can now be found in the care plan section) Acute Rehab PT Goals Patient Stated Goal: To be safer on her feet before going home  PT Goal Formulation: With patient Time For Goal Achievement: 02/26/24 Progress towards PT goals: Progressing toward goals    Frequency    Min 5X/week      PT Plan      Co-evaluation              AM-PAC PT "6 Clicks" Mobility   Outcome Measure  Help needed turning from your back to your side while in a flat bed without using bedrails?: None Help needed moving from lying on your back to sitting on the side of a flat bed without using bedrails?: None Help needed moving to and from a bed to a chair (including a wheelchair)?: A Little Help needed standing up from a  chair using your arms (e.g., wheelchair or bedside chair)?: A Little Help needed to walk in hospital room?: A Little Help needed climbing 3-5 steps with a railing? : A Little 6 Click Score: 20    End of Session Equipment Utilized During Treatment: Gait belt;Back brace Activity Tolerance: Patient tolerated treatment well Patient left: in chair;with call bell/phone within reach;with family/visitor present Nurse Communication: Mobility status PT Visit Diagnosis: Other abnormalities of gait and mobility (R26.89)     Time: 1610-9604 PT Time Calculation (min) (ACUTE ONLY): 16 min  Charges:    $Gait Training: 8-22 mins PT General Charges $$ ACUTE PT VISIT: 1 Visit                     Van Clines, PT  Acute Rehabilitation Services Office [phone] Secure Chat welcomed    Levi Aland 02/20/2024, 12:52 PM

## 2024-03-01 DIAGNOSIS — E7800 Pure hypercholesterolemia, unspecified: Secondary | ICD-10-CM | POA: Diagnosis not present

## 2024-03-01 DIAGNOSIS — H8103 Meniere's disease, bilateral: Secondary | ICD-10-CM | POA: Diagnosis not present

## 2024-03-01 DIAGNOSIS — Z981 Arthrodesis status: Secondary | ICD-10-CM | POA: Diagnosis not present

## 2024-03-11 ENCOUNTER — Other Ambulatory Visit: Payer: Self-pay | Admitting: Cardiology

## 2024-03-13 ENCOUNTER — Telehealth: Payer: Self-pay | Admitting: Cardiology

## 2024-03-13 DIAGNOSIS — I340 Nonrheumatic mitral (valve) insufficiency: Secondary | ICD-10-CM

## 2024-03-13 NOTE — Telephone Encounter (Signed)
 Order placed to echocardiogram prior to 08/15/2024 f/u appt with Dr Anne Fu

## 2024-03-13 NOTE — Telephone Encounter (Signed)
 Patient is requesting an order to have an echo prior to 9/02 appointment with Dr. Anne Fu.

## 2024-03-19 NOTE — Therapy (Signed)
 OUTPATIENT PHYSICAL THERAPY THORACOLUMBAR EVALUATION   Patient Name: Lori Frost MRN: [id number] DOB:03/11/1946, 78 y.o., female Today's Date: 03/20/2024  END OF SESSION:  PT End of Session - 03/20/24 1720     Visit Number 1    Date for PT Re-Evaluation 05/15/24    Authorization Type HTA    Progress Note Due on Visit 10    PT Start Time 1145    PT Stop Time 1228    PT Time Calculation (min) 43 min    Activity Tolerance Patient tolerated treatment well    Behavior During Therapy WFL for tasks assessed/performed             Past Medical History:  Diagnosis Date   Allergy    Non-Allergic Rhinitis   Balance problem due to vestibular dysfunction    Bloating    CAD (coronary artery disease)    Carotid artery occlusion    Cataract    Chordae tendineae rupture (HCC)    Cyst of ovary, right    Depression    Diverticulosis    DJD (degenerative joint disease)    Eczema    Family history of adverse reaction to anesthesia    SISTER "HEART STOPPED DURING NWGNFAO"1308 UNKNOWN CAUSE -  SHE DIED 10 YRS LATER OF HEART ATTACK   GERD (gastroesophageal reflux disease)    ESOPHAGITIS PRESENCE NOT SPECIFIED   Granuloma annulare    History of hiatal hernia    History of recurrent UTI (urinary tract infection)    Hypercholesterolemia    Hyperlipidemia    Lower back pain    Meniere's disease    Migraine    Mitral valve regurgitation    MODERATE NORMAL, MILD AORTIC REGURGITATION, MILD TRICUSPID REGURGITATION, LVH, GRADE 1 DIASTOLIC DYSFUNCTION, ECHO 2018 SUGGESTS RUPTURED CHORDAE TENDINEAE ( DR. Donnie Aho)   MVP (mitral valve prolapse)    Myalgia    OA (osteoarthritis) of hip 02/12/2016   Osteoporosis    Other nonrheumatic mitral valve disorders    Psoriasis    Pupil dilated    DUE TO EYE DROPS FOR TX OF RT  EYE PROBLEM   Reactive hypoglycemia    Sensitive skin    Shingles    Vertigo    Vestibular neuronitis    Vitamin D deficiency    Past Surgical History:  Procedure  Laterality Date   EYE SURGERY Right 02-05-15   Cataract   EYE SURGERY Left 02-21-15   Cataract   TONSILLECTOMY     TOTAL HIP ARTHROPLASTY Right 02/12/2016   Procedure: RIGHT TOTAL HIP ARTHROPLASTY ANTERIOR APPROACH;  Surgeon: Ollen Gross, MD;  Location: [facility];  Service: Orthopedics;  Laterality: Right;   Patient Active Problem List   Diagnosis Date Noted   S/P lumbar fusion 02/18/2024   Mitral valve regurgitation 08/14/2021   Coronary artery calcification 08/14/2021   Aortic atherosclerosis (HCC) 08/14/2021   Hyperlipidemia 08/14/2021   Carotid artery disease (HCC) 08/14/2021   OA (osteoarthritis) of hip 02/12/2016    PCP: Bluford Main, MD  REFERRING PROVIDER: Arman Bogus, MD  REFERRING DIAG: M54.16 (ICD-10-CM) - Radiculopathy, lumbar region  Rationale for Evaluation and Treatment: Rehabilitation  THERAPY DIAG:  Radiculopathy, lumbar region - Plan: PT plan of care cert/re-cert  Muscle weakness (generalized) - Plan: PT plan of care cert/re-cert  Unsteadiness on feet - Plan: PT plan of care cert/re-cert  Dizziness and giddiness - Plan: PT plan of care cert/re-cert  Other abnormalities of gait and mobility - Plan: PT plan of care cert/re-cert  ONSET DATE: Surgery Date 02/18/2024  SUBJECTIVE:                                                                                                                                                                                           SUBJECTIVE STATEMENT: Patient presents s/p L2/L3 lumbar fusion on 02/18/2024. She has occasional back pain usually around 1-3pm, or if she moves the wrong way. She presents with a LSO brace that she wears all day except when she is laying down or laying with her wedge. Patient was able to verbalize her spinal precautions. She has a small dog that she cares for and sometimes has to clean up after it. She has had a history of back pain and that progressively got worse over the last few months. Patient also  reports having Meniere's disease and she went to [facility] clinic for treatment but she feels get balance is limiting her walking tolerance and confidence.  PERTINENT HISTORY:  Depression; osteoporosis; vertigo; Meniere's disease; hx Rt total hip arthroplasty ; scoliosis   PAIN:  Are you having pain? Yes: NPRS scale: 2(currently) 3-4 (worst)/ 10  Pain location: at incision site Pain description: throbbing; burning; sore Aggravating factors: continuous movement; reaching in cabinets; walking in grocery store  Relieving factors: laying down  PRECAUTIONS: Back lumbar fusion 3/7/225. Lifting < 10 lbs  RED FLAGS: None   WEIGHT BEARING RESTRICTIONS: No  FALLS:  Has patient fallen in last 6 months? No  LIVING ENVIRONMENT: Lives with: lives alone Lives in: House/apartment Stairs: Yes: Internal: 1  steps; none Has following equipment at home: Single point cane  OCCUPATION: Retired  PLOF: Independent, Independent with basic ADLs, Independent with household mobility without device, and Independent with gait  PATIENT GOALS: Return to activities she does at her church; Return to [employer]; shop without issues;   NEXT MD VISIT: April 06, 2024  OBJECTIVE:  Note: Objective measures were completed at Evaluation unless otherwise noted.  DIAGNOSTIC FINDINGS:  12/29/2023 Lumbar CT without contrast  IMPRESSION: 1. Transitional lumbosacral anatomy. Designating normal lumbar segmentation results in hypoplastic ribs at L1. Correlation with radiographs is recommended prior to any operative intervention. 2. Levoconvex lumbar scoliosis with degenerative spondylolisthesis at L2-L3, L5-S1. Advanced endplate degeneration at the former, facet arthropathy at the latter. 3. No convincing spinal stenosis by CT. But moderate right lateral recess and severe right neural foraminal stenosis at L2-L3. Mild left lateral recess stenosis at L3-L4. 4.  Aortic Atherosclerosis  (ICD10-I70.0).   PATIENT SURVEYS:  Modified Oswestry 17/15 34%   COGNITION: Overall cognitive status: Within functional limits for tasks assessed     SENSATION:  WFL   POSTURE: rounded shoulders  SKIN: Incision healing well; no warmth noted  LUMBAR ROM: NT due to post surgery   LOWER EXTREMITY ROM:   WFL   LOWER EXTREMITY MMT:  Grossly 4-/5   FUNCTIONAL TESTS:  5 times sit to stand: 24.35 sec w. UE support Timed up and go (TUG): 27.10 sec with str cane. Pt experienced some dizziness after completing turn  GAIT: Distance walked: 25ft Assistive device utilized: Single point cane Level of assistance: Complete Independence Comments: decreased step length; patient experiences mild dizziness when turning  TREATMENT DATE:  03/20/2024    Initial Evaluation & HEP created Reviewed surgical precautions & logroll technique                                                                                                                        PATIENT EDUCATION:  Education details: Spinal precautions; log roll; POC Person educated: Patient Education method: Explanation, Demonstration, and Handouts Education comprehension: verbalized understanding, returned demonstration, and needs further education  HOME EXERCISE PROGRAM: Access Code: [email or web address] URL: [email or web address] Date: 03/20/2024 Prepared by: Claude Manges  Exercises - Supine Transversus Abdominis Bracing - Hands on Stomach  - 1 x daily - 7 x weekly - 2 sets - 10 reps - 4 sec hold - Supine Diaphragmatic Breathing  - 1 x daily - 7 x weekly - 1 sets - 10 reps - Seated Long Arc Quad  - 1 x daily - 7 x weekly - 2 sets - 10 reps - 2 hold - Standing Heel Raise with Support  - 1 x daily - 7 x weekly - 2 sets - 10 repsre  ASSESSMENT:  CLINICAL IMPRESSION: Patient is a 78 y.o. female who was seen today for physical therapy evaluation and treatment for lumbar radiculopathy.  Ryver presents to therapy status post  L2-L3 lumbar fusion on 02/18/2024. Patient had chronic back pain that progressively got worse. She has Meniere's disease and vestibular dysfunction with impairs her walking tolerance and functional mobility. Patient has had physical therapy in the past to address her balance, but she would like to continue address those impairments. Reviewed patient's spinal precautions and demonstrated safe lifting techniques. Patient verbalized understanding. Plan to have patient evaluation by vestibular physical therapist next treatment session. Patient will benefit from skilled PT to address the below impairments and improve overall function.    OBJECTIVE IMPAIRMENTS: Abnormal gait, decreased balance, decreased coordination, difficulty walking, decreased ROM, decreased strength, dizziness, increased muscle spasms, postural dysfunction, and pain.   ACTIVITY LIMITATIONS: carrying, lifting, bending, sitting, standing, squatting, transfers, and bed mobility  PARTICIPATION LIMITATIONS: cleaning, laundry, shopping, community activity, yard work, and church  PERSONAL FACTORS: Age, Fitness, and 1-2 comorbidities: Depression; osteoporosis  are also affecting patient's functional outcome.   REHAB POTENTIAL: Good  CLINICAL DECISION MAKING: Evolving/moderate complexity  EVALUATION COMPLEXITY: Moderate   GOALS: Goals reviewed with patient? Yes  SHORT TERM GOALS: Target date: 04/17/2024  Patient will be independent with initial HEP. Baseline:  Goal status: INITIAL  2.  Patient will report > or = to 40% improvement in functional activity since starting PT. Baseline:  Goal status: INITIAL  3.  Patient to undergo vestibular assessment by vestibular physical therapist. Baseline:  Goal status: INITIAL  4.  Patient will be able to perform STS with no UE support due to improved LE strength. Baseline:  Goal status: INITIAL   LONG TERM GOALS: Target date: 05/15/2024 Patient will demonstrate independence in advanced  HEP. Baseline:  Goal status: INITIAL  2.  Patient will demonstrate improved walking tolerance and be able to grocery shop with minimal limitations due to her back pain. Baseline:  Goal status: INITIAL  3.  Patient will verbalize and demonstrate self-care strategies to manage pain including tissue mobility practices and change of position. Baseline:  Goal status: INITIAL  3.  Patient will reports > or = 70% improvement in her dizziness since starting PT. Baseline:  Goal status: INITIAL  4.  Patient will be able to return to singing in her church choir and [employer].  Baseline:  Goal status: INITIAL  5.  Patient will perform tug in < or = to 17 sec for decreased falls risk.  Baseline: 27.10sec Goal status: INITIAL    PLAN:  PT FREQUENCY: 2x/week  PT DURATION: 8 weeks  PLANNED INTERVENTIONS: 97164- PT Re-evaluation, 97110-Therapeutic exercises, 97530- Therapeutic activity, 97112- Neuromuscular re-education, 97535- Self Care, 16109- Manual therapy, [phone]- Gait training, [phone]- Orthotic Fit/training, [phone]- Canalith repositioning, [id number]- Aquatic Therapy, [phone]- Electrical stimulation (unattended), 97016- Vasopneumatic device, [id number]- Ultrasound, [id number]- Ionotophoresis 4mg /ml Dexamethasone, Patient/Family education, Balance training, Stair training, Taping, Dry Needling, Joint mobilization, Joint manipulation, Vestibular training, Cryotherapy, and Moist heat.  PLAN FOR NEXT SESSION: assessment by vestibular therapist; review HEP; 3/6 mwt; DGI/FGA   Claude Manges, PT 03/20/24 5:21 PM [facility] [address] [place], [place] [address] Phone # [phone] Fax [phone]

## 2024-03-20 ENCOUNTER — Ambulatory Visit: Attending: Neurological Surgery | Admitting: Physical Therapy

## 2024-03-20 ENCOUNTER — Encounter: Payer: Self-pay | Admitting: Physical Therapy

## 2024-03-20 ENCOUNTER — Other Ambulatory Visit: Payer: Self-pay

## 2024-03-20 DIAGNOSIS — R2681 Unsteadiness on feet: Secondary | ICD-10-CM | POA: Insufficient documentation

## 2024-03-20 DIAGNOSIS — M5416 Radiculopathy, lumbar region: Secondary | ICD-10-CM | POA: Insufficient documentation

## 2024-03-20 DIAGNOSIS — R2689 Other abnormalities of gait and mobility: Secondary | ICD-10-CM | POA: Diagnosis not present

## 2024-03-20 DIAGNOSIS — M6281 Muscle weakness (generalized): Secondary | ICD-10-CM | POA: Insufficient documentation

## 2024-03-20 DIAGNOSIS — R42 Dizziness and giddiness: Secondary | ICD-10-CM | POA: Insufficient documentation

## 2024-03-21 ENCOUNTER — Encounter: Payer: Self-pay | Admitting: Rehabilitative and Restorative Service Providers"

## 2024-03-21 ENCOUNTER — Ambulatory Visit: Admitting: Rehabilitative and Restorative Service Providers"

## 2024-03-21 DIAGNOSIS — R2689 Other abnormalities of gait and mobility: Secondary | ICD-10-CM

## 2024-03-21 DIAGNOSIS — M5416 Radiculopathy, lumbar region: Secondary | ICD-10-CM

## 2024-03-21 DIAGNOSIS — M6281 Muscle weakness (generalized): Secondary | ICD-10-CM

## 2024-03-21 DIAGNOSIS — R2681 Unsteadiness on feet: Secondary | ICD-10-CM

## 2024-03-21 DIAGNOSIS — R42 Dizziness and giddiness: Secondary | ICD-10-CM

## 2024-03-21 NOTE — Therapy (Signed)
 OUTPATIENT PHYSICAL THERAPY VESTIBULAR ASSESSMENT   Patient Name: Lori Frost MRN: [id number] DOB:Dec 13, 1946, 78 y.o., female Today's Date: 03/21/2024  END OF SESSION:  PT End of Session - 03/21/24 1400     Visit Number 2    Date for PT Re-Evaluation 05/15/24    Authorization Type HTA    Progress Note Due on Visit 10    PT Start Time 1358    PT Stop Time 1440    PT Time Calculation (min) 42 min    Activity Tolerance Patient tolerated treatment well    Behavior During Therapy WFL for tasks assessed/performed             Past Medical History:  Diagnosis Date   Allergy    Non-Allergic Rhinitis   Balance problem due to vestibular dysfunction    Bloating    CAD (coronary artery disease)    Carotid artery occlusion    Cataract    Chordae tendineae rupture (HCC)    Cyst of ovary, right    Depression    Diverticulosis    DJD (degenerative joint disease)    Eczema    Family history of adverse reaction to anesthesia    SISTER "HEART STOPPED DURING [id number]"1478 UNKNOWN CAUSE -  SHE DIED 10 YRS LATER OF HEART ATTACK   GERD (gastroesophageal reflux disease)    ESOPHAGITIS PRESENCE NOT SPECIFIED   Granuloma annulare    History of hiatal hernia    History of recurrent UTI (urinary tract infection)    Hypercholesterolemia    Hyperlipidemia    Lower back pain    Meniere's disease    Migraine    Mitral valve regurgitation    MODERATE NORMAL, MILD AORTIC REGURGITATION, MILD TRICUSPID REGURGITATION, LVH, GRADE 1 DIASTOLIC DYSFUNCTION, ECHO 2018 SUGGESTS RUPTURED CHORDAE TENDINEAE ( DR. Donnie Aho)   MVP (mitral valve prolapse)    Myalgia    OA (osteoarthritis) of hip 02/12/2016   Osteoporosis    Other nonrheumatic mitral valve disorders    Psoriasis    Pupil dilated    DUE TO EYE DROPS FOR TX OF RT  EYE PROBLEM   Reactive hypoglycemia    Sensitive skin    Shingles    Vertigo    Vestibular neuronitis    Vitamin D deficiency    Past Surgical History:  Procedure  Laterality Date   EYE SURGERY Right 02-05-15   Cataract   EYE SURGERY Left 02-21-15   Cataract   TONSILLECTOMY     TOTAL HIP ARTHROPLASTY Right 02/12/2016   Procedure: RIGHT TOTAL HIP ARTHROPLASTY ANTERIOR APPROACH;  Surgeon: Ollen Gross, MD;  Location: [facility];  Service: Orthopedics;  Laterality: Right;   Patient Active Problem List   Diagnosis Date Noted   S/P lumbar fusion 02/18/2024   Mitral valve regurgitation 08/14/2021   Coronary artery calcification 08/14/2021   Aortic atherosclerosis (HCC) 08/14/2021   Hyperlipidemia 08/14/2021   Carotid artery disease (HCC) 08/14/2021   OA (osteoarthritis) of hip 02/12/2016    PCP: Bluford Main, MD  REFERRING PROVIDER: Arman Bogus, MD  REFERRING DIAG: M54.16 (ICD-10-CM) - Radiculopathy, lumbar region  Rationale for Evaluation and Treatment: Rehabilitation  THERAPY DIAG:  Radiculopathy, lumbar region  Muscle weakness (generalized)  Unsteadiness on feet  Dizziness and giddiness  Other abnormalities of gait and mobility  ONSET DATE: Surgery Date 02/18/2024  SUBJECTIVE:  SUBJECTIVE STATEMENT: Patient reports that she was close to being back to her dizziness level with Meniere's after her vestibular PT in the past, however, after her surgery, the dizziness has been worse.  PERTINENT HISTORY:  Depression; osteoporosis; vertigo; Meniere's disease; hx Rt total hip arthroplasty ; scoliosis   PAIN:  Are you having pain? Yes: NPRS scale: 3/ 10  Pain location: at incision site Pain description: throbbing; burning; sore Aggravating factors: continuous movement; reaching in cabinets; walking in grocery store  Relieving factors: laying down  PRECAUTIONS: Back lumbar fusion 3/7/225. Lifting < 10 lbs  RED FLAGS: None   WEIGHT BEARING  RESTRICTIONS: No  FALLS:  Has patient fallen in last 6 months? No  LIVING ENVIRONMENT: Lives with: lives alone Lives in: House/apartment Stairs: Yes: Internal: 1  steps; none Has following equipment at home: Single point cane  OCCUPATION: Retired  PLOF: Independent, Independent with basic ADLs, Independent with household mobility without device, and Independent with gait  PATIENT GOALS: Return to activities she does at her church; Return to [employer]; shop without issues;   NEXT MD VISIT: April 06, 2024  OBJECTIVE:  Note: Objective measures were completed at Evaluation unless otherwise noted.  DIAGNOSTIC FINDINGS:  12/29/2023 Lumbar CT without contrast  IMPRESSION: 1. Transitional lumbosacral anatomy. Designating normal lumbar segmentation results in hypoplastic ribs at L1. Correlation with radiographs is recommended prior to any operative intervention. 2. Levoconvex lumbar scoliosis with degenerative spondylolisthesis at L2-L3, L5-S1. Advanced endplate degeneration at the former, facet arthropathy at the latter. 3. No convincing spinal stenosis by CT. But moderate right lateral recess and severe right neural foraminal stenosis at L2-L3. Mild left lateral recess stenosis at L3-L4. 4.  Aortic Atherosclerosis (ICD10-I70.0).   PATIENT SURVEYS:  Eval:  Modified Oswestry 17/15 34%   COGNITION: Overall cognitive status: Within functional limits for tasks assessed     SENSATION: WFL   POSTURE: rounded shoulders  SKIN: Incision healing well; no warmth noted  LUMBAR ROM: NT due to post surgery   LOWER EXTREMITY ROM:   WFL   LOWER EXTREMITY MMT:  Grossly 4-/5   FUNCTIONAL TESTS:  Eval: 5 times sit to stand: 24.35 sec w. UE support Timed up and go (TUG): 27.10 sec with str cane. Pt experienced some dizziness after completing turn  03/21/2024: Modified CTSIB:  Condition 1- 30 sec (but self corrected loss of balance at 20 sec), Condition 2- 5 sec,  Condition 3- 30 sec (with increased sway), Condition 4- 2 sec.  Total- 67/120  GAIT: Distance walked: 23ft Assistive device utilized: Single point cane Level of assistance: Complete Independence Comments: decreased step length; patient experiences mild dizziness when turning  VESTIBULAR ASSESSMENT: 03/21/2024: Smooth pursuit:  nystagmus noted when looking to the right and up.  No nystagmus looking left Saccades:  WNL Gaze stabilization is WNL Head Impulse Test:  positive bilaterally, worse on right than left [place] negative bilaterally  TREATMENT DATE:  03/21/2024: Nustep level 3 x6 min with PT present to discuss status Vestibular Assessment, see above Modified CTSIB 67/120 Supine transversus abdominus contraction x10 Supine transversus abdominus contraction with pressing hands into thighs x10 (patient more easily able to isolate correct muscle contraction with this method)   03/20/2024    Initial Evaluation & HEP created Reviewed surgical precautions & logroll technique  PATIENT EDUCATION:  Education details: Spinal precautions; log roll; POC Person educated: Patient Education method: Explanation, Demonstration, and Handouts Education comprehension: verbalized understanding, returned demonstration, and needs further education  HOME EXERCISE PROGRAM: Access Code: [email or web address] URL: [email or web address] Date: 03/21/2024 Prepared by: Clydie Braun Magie Ciampa  Exercises - Supine Transversus Abdominis Bracing - Hands on Stomach  - 1 x daily - 7 x weekly - 2 sets - 10 reps - 4 sec hold - Supine Diaphragmatic Breathing  - 1 x daily - 7 x weekly - 1 sets - 10 reps - Seated Long Arc Quad  - 1 x daily - 7 x weekly - 2 sets - 10 reps - 2 hold - Standing Heel Raise with Support  - 1 x daily - 7 x weekly - 2 sets - 10 reps - Seated Horizontal Smooth Pursuit  - 1 x  daily - 7 x weekly - 2 sets - 10 reps - Seated Vertical Smooth Pursuit  - 1 x daily - 7 x weekly - 2 sets - 10 reps - Seated Proximal-Distal Smooth Pursuit  - 1 x daily - 7 x weekly - 2 sets - 10 reps - Seated Gaze Stabilization with Head Rotation  - 1 x daily - 7 x weekly - 2 sets - 30 sec hold - Seated Gaze Stabilization with Head Nod  - 1 x daily - 7 x weekly - 2 sets - 30 sec hold  ASSESSMENT:  CLINICAL IMPRESSION: Ms Henault presents to skilled PT reporting that her dizziness has overall worsened since her PLIF surgery.  Patient has a long-standing history of Meniere's Disease and continues to follow up there for her care at [facility].  Patient has had vestibular rehab in the past, and found it helpful for assisting with decreased dizziness and improved balance.  Patient presents with difficulty with smooth pursuits, especially towards the right side, as well as difficulty with head thrust test.  Wanted to rule out BPPV, so performed Gilberto Better, and patient with negative testing to bilateral sides.  Patient with most difficulty with balance activities on modified CTSIB where she had to close her eyes (condition 2 and 4).  Patient provided with updated HEP to assist with her noted vestibular impairments.  Patient would benefit from skilled PT to address her functional impairments and address her PT goals.    OBJECTIVE IMPAIRMENTS: Abnormal gait, decreased balance, decreased coordination, difficulty walking, decreased ROM, decreased strength, dizziness, increased muscle spasms, postural dysfunction, and pain.   ACTIVITY LIMITATIONS: carrying, lifting, bending, sitting, standing, squatting, transfers, and bed mobility  PARTICIPATION LIMITATIONS: cleaning, laundry, shopping, community activity, yard work, and church  PERSONAL FACTORS: Age, Fitness, and 1-2 comorbidities: Depression; osteoporosis  are also affecting patient's functional outcome.   REHAB POTENTIAL: Good  CLINICAL DECISION MAKING:  Evolving/moderate complexity  EVALUATION COMPLEXITY: Moderate   GOALS: Goals reviewed with patient? Yes  SHORT TERM GOALS: Target date: 04/17/2024  Patient will be independent with initial HEP. Baseline:  Goal status: Ongoing  2.  Patient will report > or = to 40% improvement in functional activity since starting PT. Baseline:  Goal status: INITIAL  3.  Patient to undergo vestibular assessment by vestibular physical therapist. Baseline:  Goal status: Met on 03/21/2024  4.  Patient will be able to perform STS with no UE support due to improved LE strength. Baseline:  Goal status: INITIAL   LONG TERM GOALS: Target date: 05/15/2024 Patient will demonstrate independence in advanced HEP. Baseline:  Goal status: INITIAL  2.  Patient  will demonstrate improved walking tolerance and be able to grocery shop with minimal limitations due to her back pain. Baseline:  Goal status: INITIAL  3.  Patient will verbalize and demonstrate self-care strategies to manage pain including tissue mobility practices and change of position. Baseline:  Goal status: INITIAL  3.  Patient will reports > or = 70% improvement in her dizziness since starting PT. Baseline:  Goal status: INITIAL  4.  Patient will be able to return to singing in her church choir and [employer].  Baseline:  Goal status: INITIAL  5.  Patient will perform tug in < or = to 17 sec for decreased falls risk.  Baseline: 27.10sec Goal status: INITIAL    PLAN:  PT FREQUENCY: 2x/week  PT DURATION: 8 weeks  PLANNED INTERVENTIONS: 97164- PT Re-evaluation, 97110-Therapeutic exercises, 97530- Therapeutic activity, 97112- Neuromuscular re-education, 97535- Self Care, 21308- Manual therapy, [phone]- Gait training, [phone]- Orthotic Fit/training, [phone]- Canalith repositioning, [id number]- Aquatic Therapy, [phone]- Electrical stimulation (unattended), 97016- Vasopneumatic device, [id number]- Ultrasound, [id number]- Ionotophoresis 4mg /ml  Dexamethasone, Patient/Family education, Balance training, Stair training, Taping, Dry Needling, Joint mobilization, Joint manipulation, Vestibular training, Cryotherapy, and Moist heat.  PLAN FOR NEXT SESSION: review HEP; 3/6 mwt; DGI/FGA   Reather Laurence, PT, DPT 03/21/24, 3:46 PM  [facility] [address], [address] [place], [place] [address] Phone # [phone] Fax [phone]

## 2024-03-22 DIAGNOSIS — M5416 Radiculopathy, lumbar region: Secondary | ICD-10-CM | POA: Diagnosis not present

## 2024-03-23 ENCOUNTER — Ambulatory Visit: Admitting: Physical Therapy

## 2024-03-23 ENCOUNTER — Encounter: Payer: Self-pay | Admitting: Physical Therapy

## 2024-03-23 DIAGNOSIS — M6281 Muscle weakness (generalized): Secondary | ICD-10-CM

## 2024-03-23 DIAGNOSIS — M5416 Radiculopathy, lumbar region: Secondary | ICD-10-CM

## 2024-03-23 DIAGNOSIS — R2689 Other abnormalities of gait and mobility: Secondary | ICD-10-CM

## 2024-03-23 DIAGNOSIS — R42 Dizziness and giddiness: Secondary | ICD-10-CM

## 2024-03-23 DIAGNOSIS — R2681 Unsteadiness on feet: Secondary | ICD-10-CM

## 2024-03-23 NOTE — Therapy (Signed)
 OUTPATIENT PHYSICAL THERAPY VESTIBULAR ASSESSMENT   Patient Name: Lori Frost MRN: [id number] DOB:04-06-1946, 78 y.o., female Today's Date: 03/23/2024  END OF SESSION:  PT End of Session - 03/23/24 0930     Visit Number 3    Date for PT Re-Evaluation 05/15/24    Authorization Type HTA    Progress Note Due on Visit 10    PT Start Time 0840    PT Stop Time 0926    PT Time Calculation (min) 46 min    Activity Tolerance Patient tolerated treatment well    Behavior During Therapy WFL for tasks assessed/performed              Past Medical History:  Diagnosis Date   Allergy    Non-Allergic Rhinitis   Balance problem due to vestibular dysfunction    Bloating    CAD (coronary artery disease)    Carotid artery occlusion    Cataract    Chordae tendineae rupture (HCC)    Cyst of ovary, right    Depression    Diverticulosis    DJD (degenerative joint disease)    Eczema    Family history of adverse reaction to anesthesia    SISTER "HEART STOPPED DURING [id number] UNKNOWN CAUSE -  SHE DIED 10 YRS LATER OF HEART ATTACK   GERD (gastroesophageal reflux disease)    ESOPHAGITIS PRESENCE NOT SPECIFIED   Granuloma annulare    History of hiatal hernia    History of recurrent UTI (urinary tract infection)    Hypercholesterolemia    Hyperlipidemia    Lower back pain    Meniere's disease    Migraine    Mitral valve regurgitation    MODERATE NORMAL, MILD AORTIC REGURGITATION, MILD TRICUSPID REGURGITATION, LVH, GRADE 1 DIASTOLIC DYSFUNCTION, ECHO 2018 SUGGESTS RUPTURED CHORDAE TENDINEAE ( DR. Donnie Aho)   MVP (mitral valve prolapse)    Myalgia    OA (osteoarthritis) of hip 02/12/2016   Osteoporosis    Other nonrheumatic mitral valve disorders    Psoriasis    Pupil dilated    DUE TO EYE DROPS FOR TX OF RT  EYE PROBLEM   Reactive hypoglycemia    Sensitive skin    Shingles    Vertigo    Vestibular neuronitis    Vitamin D deficiency    Past Surgical History:  Procedure  Laterality Date   EYE SURGERY Right 02-05-15   Cataract   EYE SURGERY Left 02-21-15   Cataract   TONSILLECTOMY     TOTAL HIP ARTHROPLASTY Right 02/12/2016   Procedure: RIGHT TOTAL HIP ARTHROPLASTY ANTERIOR APPROACH;  Surgeon: Ollen Gross, MD;  Location: [facility];  Service: Orthopedics;  Laterality: Right;   Patient Active Problem List   Diagnosis Date Noted   S/P lumbar fusion 02/18/2024   Mitral valve regurgitation 08/14/2021   Coronary artery calcification 08/14/2021   Aortic atherosclerosis (HCC) 08/14/2021   Hyperlipidemia 08/14/2021   Carotid artery disease (HCC) 08/14/2021   OA (osteoarthritis) of hip 02/12/2016    PCP: Bluford Main, MD  REFERRING PROVIDER: Arman Bogus, MD  REFERRING DIAG: M54.16 (ICD-10-CM) - Radiculopathy, lumbar region  Rationale for Evaluation and Treatment: Rehabilitation  THERAPY DIAG:  Radiculopathy, lumbar region  Muscle weakness (generalized)  Unsteadiness on feet  Dizziness and giddiness  Other abnormalities of gait and mobility  ONSET DATE: Surgery Date 02/18/2024  SUBJECTIVE:  SUBJECTIVE STATEMENT: Patient reports she has a little discomfort in her back today. Pain is 2-3/10. She has been compliant with HEP.  PERTINENT HISTORY:  Depression; osteoporosis; vertigo; Meniere's disease; hx Rt total hip arthroplasty ; scoliosis   PAIN:  Are you having pain? Yes: NPRS scale: 3/ 10  Pain location: at incision site Pain description: throbbing; burning; sore Aggravating factors: continuous movement; reaching in cabinets; walking in grocery store  Relieving factors: laying down  PRECAUTIONS: Back lumbar fusion 3/7/225. Lifting < 10 lbs  RED FLAGS: None   WEIGHT BEARING RESTRICTIONS: No  FALLS:  Has patient fallen in last 6 months? No  LIVING  ENVIRONMENT: Lives with: lives alone Lives in: House/apartment Stairs: Yes: Internal: 1  steps; none Has following equipment at home: Single point cane  OCCUPATION: Retired  PLOF: Independent, Independent with basic ADLs, Independent with household mobility without device, and Independent with gait  PATIENT GOALS: Return to activities she does at her church; Return to [employer]; shop without issues;   NEXT MD VISIT: April 06, 2024  OBJECTIVE:  Note: Objective measures were completed at Evaluation unless otherwise noted.  DIAGNOSTIC FINDINGS:  12/29/2023 Lumbar CT without contrast  IMPRESSION: 1. Transitional lumbosacral anatomy. Designating normal lumbar segmentation results in hypoplastic ribs at L1. Correlation with radiographs is recommended prior to any operative intervention. 2. Levoconvex lumbar scoliosis with degenerative spondylolisthesis at L2-L3, L5-S1. Advanced endplate degeneration at the former, facet arthropathy at the latter. 3. No convincing spinal stenosis by CT. But moderate right lateral recess and severe right neural foraminal stenosis at L2-L3. Mild left lateral recess stenosis at L3-L4. 4.  Aortic Atherosclerosis (ICD10-I70.0).   PATIENT SURVEYS:  Eval:  Modified Oswestry 17/15 34%   COGNITION: Overall cognitive status: Within functional limits for tasks assessed     SENSATION: WFL   POSTURE: rounded shoulders  SKIN: Incision healing well; no warmth noted  LUMBAR ROM: NT due to post surgery   LOWER EXTREMITY ROM:   WFL   LOWER EXTREMITY MMT:  Grossly 4-/5   FUNCTIONAL TESTS:  Eval: 5 times sit to stand: 24.35 sec w. UE support Timed up and go (TUG): 27.10 sec with str cane. Pt experienced some dizziness after completing turn  03/21/2024: Modified CTSIB:  Condition 1- 30 sec (but self corrected loss of balance at 20 sec), Condition 2- 5 sec, Condition 3- 30 sec (with increased sway), Condition 4- 2 sec.  Total-  67/120    03/23/2024 6 MWT with str cane:647ft- increased anterior Lt hip pain at 2 minute mark. Patient frequently touches walls when ambulating. She verbalized dizziness when people were walking around her.    GAIT: Distance walked: 58ft Assistive device utilized: Single point cane Level of assistance: Complete Independence Comments: decreased step length; patient experiences mild dizziness when turning  VESTIBULAR ASSESSMENT: 03/21/2024: Smooth pursuit:  nystagmus noted when looking to the right and up.  No nystagmus looking left Saccades:  WNL Gaze stabilization is WNL Head Impulse Test:  positive bilaterally, worse on right than left [place] negative bilaterally   TREATMENT DATE:  03/23/2024: Nustep level 4 x7  min with PT present to discuss status (seat 6) 6 minutes walk test with str can: 644ft- increased anterior Lt hip pain at 2 minute mark. Patient frequently touches walls when ambulating. She verbalized dizziness when people were walking around her.  Marching on airex 2 x 10 bilateral  Hip abduction on airex 2 x 10 bilateral  Heel raises on airex 2 x  10 Standing shoulder extension with red TB 2 x 10 Standing shoulder row with/ red TB 2 x 10 Seated alt hand and knee press x 20 Supine transversus abdominus contraction with pressing hands into thighs x10 each  Supine hamstring stretch 2 x 30 sec bilateral  Supine ITB stretch 2 x 30 sec bilateral    03/21/2024: Nustep level 3 x6 min with PT present to discuss status Vestibular Assessment, see above Modified CTSIB 67/120 Supine transversus abdominus contraction x10 Supine transversus abdominus contraction with pressing hands into thighs x10 (patient more easily able to isolate correct muscle contraction with this method)   03/20/2024    Initial Evaluation & HEP created Reviewed surgical precautions & logroll technique                                                                                                                         PATIENT EDUCATION:  Education details: Spinal precautions; log roll; POC Person educated: Patient Education method: Explanation, Demonstration, and Handouts Education comprehension: verbalized understanding, returned demonstration, and needs further education  HOME EXERCISE PROGRAM: Access Code: [email or web address] URL: [email or web address] Date: 03/21/2024 Prepared by: Clydie Braun Menke  Exercises - Supine Transversus Abdominis Bracing - Hands on Stomach  - 1 x daily - 7 x weekly - 2 sets - 10 reps - 4 sec hold - Supine Diaphragmatic Breathing  - 1 x daily - 7 x weekly - 1 sets - 10 reps - Seated Long Arc Quad  - 1 x daily - 7 x weekly - 2 sets - 10 reps - 2 hold - Standing Heel Raise with Support  - 1 x daily - 7 x weekly - 2 sets - 10 reps - Seated Horizontal Smooth Pursuit  - 1 x daily - 7 x weekly - 2 sets - 10 reps - Seated Vertical Smooth Pursuit  - 1 x daily - 7 x weekly - 2 sets - 10 reps - Seated Proximal-Distal Smooth Pursuit  - 1 x daily - 7 x weekly - 2 sets - 10 reps - Seated Gaze Stabilization with Head Rotation  - 1 x daily - 7 x weekly - 2 sets - 30 sec hold - Seated Gaze Stabilization with Head Nod  - 1 x daily - 7 x weekly - 2 sets - 30 sec hold  ASSESSMENT:  CLINICAL IMPRESSION: Anayla presents to therapy with minimal back pain. She verbalized compliance with HEP exercises. Administered 6 MWT and patient ambulated 662ft and required a 1 minute seated rest break due to increased Lt hip pain. When ambulating patient frequently touched the wall to maintain balance. She verbalized increased dizziness with turns and when people were walking around her. She had occasionally bilateral hip pain during treatment session but she was able to tolerate all exercise. Patient will benefit from skilled PT to address the below impairments and improve overall function.     OBJECTIVE IMPAIRMENTS: Abnormal gait, decreased balance, decreased coordination, difficulty  walking, decreased ROM, decreased strength, dizziness,  increased muscle spasms, postural dysfunction, and pain.   ACTIVITY LIMITATIONS: carrying, lifting, bending, sitting, standing, squatting, transfers, and bed mobility  PARTICIPATION LIMITATIONS: cleaning, laundry, shopping, community activity, yard work, and church  PERSONAL FACTORS: Age, Fitness, and 1-2 comorbidities: Depression; osteoporosis  are also affecting patient's functional outcome.   REHAB POTENTIAL: Good  CLINICAL DECISION MAKING: Evolving/moderate complexity  EVALUATION COMPLEXITY: Moderate   GOALS: Goals reviewed with patient? Yes  SHORT TERM GOALS: Target date: 04/17/2024  Patient will be independent with initial HEP. Baseline:  Goal status: Ongoing  2.  Patient will report > or = to 40% improvement in functional activity since starting PT. Baseline:  Goal status: INITIAL  3.  Patient to undergo vestibular assessment by vestibular physical therapist. Baseline:  Goal status: Met on 03/21/2024  4.  Patient will be able to perform STS with no UE support due to improved LE strength. Baseline:  Goal status: INITIAL   LONG TERM GOALS: Target date: 05/15/2024 Patient will demonstrate independence in advanced HEP. Baseline:  Goal status: INITIAL  2.  Patient will demonstrate improved walking tolerance and be able to grocery shop with minimal limitations due to her back pain. Baseline:  Goal status: INITIAL  3.  Patient will verbalize and demonstrate self-care strategies to manage pain including tissue mobility practices and change of position. Baseline:  Goal status: INITIAL  3.  Patient will reports > or = 70% improvement in her dizziness since starting PT. Baseline:  Goal status: INITIAL  4.  Patient will be able to return to singing in her church choir and [employer].  Baseline:  Goal status: INITIAL  5.  Patient will perform tug in < or = to 17 sec for decreased falls risk.  Baseline:  27.10sec Goal status: INITIAL    PLAN:  PT FREQUENCY: 2x/week  PT DURATION: 8 weeks  PLANNED INTERVENTIONS: 97164- PT Re-evaluation, 97110-Therapeutic exercises, 97530- Therapeutic activity, 97112- Neuromuscular re-education, 97535- Self Care, 16109- Manual therapy, [phone]- Gait training, [phone]- Orthotic Fit/training, [phone]- Canalith repositioning, [id number]- Aquatic Therapy, [phone]- Electrical stimulation (unattended), 97016- Vasopneumatic device, [id number]- Ultrasound, [id number]- Ionotophoresis 4mg /ml Dexamethasone, Patient/Family education, Balance training, Stair training, Taping, Dry Needling, Joint mobilization, Joint manipulation, Vestibular training, Cryotherapy, and Moist heat.  PLAN FOR NEXT SESSION: assess tolerance to treatment session; hip and core strengthening; update HEP with core exercises   Claude Manges, PT 03/23/24 9:31 AM [facility] [address], [address] [place], [place] [address] Phone # [phone] Fax [phone]

## 2024-03-27 ENCOUNTER — Ambulatory Visit: Admitting: Rehabilitative and Restorative Service Providers"

## 2024-03-27 ENCOUNTER — Encounter: Payer: Self-pay | Admitting: Rehabilitative and Restorative Service Providers"

## 2024-03-27 DIAGNOSIS — M6281 Muscle weakness (generalized): Secondary | ICD-10-CM

## 2024-03-27 DIAGNOSIS — M5416 Radiculopathy, lumbar region: Secondary | ICD-10-CM | POA: Diagnosis not present

## 2024-03-27 DIAGNOSIS — R42 Dizziness and giddiness: Secondary | ICD-10-CM

## 2024-03-27 DIAGNOSIS — R2681 Unsteadiness on feet: Secondary | ICD-10-CM

## 2024-03-27 DIAGNOSIS — R2689 Other abnormalities of gait and mobility: Secondary | ICD-10-CM

## 2024-03-27 NOTE — Therapy (Signed)
 OUTPATIENT PHYSICAL THERAPY VESTIBULAR ASSESSMENT   Patient Name: Lori Frost MRN: [id number] DOB:30-Dec-1945, 78 y.o., female Today's Date: 03/27/2024  END OF SESSION:  PT End of Session - 03/27/24 0850     Visit Number 4    Date for PT Re-Evaluation 05/15/24    Authorization Type HTA    Progress Note Due on Visit 10    PT Start Time 0845    PT Stop Time 0925    PT Time Calculation (min) 40 min    Activity Tolerance Patient tolerated treatment well    Behavior During Therapy WFL for tasks assessed/performed              Past Medical History:  Diagnosis Date   Allergy    Non-Allergic Rhinitis   Balance problem due to vestibular dysfunction    Bloating    CAD (coronary artery disease)    Carotid artery occlusion    Cataract    Chordae tendineae rupture (HCC)    Cyst of ovary, right    Depression    Diverticulosis    DJD (degenerative joint disease)    Eczema    Family history of adverse reaction to anesthesia    SISTER "HEART STOPPED DURING NWGNFAO"1308 UNKNOWN CAUSE -  SHE DIED 10 YRS LATER OF HEART ATTACK   GERD (gastroesophageal reflux disease)    ESOPHAGITIS PRESENCE NOT SPECIFIED   Granuloma annulare    History of hiatal hernia    History of recurrent UTI (urinary tract infection)    Hypercholesterolemia    Hyperlipidemia    Lower back pain    Meniere's disease    Migraine    Mitral valve regurgitation    MODERATE NORMAL, MILD AORTIC REGURGITATION, MILD TRICUSPID REGURGITATION, LVH, GRADE 1 DIASTOLIC DYSFUNCTION, ECHO 2018 SUGGESTS RUPTURED CHORDAE TENDINEAE ( DR. Donnie Aho)   MVP (mitral valve prolapse)    Myalgia    OA (osteoarthritis) of hip 02/12/2016   Osteoporosis    Other nonrheumatic mitral valve disorders    Psoriasis    Pupil dilated    DUE TO EYE DROPS FOR TX OF RT  EYE PROBLEM   Reactive hypoglycemia    Sensitive skin    Shingles    Vertigo    Vestibular neuronitis    Vitamin D deficiency    Past Surgical History:  Procedure  Laterality Date   EYE SURGERY Right 02-05-15   Cataract   EYE SURGERY Left 02-21-15   Cataract   TONSILLECTOMY     TOTAL HIP ARTHROPLASTY Right 02/12/2016   Procedure: RIGHT TOTAL HIP ARTHROPLASTY ANTERIOR APPROACH;  Surgeon: Ollen Gross, MD;  Location: [facility];  Service: Orthopedics;  Laterality: Right;   Patient Active Problem List   Diagnosis Date Noted   S/P lumbar fusion 02/18/2024   Mitral valve regurgitation 08/14/2021   Coronary artery calcification 08/14/2021   Aortic atherosclerosis (HCC) 08/14/2021   Hyperlipidemia 08/14/2021   Carotid artery disease (HCC) 08/14/2021   OA (osteoarthritis) of hip 02/12/2016    PCP: Bluford Main, MD  REFERRING PROVIDER: Arman Bogus, MD  REFERRING DIAG: M54.16 (ICD-10-CM) - Radiculopathy, lumbar region  Rationale for Evaluation and Treatment: Rehabilitation  THERAPY DIAG:  Radiculopathy, lumbar region  Muscle weakness (generalized)  Unsteadiness on feet  Dizziness and giddiness  Other abnormalities of gait and mobility  ONSET DATE: Surgery Date 02/18/2024  SUBJECTIVE:  SUBJECTIVE STATEMENT: Patient reports she has had approximately 40% improvement since starting skilled PT.  States that "the more I do, the more the flare ups happen."  Patient reports that the dizziness is about the same since vestibular assessment, but still remarkably better than immediately after surgery.  PERTINENT HISTORY:  Depression; osteoporosis; vertigo; Meniere's disease; hx Rt total hip arthroplasty ; scoliosis   PAIN:  Are you having pain? Yes: NPRS scale: 2/ 10  Pain location: at incision site Pain description: throbbing; burning; sore Aggravating factors: continuous movement; reaching in cabinets; walking in grocery store  Relieving factors: laying  down  PRECAUTIONS: Back lumbar fusion 3/7/225. Lifting < 10 lbs  RED FLAGS: None   WEIGHT BEARING RESTRICTIONS: No  FALLS:  Has patient fallen in last 6 months? No  LIVING ENVIRONMENT: Lives with: lives alone Lives in: House/apartment Stairs: Yes: Internal: 1  steps; none Has following equipment at home: Single point cane  OCCUPATION: Retired  PLOF: Independent, Independent with basic ADLs, Independent with household mobility without device, and Independent with gait  PATIENT GOALS: Return to activities she does at her church; Return to [employer]; shop without issues;   NEXT MD VISIT: April 06, 2024  OBJECTIVE:  Note: Objective measures were completed at Evaluation unless otherwise noted.  DIAGNOSTIC FINDINGS:  12/29/2023 Lumbar CT without contrast  IMPRESSION: 1. Transitional lumbosacral anatomy. Designating normal lumbar segmentation results in hypoplastic ribs at L1. Correlation with radiographs is recommended prior to any operative intervention. 2. Levoconvex lumbar scoliosis with degenerative spondylolisthesis at L2-L3, L5-S1. Advanced endplate degeneration at the former, facet arthropathy at the latter. 3. No convincing spinal stenosis by CT. But moderate right lateral recess and severe right neural foraminal stenosis at L2-L3. Mild left lateral recess stenosis at L3-L4. 4.  Aortic Atherosclerosis (ICD10-I70.0).   PATIENT SURVEYS:  Eval:  Modified Oswestry 17/15 34%   COGNITION: Overall cognitive status: Within functional limits for tasks assessed     SENSATION: WFL   POSTURE: rounded shoulders  SKIN: Incision healing well; no warmth noted  LUMBAR ROM: NT due to post surgery   LOWER EXTREMITY ROM:   WFL   LOWER EXTREMITY MMT:  Grossly 4-/5   FUNCTIONAL TESTS:  Eval: 5 times sit to stand: 24.35 sec w. UE support Timed up and go (TUG): 27.10 sec with str cane. Pt experienced some dizziness after completing  turn  03/21/2024: Modified CTSIB:  Condition 1- 30 sec (but self corrected loss of balance at 20 sec), Condition 2- 5 sec, Condition 3- 30 sec (with increased sway), Condition 4- 2 sec.  Total- 67/120    03/23/2024 6 MWT with str cane:659ft- increased anterior Lt hip pain at 2 minute mark. Patient frequently touches walls when ambulating. She verbalized dizziness when people were walking around her.    GAIT: Distance walked: 37ft Assistive device utilized: Single point cane Level of assistance: Complete Independence Comments: decreased step length; patient experiences mild dizziness when turning  VESTIBULAR ASSESSMENT: 03/21/2024: Smooth pursuit:  nystagmus noted when looking to the right and up.  No nystagmus looking left Saccades:  WNL Gaze stabilization is WNL Head Impulse Test:  positive bilaterally, worse on right than left [place] negative bilaterally   TREATMENT DATE:  03/27/2024: Nustep level 4 x7  min with PT present to discuss status Sit to stand with UE crossed x5 Seated blue pball rollout 5x5 sec Standing shoulder rows with red tband 2x10 Standing shoulder extension with red tband 2x10 Standing paloff press with red  tband x10 bilat Seated transversus abdominus contraction with pressing ball into thighs 2x10 Supine hamstring stretch with strap 2x20 sec bilat Supine IT band stretch with strap 2x20 sec bilat Standing at barre on foam pad:  hip abduction and hip extension x10 each bilat Standing at barre on foam pad with eyes open 2x30 sec for balance Standing at barre on foam pad: with eyes closed and close CGA 2x20 sec Marching on trampoline x1 min   03/23/2024: Nustep level 4 x7  min with PT present to discuss status (seat 6) 6 minutes walk test with str can: 676ft- increased anterior Lt hip pain at 2 minute mark. Patient frequently touches walls when ambulating. She verbalized dizziness when people were walking around her.  Marching on airex 2 x 10 bilateral  Hip  abduction on airex 2 x 10 bilateral  Heel raises on airex 2 x 10 Standing shoulder extension with red TB 2 x 10 Standing shoulder row with/ red TB 2 x 10 Seated alt hand and knee press x 20 Supine transversus abdominus contraction with pressing hands into thighs x10 each  Supine hamstring stretch 2 x 30 sec bilateral  Supine ITB stretch 2 x 30 sec bilateral    03/21/2024: Nustep level 3 x6 min with PT present to discuss status Vestibular Assessment, see above Modified CTSIB 67/120 Supine transversus abdominus contraction x10 Supine transversus abdominus contraction with pressing hands into thighs x10 (patient more easily able to isolate correct muscle contraction with this method)   PATIENT EDUCATION:  Education details: Spinal precautions; log roll; POC Person educated: Patient Education method: Explanation, Demonstration, and Handouts Education comprehension: verbalized understanding, returned demonstration, and needs further education  HOME EXERCISE PROGRAM: Access Code: [email or web address] URL: [email or web address] Date: 03/21/2024 Prepared by: Chaneta Comer Kyoko Elsea  Exercises - Supine Transversus Abdominis Bracing - Hands on Stomach  - 1 x daily - 7 x weekly - 2 sets - 10 reps - 4 sec hold - Supine Diaphragmatic Breathing  - 1 x daily - 7 x weekly - 1 sets - 10 reps - Seated Long Arc Quad  - 1 x daily - 7 x weekly - 2 sets - 10 reps - 2 hold - Standing Heel Raise with Support  - 1 x daily - 7 x weekly - 2 sets - 10 reps - Seated Horizontal Smooth Pursuit  - 1 x daily - 7 x weekly - 2 sets - 10 reps - Seated Vertical Smooth Pursuit  - 1 x daily - 7 x weekly - 2 sets - 10 reps - Seated Proximal-Distal Smooth Pursuit  - 1 x daily - 7 x weekly - 2 sets - 10 reps - Seated Gaze Stabilization with Head Rotation  - 1 x daily - 7 x weekly - 2 sets - 30 sec hold - Seated Gaze Stabilization with Head Nod  - 1 x daily - 7 x weekly - 2 sets - 30 sec hold  ASSESSMENT:  CLINICAL  IMPRESSION: Ms Vasseur presents to skilled PT reporting that she can tell that she is making some progress.  Patient states that she has been performing her exercises at home, including the vestibular ones.  Patient able to progress with being able to perform a stand without UE support.  Patient continues to tolerate stretching well with only minimal cuing for improved technique.  Patient requires close CGA/min A with standing on balance pad without UE support, especially with eyes closed.  All short-term goals have been met at this time.  Patient continues  to require skilled PT to progress towards goal related activities.     OBJECTIVE IMPAIRMENTS: Abnormal gait, decreased balance, decreased coordination, difficulty walking, decreased ROM, decreased strength, dizziness, increased muscle spasms, postural dysfunction, and pain.   ACTIVITY LIMITATIONS: carrying, lifting, bending, sitting, standing, squatting, transfers, and bed mobility  PARTICIPATION LIMITATIONS: cleaning, laundry, shopping, community activity, yard work, and church  PERSONAL FACTORS: Age, Fitness, and 1-2 comorbidities: Depression; osteoporosis  are also affecting patient's functional outcome.   REHAB POTENTIAL: Good  CLINICAL DECISION MAKING: Evolving/moderate complexity  EVALUATION COMPLEXITY: Moderate   GOALS: Goals reviewed with patient? Yes  SHORT TERM GOALS: Target date: 04/17/2024  Patient will be independent with initial HEP. Baseline:  Goal status: Met on 03/27/24  2.  Patient will report > or = to 40% improvement in functional activity since starting PT. Baseline:  Goal status: Met on 03/27/24  3.  Patient to undergo vestibular assessment by vestibular physical therapist. Baseline:  Goal status: Met on 03/21/2024  4.  Patient will be able to perform STS with no UE support due to improved LE strength. Baseline:  Goal status: Met on 03/27/24   LONG TERM GOALS: Target date: 05/15/2024 Patient will demonstrate  independence in advanced HEP. Baseline:  Goal status: Ongoing  2.  Patient will demonstrate improved walking tolerance and be able to grocery shop with minimal limitations due to her back pain. Baseline:  Goal status: INITIAL  3.  Patient will verbalize and demonstrate self-care strategies to manage pain including tissue mobility practices and change of position. Baseline:  Goal status: INITIAL  3.  Patient will reports > or = 70% improvement in her dizziness since starting PT. Baseline:  Goal status: INITIAL  4.  Patient will be able to return to singing in her church choir and [employer].  Baseline:  Goal status: INITIAL  5.  Patient will perform tug in < or = to 17 sec for decreased falls risk.  Baseline: 27.10sec Goal status: INITIAL    PLAN:  PT FREQUENCY: 2x/week  PT DURATION: 8 weeks  PLANNED INTERVENTIONS: 97164- PT Re-evaluation, 97110-Therapeutic exercises, 97530- Therapeutic activity, 97112- Neuromuscular re-education, 97535- Self Care, 40981- Manual therapy, [phone]- Gait training, [phone]- Orthotic Fit/training, [phone]- Canalith repositioning, [id number]- Aquatic Therapy, [phone]- Electrical stimulation (unattended), 97016- Vasopneumatic device, [id number]- Ultrasound, [id number]- Ionotophoresis 4mg /ml Dexamethasone, Patient/Family education, Balance training, Stair training, Taping, Dry Needling, Joint mobilization, Joint manipulation, Vestibular training, Cryotherapy, and Moist heat.  PLAN FOR NEXT SESSION: assess tolerance to treatment session; hip and core strengthening; update HEP with core exercises   Robyne Christen, PT, DPT 03/27/24, 11:40 AM  [facility] [address] [place], [place] [address] Phone # [phone] Fax [phone]

## 2024-03-28 ENCOUNTER — Ambulatory Visit: Admitting: Physical Therapy

## 2024-03-29 ENCOUNTER — Ambulatory Visit: Admitting: Physical Therapy

## 2024-03-29 ENCOUNTER — Encounter: Payer: Self-pay | Admitting: Physical Therapy

## 2024-03-29 DIAGNOSIS — R2689 Other abnormalities of gait and mobility: Secondary | ICD-10-CM

## 2024-03-29 DIAGNOSIS — M5416 Radiculopathy, lumbar region: Secondary | ICD-10-CM | POA: Diagnosis not present

## 2024-03-29 DIAGNOSIS — R2681 Unsteadiness on feet: Secondary | ICD-10-CM

## 2024-03-29 DIAGNOSIS — R42 Dizziness and giddiness: Secondary | ICD-10-CM

## 2024-03-29 DIAGNOSIS — M6281 Muscle weakness (generalized): Secondary | ICD-10-CM

## 2024-03-29 NOTE — Patient Instructions (Signed)

## 2024-03-29 NOTE — Therapy (Signed)
 OUTPATIENT PHYSICAL THERAPY VESTIBULAR / LOW BACK TREATMENT   Patient Name: Lori Frost MRN: [id number] DOB:1946/02/14, 78 y.o., female Today's Date: 03/29/2024  END OF SESSION:  PT End of Session - 03/29/24 1458     Visit Number 5    Date for PT Re-Evaluation 05/15/24    Authorization Type HTA    Progress Note Due on Visit 10    PT Start Time 1401    PT Stop Time 1453    PT Time Calculation (min) 52 min    Activity Tolerance Patient tolerated treatment well    Behavior During Therapy WFL for tasks assessed/performed               Past Medical History:  Diagnosis Date   Allergy    Non-Allergic Rhinitis   Balance problem due to vestibular dysfunction    Bloating    CAD (coronary artery disease)    Carotid artery occlusion    Cataract    Chordae tendineae rupture (HCC)    Cyst of ovary, right    Depression    Diverticulosis    DJD (degenerative joint disease)    Eczema    Family history of adverse reaction to anesthesia    SISTER "HEART STOPPED DURING [id number]"1478 UNKNOWN CAUSE -  SHE DIED 10 YRS LATER OF HEART ATTACK   GERD (gastroesophageal reflux disease)    ESOPHAGITIS PRESENCE NOT SPECIFIED   Granuloma annulare    History of hiatal hernia    History of recurrent UTI (urinary tract infection)    Hypercholesterolemia    Hyperlipidemia    Lower back pain    Meniere's disease    Migraine    Mitral valve regurgitation    MODERATE NORMAL, MILD AORTIC REGURGITATION, MILD TRICUSPID REGURGITATION, LVH, GRADE 1 DIASTOLIC DYSFUNCTION, ECHO 2018 SUGGESTS RUPTURED CHORDAE TENDINEAE ( DR. Donnie Aho)   MVP (mitral valve prolapse)    Myalgia    OA (osteoarthritis) of hip 02/12/2016   Osteoporosis    Other nonrheumatic mitral valve disorders    Psoriasis    Pupil dilated    DUE TO EYE DROPS FOR TX OF RT  EYE PROBLEM   Reactive hypoglycemia    Sensitive skin    Shingles    Vertigo    Vestibular neuronitis    Vitamin D deficiency    Past Surgical History:   Procedure Laterality Date   EYE SURGERY Right 02-05-15   Cataract   EYE SURGERY Left 02-21-15   Cataract   TONSILLECTOMY     TOTAL HIP ARTHROPLASTY Right 02/12/2016   Procedure: RIGHT TOTAL HIP ARTHROPLASTY ANTERIOR APPROACH;  Surgeon: Ollen Gross, MD;  Location: [facility];  Service: Orthopedics;  Laterality: Right;   Patient Active Problem List   Diagnosis Date Noted   S/P lumbar fusion 02/18/2024   Mitral valve regurgitation 08/14/2021   Coronary artery calcification 08/14/2021   Aortic atherosclerosis (HCC) 08/14/2021   Hyperlipidemia 08/14/2021   Carotid artery disease (HCC) 08/14/2021   OA (osteoarthritis) of hip 02/12/2016    PCP: Bluford Main, MD  REFERRING PROVIDER: Arman Bogus, MD  REFERRING DIAG: M54.16 (ICD-10-CM) - Radiculopathy, lumbar region  Rationale for Evaluation and Treatment: Rehabilitation  THERAPY DIAG:  Radiculopathy, lumbar region  Muscle weakness (generalized)  Unsteadiness on feet  Dizziness and giddiness  Other abnormalities of gait and mobility  ONSET DATE: Surgery Date 02/18/2024  SUBJECTIVE:  SUBJECTIVE STATEMENT: Patient reports she is doing good today. She feels her dizziness is getting better.  PERTINENT HISTORY:  Depression; osteoporosis; vertigo; Meniere's disease; hx Rt total hip arthroplasty ; scoliosis   PAIN:  Are you having pain? Yes: NPRS scale: 2/ 10  Pain location: at incision site Pain description: throbbing; burning; sore Aggravating factors: continuous movement; reaching in cabinets; walking in grocery store  Relieving factors: laying down  PRECAUTIONS: Back lumbar fusion 3/7/225. Lifting < 10 lbs  RED FLAGS: None   WEIGHT BEARING RESTRICTIONS: No  FALLS:  Has patient fallen in last 6 months? No  LIVING ENVIRONMENT: Lives  with: lives alone Lives in: House/apartment Stairs: Yes: Internal: 1  steps; none Has following equipment at home: Single point cane  OCCUPATION: Retired  PLOF: Independent, Independent with basic ADLs, Independent with household mobility without device, and Independent with gait  PATIENT GOALS: Return to activities she does at her church; Return to [employer]; shop without issues;   NEXT MD VISIT: April 06, 2024  OBJECTIVE:  Note: Objective measures were completed at Evaluation unless otherwise noted.  DIAGNOSTIC FINDINGS:  12/29/2023 Lumbar CT without contrast  IMPRESSION: 1. Transitional lumbosacral anatomy. Designating normal lumbar segmentation results in hypoplastic ribs at L1. Correlation with radiographs is recommended prior to any operative intervention. 2. Levoconvex lumbar scoliosis with degenerative spondylolisthesis at L2-L3, L5-S1. Advanced endplate degeneration at the former, facet arthropathy at the latter. 3. No convincing spinal stenosis by CT. But moderate right lateral recess and severe right neural foraminal stenosis at L2-L3. Mild left lateral recess stenosis at L3-L4. 4.  Aortic Atherosclerosis (ICD10-I70.0).   PATIENT SURVEYS:  Eval:  Modified Oswestry 17/15 34%   COGNITION: Overall cognitive status: Within functional limits for tasks assessed     SENSATION: WFL   POSTURE: rounded shoulders  SKIN: Incision healing well; no warmth noted  LUMBAR ROM: NT due to post surgery   LOWER EXTREMITY ROM:   WFL   LOWER EXTREMITY MMT:  Grossly 4-/5   FUNCTIONAL TESTS:  Eval: 5 times sit to stand: 24.35 sec w. UE support Timed up and go (TUG): 27.10 sec with str cane. Pt experienced some dizziness after completing turn  03/21/2024: Modified CTSIB:  Condition 1- 30 sec (but self corrected loss of balance at 20 sec), Condition 2- 5 sec, Condition 3- 30 sec (with increased sway), Condition 4- 2 sec.  Total- 67/120    03/23/2024 6 MWT  with str cane:625ft- increased anterior Lt hip pain at 2 minute mark. Patient frequently touches walls when ambulating. She verbalized dizziness when people were walking around her.    GAIT: Distance walked: 67ft Assistive device utilized: Single point cane Level of assistance: Complete Independence Comments: decreased step length; patient experiences mild dizziness when turning  VESTIBULAR ASSESSMENT: 03/21/2024: Smooth pursuit:  nystagmus noted when looking to the right and up.  No nystagmus looking left Saccades:  WNL Gaze stabilization is WNL Head Impulse Test:  positive bilaterally, worse on right than left [place] negative bilaterally   TREATMENT DATE:  03/29/2024: Nustep level 4 x7  min with PT present to discuss status Sit to stand with UE crossed x5 Seated blue pball rollout 5x5 sec Single leg on airex standing shoulder rows with red tband 2x10 Single leg on airex standing shoulder extension with red tband 2x10 Standing paloff press with red tband x10 bilat Seated transversus abdominus contraction with pressing ball into thighs 2x10 Supine hamstring stretch with strap 2x20 sec bilat Standing at barre  on foam pad:  hip abduction and hip extension x10 each bilat Standing at barre on foam pad with eyes open 2x30 sec for balance (x1 narrow BOS x1 wide BOS) Standing at barre on foam pad: with eyes closed and close CGA 2x20 sec Manual: STM Lt gluteals for pain relief and improved circulation Moist heat to Lt gluteals at end of treatment session x 10 mins     03/27/2024: Nustep level 4 x7  min with PT present to discuss status Sit to stand with UE crossed x5 Seated blue pball rollout 5x5 sec Standing shoulder rows with red tband 2x10 Standing shoulder extension with red tband 2x10 Standing paloff press with red tband x10 bilat Seated transversus abdominus contraction with pressing ball into thighs 2x10 Supine hamstring stretch with strap 2x20 sec bilat Supine IT band  stretch with strap 2x20 sec bilat Standing at barre on foam pad:  hip abduction and hip extension x10 each bilat Standing at barre on foam pad with eyes open 2x30 sec for balance Standing at barre on foam pad: with eyes closed and close CGA 2x20 sec Marching on trampoline x1 min   03/23/2024: Nustep level 4 x7  min with PT present to discuss status (seat 6) 6 minutes walk test with str can: 620ft- increased anterior Lt hip pain at 2 minute mark. Patient frequently touches walls when ambulating. She verbalized dizziness when people were walking around her.  Marching on airex 2 x 10 bilateral  Hip abduction on airex 2 x 10 bilateral  Heel raises on airex 2 x 10 Standing shoulder extension with red TB 2 x 10 Standing shoulder row with/ red TB 2 x 10 Seated alt hand and knee press x 20 Supine transversus abdominus contraction with pressing hands into thighs x10 each  Supine hamstring stretch 2 x 30 sec bilateral  Supine ITB stretch 2 x 30 sec bilateral      PATIENT EDUCATION:  Education details: Spinal precautions; log roll; POC Person educated: Patient Education method: Explanation, Demonstration, and Handouts Education comprehension: verbalized understanding, returned demonstration, and needs further education  HOME EXERCISE PROGRAM: Access Code: [email or web address] URL: [email or web address] Date: 03/21/2024 Prepared by: Clydie Braun Menke  Exercises - Supine Transversus Abdominis Bracing - Hands on Stomach  - 1 x daily - 7 x weekly - 2 sets - 10 reps - 4 sec hold - Supine Diaphragmatic Breathing  - 1 x daily - 7 x weekly - 1 sets - 10 reps - Seated Long Arc Quad  - 1 x daily - 7 x weekly - 2 sets - 10 reps - 2 hold - Standing Heel Raise with Support  - 1 x daily - 7 x weekly - 2 sets - 10 reps - Seated Horizontal Smooth Pursuit  - 1 x daily - 7 x weekly - 2 sets - 10 reps - Seated Vertical Smooth Pursuit  - 1 x daily - 7 x weekly - 2 sets - 10 reps - Seated Proximal-Distal  Smooth Pursuit  - 1 x daily - 7 x weekly - 2 sets - 10 reps - Seated Gaze Stabilization with Head Rotation  - 1 x daily - 7 x weekly - 2 sets - 30 sec hold - Seated Gaze Stabilization with Head Nod  - 1 x daily - 7 x weekly - 2 sets - 30 sec hold  ASSESSMENT:  CLINICAL IMPRESSION: Rajanee verbalizes that her dizziness is improving with physical therapy. Incorporated more single leg balance on the airex which was a good  challenge for patient. Incorporated narrow and wide BOS on airex and noted increased sway with each condition. Patient has been having left sided hip pain that responded well to soft tissue techniques and moist heat. Educated patient in the benefit of dry needling and provided her a handout. Overall, patient tolerated treatment session well.  Patient will benefit from skilled PT to address the below impairments and improve overall function.      OBJECTIVE IMPAIRMENTS: Abnormal gait, decreased balance, decreased coordination, difficulty walking, decreased ROM, decreased strength, dizziness, increased muscle spasms, postural dysfunction, and pain.   ACTIVITY LIMITATIONS: carrying, lifting, bending, sitting, standing, squatting, transfers, and bed mobility  PARTICIPATION LIMITATIONS: cleaning, laundry, shopping, community activity, yard work, and church  PERSONAL FACTORS: Age, Fitness, and 1-2 comorbidities: Depression; osteoporosis  are also affecting patient's functional outcome.   REHAB POTENTIAL: Good  CLINICAL DECISION MAKING: Evolving/moderate complexity  EVALUATION COMPLEXITY: Moderate   GOALS: Goals reviewed with patient? Yes  SHORT TERM GOALS: Target date: 04/17/2024  Patient will be independent with initial HEP. Baseline:  Goal status: Met on 03/27/24  2.  Patient will report > or = to 40% improvement in functional activity since starting PT. Baseline:  Goal status: Met on 03/27/24  3.  Patient to undergo vestibular assessment by vestibular physical  therapist. Baseline:  Goal status: Met on 03/21/2024  4.  Patient will be able to perform STS with no UE support due to improved LE strength. Baseline:  Goal status: Met on 03/27/24   LONG TERM GOALS: Target date: 05/15/2024 Patient will demonstrate independence in advanced HEP. Baseline:  Goal status: Ongoing  2.  Patient will demonstrate improved walking tolerance and be able to grocery shop with minimal limitations due to her back pain. Baseline:  Goal status: INITIAL  3.  Patient will verbalize and demonstrate self-care strategies to manage pain including tissue mobility practices and change of position. Baseline:  Goal status: INITIAL  3.  Patient will reports > or = 70% improvement in her dizziness since starting PT. Baseline:  Goal status: INITIAL  4.  Patient will be able to return to singing in her church choir and [employer].  Baseline:  Goal status: INITIAL  5.  Patient will perform tug in < or = to 17 sec for decreased falls risk.  Baseline: 27.10sec Goal status: INITIAL    PLAN:  PT FREQUENCY: 2x/week  PT DURATION: 8 weeks  PLANNED INTERVENTIONS: 97164- PT Re-evaluation, 97110-Therapeutic exercises, 97530- Therapeutic activity, 97112- Neuromuscular re-education, 97535- Self Care, [id number]- Manual therapy, [phone]- Gait training, [phone]- Orthotic Fit/training, [phone]- Canalith repositioning, [id number]- Aquatic Therapy, [phone]- Electrical stimulation (unattended), 97016- Vasopneumatic device, [id number]- Ultrasound, [id number]- Ionotophoresis 4mg /ml Dexamethasone, Patient/Family education, Balance training, Stair training, Taping, Dry Needling, Joint mobilization, Joint manipulation, Vestibular training, Cryotherapy, and Moist heat.  PLAN FOR NEXT SESSION: assess left hip pain; possible dry needling if patient is interested; continue balance and core strengthening   Penelope Bowie, PT 03/29/24 3:13 PM [facility] [address] [place], [place] [address] Phone # [phone] Fax [phone]

## 2024-03-29 NOTE — Telephone Encounter (Signed)
 Echo scheduled for 8/27

## 2024-04-03 ENCOUNTER — Ambulatory Visit: Admitting: Physical Therapy

## 2024-04-03 ENCOUNTER — Encounter: Payer: Self-pay | Admitting: Physical Therapy

## 2024-04-03 DIAGNOSIS — R2681 Unsteadiness on feet: Secondary | ICD-10-CM

## 2024-04-03 DIAGNOSIS — R2689 Other abnormalities of gait and mobility: Secondary | ICD-10-CM

## 2024-04-03 DIAGNOSIS — M6281 Muscle weakness (generalized): Secondary | ICD-10-CM

## 2024-04-03 DIAGNOSIS — R42 Dizziness and giddiness: Secondary | ICD-10-CM

## 2024-04-03 DIAGNOSIS — M5416 Radiculopathy, lumbar region: Secondary | ICD-10-CM

## 2024-04-03 NOTE — Therapy (Signed)
 OUTPATIENT PHYSICAL THERAPY VESTIBULAR / LOW BACK TREATMENT   Patient Name: Lori Frost MRN: [id number] DOB:04-Jul-1946, 78 y.o., female Today's Date: 04/03/2024  END OF SESSION:  PT End of Session - 04/03/24 1104     Visit Number 6    Date for PT Re-Evaluation 05/15/24    Authorization Type HTA    Progress Note Due on Visit 10    PT Start Time 1015    PT Stop Time 1058    PT Time Calculation (min) 43 min    Activity Tolerance Patient tolerated treatment well    Behavior During Therapy WFL for tasks assessed/performed                Past Medical History:  Diagnosis Date   Allergy    Non-Allergic Rhinitis   Balance problem due to vestibular dysfunction    Bloating    CAD (coronary artery disease)    Carotid artery occlusion    Cataract    Chordae tendineae rupture (HCC)    Cyst of ovary, right    Depression    Diverticulosis    DJD (degenerative joint disease)    Eczema    Family history of adverse reaction to anesthesia    SISTER "HEART STOPPED DURING [id number]"2725 UNKNOWN CAUSE -  SHE DIED 10 YRS LATER OF HEART ATTACK   GERD (gastroesophageal reflux disease)    ESOPHAGITIS PRESENCE NOT SPECIFIED   Granuloma annulare    History of hiatal hernia    History of recurrent UTI (urinary tract infection)    Hypercholesterolemia    Hyperlipidemia    Lower back pain    Meniere's disease    Migraine    Mitral valve regurgitation    MODERATE NORMAL, MILD AORTIC REGURGITATION, MILD TRICUSPID REGURGITATION, LVH, GRADE 1 DIASTOLIC DYSFUNCTION, ECHO 2018 SUGGESTS RUPTURED CHORDAE TENDINEAE ( DR. Anastasia Balo)   MVP (mitral valve prolapse)    Myalgia    OA (osteoarthritis) of hip 02/12/2016   Osteoporosis    Other nonrheumatic mitral valve disorders    Psoriasis    Pupil dilated    DUE TO EYE DROPS FOR TX OF RT  EYE PROBLEM   Reactive hypoglycemia    Sensitive skin    Shingles    Vertigo    Vestibular neuronitis    Vitamin D deficiency    Past Surgical History:   Procedure Laterality Date   EYE SURGERY Right 02-05-15   Cataract   EYE SURGERY Left 02-21-15   Cataract   TONSILLECTOMY     TOTAL HIP ARTHROPLASTY Right 02/12/2016   Procedure: RIGHT TOTAL HIP ARTHROPLASTY ANTERIOR APPROACH;  Surgeon: Liliane Rei, MD;  Location: [facility];  Service: Orthopedics;  Laterality: Right;   Patient Active Problem List   Diagnosis Date Noted   S/P lumbar fusion 02/18/2024   Mitral valve regurgitation 08/14/2021   Coronary artery calcification 08/14/2021   Aortic atherosclerosis (HCC) 08/14/2021   Hyperlipidemia 08/14/2021   Carotid artery disease (HCC) 08/14/2021   OA (osteoarthritis) of hip 02/12/2016    PCP: Elner Hahn, MD  REFERRING PROVIDER: Joaquin Mulberry, MD  REFERRING DIAG: M54.16 (ICD-10-CM) - Radiculopathy, lumbar region  Rationale for Evaluation and Treatment: Rehabilitation  THERAPY DIAG:  Radiculopathy, lumbar region  Muscle weakness (generalized)  Unsteadiness on feet  Dizziness and giddiness  Other abnormalities of gait and mobility  ONSET DATE: Surgery Date 02/18/2024  SUBJECTIVE:  SUBJECTIVE STATEMENT: Patient reports she is doing good today. She is still having occasional right hip pain but the massage gun and heat helped last session. PERTINENT HISTORY:  Depression; osteoporosis; vertigo; Meniere's disease; hx Rt total hip arthroplasty ; scoliosis   PAIN: 04/03/2024 Are you having pain? Yes: NPRS scale: 1.5/ 10  Pain location: at incision site Pain description: throbbing; burning; sore Aggravating factors: continuous movement; reaching in cabinets; walking in grocery store  Relieving factors: laying down  PRECAUTIONS: Back lumbar fusion 3/7/225. Lifting < 10 lbs  RED FLAGS: None   WEIGHT BEARING RESTRICTIONS: No  FALLS:  Has  patient fallen in last 6 months? No  LIVING ENVIRONMENT: Lives with: lives alone Lives in: House/apartment Stairs: Yes: Internal: 1  steps; none Has following equipment at home: Single point cane  OCCUPATION: Retired  PLOF: Independent, Independent with basic ADLs, Independent with household mobility without device, and Independent with gait  PATIENT GOALS: Return to activities she does at her church; Return to [employer]; shop without issues;   NEXT MD VISIT: April 06, 2024  OBJECTIVE:  Note: Objective measures were completed at Evaluation unless otherwise noted.  DIAGNOSTIC FINDINGS:  12/29/2023 Lumbar CT without contrast  IMPRESSION: 1. Transitional lumbosacral anatomy. Designating normal lumbar segmentation results in hypoplastic ribs at L1. Correlation with radiographs is recommended prior to any operative intervention. 2. Levoconvex lumbar scoliosis with degenerative spondylolisthesis at L2-L3, L5-S1. Advanced endplate degeneration at the former, facet arthropathy at the latter. 3. No convincing spinal stenosis by CT. But moderate right lateral recess and severe right neural foraminal stenosis at L2-L3. Mild left lateral recess stenosis at L3-L4. 4.  Aortic Atherosclerosis (ICD10-I70.0).   PATIENT SURVEYS:  Eval:  Modified Oswestry 17/15 34%   COGNITION: Overall cognitive status: Within functional limits for tasks assessed     SENSATION: WFL   POSTURE: rounded shoulders  SKIN: Incision healing well; no warmth noted  LUMBAR ROM: NT due to post surgery   LOWER EXTREMITY ROM:   WFL   LOWER EXTREMITY MMT:  Grossly 4-/5   FUNCTIONAL TESTS:  Eval: 5 times sit to stand: 24.35 sec w. UE support Timed up and go (TUG): 27.10 sec with str cane. Pt experienced some dizziness after completing turn  03/21/2024: Modified CTSIB:  Condition 1- 30 sec (but self corrected loss of balance at 20 sec), Condition 2- 5 sec, Condition 3- 30 sec (with increased  sway), Condition 4- 2 sec.  Total- 67/120    03/23/2024 6 MWT with str cane:669ft- increased anterior Lt hip pain at 2 minute mark. Patient frequently touches walls when ambulating. She verbalized dizziness when people were walking around her.    GAIT: Distance walked: 38ft Assistive device utilized: Single point cane Level of assistance: Complete Independence Comments: decreased step length; patient experiences mild dizziness when turning  VESTIBULAR ASSESSMENT: 03/21/2024: Smooth pursuit:  nystagmus noted when looking to the right and up.  No nystagmus looking left Saccades:  WNL Gaze stabilization is WNL Head Impulse Test:  positive bilaterally, worse on right than left [place] negative bilaterally   TREATMENT DATE:  04/03/2024: Nustep level 4 x7  min with PT present to discuss status Standing marching and hip abduction on airex 2.5# AW 2 x 10 bilateral  Seated transversus abdominus contraction with pressing ball into thighs 2x10 Standing paloff press with red long loop x10 bilat Iso shoulder extension with red TB + march 2 x 10 Standing on airex 5# KB around the worlds x 10 each  direction Sit to stand with UE crossed x10 Step ups at stairs unilateral 5# KB hold x 20 bilateral  Manual: STM Lt gluteals for pain relief and improved circulation    03/29/2024: Nustep level 4 x7  min with PT present to discuss status Sit to stand with UE crossed x5 Seated blue pball rollout 5x5 sec Single leg on airex standing shoulder rows with red tband 2x10 Single leg on airex standing shoulder extension with red tband 2x10 Standing paloff press with red tband x10 bilat Seated transversus abdominus contraction with pressing ball into thighs 2x10 Supine hamstring stretch with strap 2x20 sec bilat Standing at barre on foam pad:  hip abduction and hip extension x10 each bilat Standing at barre on foam pad with eyes open 2x30 sec for balance (x1 narrow BOS x1 wide BOS) Standing at barre on  foam pad: with eyes closed and close CGA 2x20 sec Manual: STM Lt gluteals for pain relief and improved circulation Moist heat to Lt gluteals at end of treatment session x 10 mins     03/27/2024: Nustep level 4 x7  min with PT present to discuss status Sit to stand with UE crossed x5 Seated blue pball rollout 5x5 sec Standing shoulder rows with red tband 2x10 Standing shoulder extension with red tband 2x10 Standing paloff press with red tband x10 bilat Seated transversus abdominus contraction with pressing ball into thighs 2x10 Supine hamstring stretch with strap 2x20 sec bilat Supine IT band stretch with strap 2x20 sec bilat Standing at barre on foam pad:  hip abduction and hip extension x10 each bilat Standing at barre on foam pad with eyes open 2x30 sec for balance Standing at barre on foam pad: with eyes closed and close CGA 2x20 sec Marching on trampoline x1 min       PATIENT EDUCATION:  Education details: Spinal precautions; log roll; POC Person educated: Patient Education method: Explanation, Demonstration, and Handouts Education comprehension: verbalized understanding, returned demonstration, and needs further education  HOME EXERCISE PROGRAM: Access Code: [email or web address] URL: [email or web address] Date: 03/21/2024 Prepared by: Chaneta Comer Menke  Exercises - Supine Transversus Abdominis Bracing - Hands on Stomach  - 1 x daily - 7 x weekly - 2 sets - 10 reps - 4 sec hold - Supine Diaphragmatic Breathing  - 1 x daily - 7 x weekly - 1 sets - 10 reps - Seated Long Arc Quad  - 1 x daily - 7 x weekly - 2 sets - 10 reps - 2 hold - Standing Heel Raise with Support  - 1 x daily - 7 x weekly - 2 sets - 10 reps - Seated Horizontal Smooth Pursuit  - 1 x daily - 7 x weekly - 2 sets - 10 reps - Seated Vertical Smooth Pursuit  - 1 x daily - 7 x weekly - 2 sets - 10 reps - Seated Proximal-Distal Smooth Pursuit  - 1 x daily - 7 x weekly - 2 sets - 10 reps - Seated Gaze  Stabilization with Head Rotation  - 1 x daily - 7 x weekly - 2 sets - 30 sec hold - Seated Gaze Stabilization with Head Nod  - 1 x daily - 7 x weekly - 2 sets - 30 sec hold  ASSESSMENT:  CLINICAL IMPRESSION: Lori Frost presented to therapy today with no increased pain or discomfort. She verbalized that the massage gun and heat was effective at relieving her right hip pain. Progressed core exercises today and patient required verbal and visual cues for correct  performance. Overall, patient tolerated treatment session well. Patient verbalized that her left hip feels weaker with the right and it was more challenging on the left side when performing pallof press. Patient is not interested in doing dry needling at this time. Patient will benefit from skilled PT to address the below impairments and improve overall function.       OBJECTIVE IMPAIRMENTS: Abnormal gait, decreased balance, decreased coordination, difficulty walking, decreased ROM, decreased strength, dizziness, increased muscle spasms, postural dysfunction, and pain.   ACTIVITY LIMITATIONS: carrying, lifting, bending, sitting, standing, squatting, transfers, and bed mobility  PARTICIPATION LIMITATIONS: cleaning, laundry, shopping, community activity, yard work, and church  PERSONAL FACTORS: Age, Fitness, and 1-2 comorbidities: Depression; osteoporosis  are also affecting patient's functional outcome.   REHAB POTENTIAL: Good  CLINICAL DECISION MAKING: Evolving/moderate complexity  EVALUATION COMPLEXITY: Moderate   GOALS: Goals reviewed with patient? Yes  SHORT TERM GOALS: Target date: 04/17/2024  Patient will be independent with initial HEP. Baseline:  Goal status: Met on 03/27/24  2.  Patient will report > or = to 40% improvement in functional activity since starting PT. Baseline:  Goal status: Met on 03/27/24  3.  Patient to undergo vestibular assessment by vestibular physical therapist. Baseline:  Goal status: Met on  03/21/2024  4.  Patient will be able to perform STS with no UE support due to improved LE strength. Baseline:  Goal status: Met on 03/27/24   LONG TERM GOALS: Target date: 05/15/2024 Patient will demonstrate independence in advanced HEP. Baseline:  Goal status: Ongoing  2.  Patient will demonstrate improved walking tolerance and be able to grocery shop with minimal limitations due to her back pain. Baseline:  Goal status: INITIAL  3.  Patient will verbalize and demonstrate self-care strategies to manage pain including tissue mobility practices and change of position. Baseline:  Goal status: INITIAL  3.  Patient will reports > or = 70% improvement in her dizziness since starting PT. Baseline:  Goal status: INITIAL  4.  Patient will be able to return to singing in her church choir and [employer].  Baseline:  Goal status: INITIAL  5.  Patient will perform tug in < or = to 17 sec for decreased falls risk.  Baseline: 27.10sec Goal status: INITIAL    PLAN:  PT FREQUENCY: 2x/week  PT DURATION: 8 weeks  PLANNED INTERVENTIONS: 97164- PT Re-evaluation, 97110-Therapeutic exercises, 97530- Therapeutic activity, 97112- Neuromuscular re-education, 97535- Self Care, 40981- Manual therapy, [phone]- Gait training, [phone]- Orthotic Fit/training, [phone]- Canalith repositioning, [id number]- Aquatic Therapy, [phone]- Electrical stimulation (unattended), 97016- Vasopneumatic device, [id number]- Ultrasound, [id number]- Ionotophoresis 4mg /ml Dexamethasone , Patient/Family education, Balance training, Stair training, Taping, Dry Needling, Joint mobilization, Joint manipulation, Vestibular training, Cryotherapy, and Moist heat.  PLAN FOR NEXT SESSION: assess tolerance to treatment session; continue functional & core strengthening    Penelope Bowie, PT 04/03/24 11:05 AM [facility] [address] [place], [place] [address] Phone # [phone] Fax [phone]

## 2024-04-05 ENCOUNTER — Encounter: Payer: Self-pay | Admitting: Rehabilitative and Restorative Service Providers"

## 2024-04-05 ENCOUNTER — Ambulatory Visit: Admitting: Rehabilitative and Restorative Service Providers"

## 2024-04-05 DIAGNOSIS — R2681 Unsteadiness on feet: Secondary | ICD-10-CM

## 2024-04-05 DIAGNOSIS — R2689 Other abnormalities of gait and mobility: Secondary | ICD-10-CM

## 2024-04-05 DIAGNOSIS — R42 Dizziness and giddiness: Secondary | ICD-10-CM

## 2024-04-05 DIAGNOSIS — M6281 Muscle weakness (generalized): Secondary | ICD-10-CM

## 2024-04-05 DIAGNOSIS — M5416 Radiculopathy, lumbar region: Secondary | ICD-10-CM

## 2024-04-05 NOTE — Therapy (Signed)
 OUTPATIENT PHYSICAL THERAPY VESTIBULAR / LOW BACK TREATMENT   Patient Name: CAMELLE HENKELS MRN: [id number] DOB:08-05-1946, 78 y.o., female Today's Date: 04/05/2024  END OF SESSION:  PT End of Session - 04/05/24 0934     Visit Number 7    Date for PT Re-Evaluation 05/15/24    Authorization Type HTA    Progress Note Due on Visit 10    PT Start Time [phone]    PT Stop Time 1010    PT Time Calculation (min) 39 min    Activity Tolerance Patient tolerated treatment well    Behavior During Therapy WFL for tasks assessed/performed                Past Medical History:  Diagnosis Date   Allergy    Non-Allergic Rhinitis   Balance problem due to vestibular dysfunction    Bloating    CAD (coronary artery disease)    Carotid artery occlusion    Cataract    Chordae tendineae rupture (HCC)    Cyst of ovary, right    Depression    Diverticulosis    DJD (degenerative joint disease)    Eczema    Family history of adverse reaction to anesthesia    SISTER "HEART STOPPED DURING [id number] UNKNOWN CAUSE -  SHE DIED 10 YRS LATER OF HEART ATTACK   GERD (gastroesophageal reflux disease)    ESOPHAGITIS PRESENCE NOT SPECIFIED   Granuloma annulare    History of hiatal hernia    History of recurrent UTI (urinary tract infection)    Hypercholesterolemia    Hyperlipidemia    Lower back pain    Meniere's disease    Migraine    Mitral valve regurgitation    MODERATE NORMAL, MILD AORTIC REGURGITATION, MILD TRICUSPID REGURGITATION, LVH, GRADE 1 DIASTOLIC DYSFUNCTION, ECHO 2018 SUGGESTS RUPTURED CHORDAE TENDINEAE ( DR. Anastasia Balo)   MVP (mitral valve prolapse)    Myalgia    OA (osteoarthritis) of hip 02/12/2016   Osteoporosis    Other nonrheumatic mitral valve disorders    Psoriasis    Pupil dilated    DUE TO EYE DROPS FOR TX OF RT  EYE PROBLEM   Reactive hypoglycemia    Sensitive skin    Shingles    Vertigo    Vestibular neuronitis    Vitamin D deficiency    Past Surgical History:   Procedure Laterality Date   EYE SURGERY Right 02-05-15   Cataract   EYE SURGERY Left 02-21-15   Cataract   TONSILLECTOMY     TOTAL HIP ARTHROPLASTY Right 02/12/2016   Procedure: RIGHT TOTAL HIP ARTHROPLASTY ANTERIOR APPROACH;  Surgeon: Liliane Rei, MD;  Location: [facility];  Service: Orthopedics;  Laterality: Right;   Patient Active Problem List   Diagnosis Date Noted   S/P lumbar fusion 02/18/2024   Mitral valve regurgitation 08/14/2021   Coronary artery calcification 08/14/2021   Aortic atherosclerosis (HCC) 08/14/2021   Hyperlipidemia 08/14/2021   Carotid artery disease (HCC) 08/14/2021   OA (osteoarthritis) of hip 02/12/2016    PCP: Elner Hahn, MD  REFERRING PROVIDER: Joaquin Mulberry, MD  REFERRING DIAG: M54.16 (ICD-10-CM) - Radiculopathy, lumbar region  Rationale for Evaluation and Treatment: Rehabilitation  THERAPY DIAG:  Radiculopathy, lumbar region  Muscle weakness (generalized)  Unsteadiness on feet  Dizziness and giddiness  Other abnormalities of gait and mobility  ONSET DATE: Surgery Date 02/18/2024  SUBJECTIVE:  SUBJECTIVE STATEMENT: Patient states that she went out to eat and shopping with a friend yesterday for her birthday.  States that she used her cane and did well.  Patient reports that she did not have any increased pain after all of that activity yesterday.  Reports that her session on Monday was good.  PERTINENT HISTORY:  Depression; osteoporosis; vertigo; Meniere's disease; hx Rt total hip arthroplasty ; scoliosis   PAIN:  Are you having pain? Yes: NPRS scale: 1/ 10  Pain location: at incision site Pain description: throbbing; burning; sore Aggravating factors: continuous movement; reaching in cabinets; walking in grocery store  Relieving factors: laying  down  PRECAUTIONS: Back lumbar fusion 3/7/225. Lifting < 10 lbs  RED FLAGS: None   WEIGHT BEARING RESTRICTIONS: No  FALLS:  Has patient fallen in last 6 months? No  LIVING ENVIRONMENT: Lives with: lives alone Lives in: House/apartment Stairs: Yes: Internal: 1  steps; none Has following equipment at home: Single point cane  OCCUPATION: Retired  PLOF: Independent, Independent with basic ADLs, Independent with household mobility without device, and Independent with gait  PATIENT GOALS: Return to activities she does at her church; Return to [employer]; shop without issues;   NEXT MD VISIT: April 06, 2024  OBJECTIVE:  Note: Objective measures were completed at Evaluation unless otherwise noted.  DIAGNOSTIC FINDINGS:  12/29/2023 Lumbar CT without contrast  IMPRESSION: 1. Transitional lumbosacral anatomy. Designating normal lumbar segmentation results in hypoplastic ribs at L1. Correlation with radiographs is recommended prior to any operative intervention. 2. Levoconvex lumbar scoliosis with degenerative spondylolisthesis at L2-L3, L5-S1. Advanced endplate degeneration at the former, facet arthropathy at the latter. 3. No convincing spinal stenosis by CT. But moderate right lateral recess and severe right neural foraminal stenosis at L2-L3. Mild left lateral recess stenosis at L3-L4. 4.  Aortic Atherosclerosis (ICD10-I70.0).   PATIENT SURVEYS:  Eval:  Modified Oswestry 17/15 34%   COGNITION: Overall cognitive status: Within functional limits for tasks assessed     SENSATION: WFL   POSTURE: rounded shoulders  SKIN: Incision healing well; no warmth noted  LUMBAR ROM: NT due to post surgery   LOWER EXTREMITY ROM:   WFL   LOWER EXTREMITY MMT:  Grossly 4-/5   FUNCTIONAL TESTS:  Eval: 5 times sit to stand: 24.35 sec w. UE support Timed up and go (TUG): 27.10 sec with str cane. Pt experienced some dizziness after completing  turn  03/21/2024: Modified CTSIB:  Condition 1- 30 sec (but self corrected loss of balance at 20 sec), Condition 2- 5 sec, Condition 3- 30 sec (with increased sway), Condition 4- 2 sec.  Total- 67/120    03/23/2024 6 MWT with str cane:643ft- increased anterior Lt hip pain at 2 minute mark. Patient frequently touches walls when ambulating. She verbalized dizziness when people were walking around her.    GAIT: Distance walked: 7ft Assistive device utilized: Single point cane Level of assistance: Complete Independence Comments: decreased step length; patient experiences mild dizziness when turning  VESTIBULAR ASSESSMENT: 03/21/2024: Smooth pursuit:  nystagmus noted when looking to the right and up.  No nystagmus looking left Saccades:  WNL Gaze stabilization is WNL Head Impulse Test:  positive bilaterally, worse on right than left [place] negative bilaterally   TREATMENT DATE:  04/05/2024: Nustep level 5 x6  min with PT present to discuss status Tandem walking on AirEx bean beside counter with UE support down and back x3 laps Side stepping on AirEx bean beside counter with UE support  down and back x3 laps Standing on foam pad with eyes closed in parallel bars with CGA/min A from PT 2x30 sec Standing ball toss x20 tosses (in parallel bars for support, if needed) Standing on foam pad in parallel bars:  rows and shoulder extension with yellow tband.  2x10 each Standing on airex 5# kettlebell around the worlds x 10 each direction Seated transversus abdominus contraction with pressing ball into thighs 2x10 Seated on green physioball (PT immediately seated behind patient and patient with hand support of PT mat beside patient):  marching 2x10, LAQ x10 bilat, shoulder flexion with 1# dumbbells 2x10.   04/03/2024: Nustep level 4 x7  min with PT present to discuss status Standing marching and hip abduction on airex 2.5# AW 2 x 10 bilateral  Seated transversus abdominus contraction with  pressing ball into thighs 2x10 Standing paloff press with red long loop x10 bilat Iso shoulder extension with red TB + march 2 x 10 Standing on airex 5# KB around the worlds x 10 each direction Sit to stand with UE crossed x10 Step ups at stairs unilateral 5# KB hold x 20 bilateral  Manual: STM Lt gluteals for pain relief and improved circulation    03/29/2024: Nustep level 4 x7  min with PT present to discuss status Sit to stand with UE crossed x5 Seated blue pball rollout 5x5 sec Single leg on airex standing shoulder rows with red tband 2x10 Single leg on airex standing shoulder extension with red tband 2x10 Standing paloff press with red tband x10 bilat Seated transversus abdominus contraction with pressing ball into thighs 2x10 Supine hamstring stretch with strap 2x20 sec bilat Standing at barre on foam pad:  hip abduction and hip extension x10 each bilat Standing at barre on foam pad with eyes open 2x30 sec for balance (x1 narrow BOS x1 wide BOS) Standing at barre on foam pad: with eyes closed and close CGA 2x20 sec Manual: STM Lt gluteals for pain relief and improved circulation Moist heat to Lt gluteals at end of treatment session x 10 mins    PATIENT EDUCATION:  Education details: Spinal precautions; log roll; POC Person educated: Patient Education method: Explanation, Demonstration, and Handouts Education comprehension: verbalized understanding, returned demonstration, and needs further education  HOME EXERCISE PROGRAM: Access Code: [email or web address] URL: [email or web address] Date: 03/21/2024 Prepared by: Chaneta Comer Mikayah Joy  Exercises - Supine Transversus Abdominis Bracing - Hands on Stomach  - 1 x daily - 7 x weekly - 2 sets - 10 reps - 4 sec hold - Supine Diaphragmatic Breathing  - 1 x daily - 7 x weekly - 1 sets - 10 reps - Seated Long Arc Quad  - 1 x daily - 7 x weekly - 2 sets - 10 reps - 2 hold - Standing Heel Raise with Support  - 1 x daily - 7 x weekly - 2  sets - 10 reps - Seated Horizontal Smooth Pursuit  - 1 x daily - 7 x weekly - 2 sets - 10 reps - Seated Vertical Smooth Pursuit  - 1 x daily - 7 x weekly - 2 sets - 10 reps - Seated Proximal-Distal Smooth Pursuit  - 1 x daily - 7 x weekly - 2 sets - 10 reps - Seated Gaze Stabilization with Head Rotation  - 1 x daily - 7 x weekly - 2 sets - 30 sec hold - Seated Gaze Stabilization with Head Nod  - 1 x daily - 7 x weekly - 2 sets - 30 sec  hold  ASSESSMENT:  CLINICAL IMPRESSION: Ms Citron presents to skilled PT reporting that she did well walking around yesterday shopping.  Patient states that overall, her dizziness continues to be better than immediately after surgery.  Patient able to initiate balance exercises seated on physioball today, but requires min A from PT to assist with balance/stability.  Patient continues to progress with functional strengthening and core stability throughout.  Able to perform rows and shoulder extension on foam pad.  Patient continues to require skilled PT to progress towards goal related activities.    OBJECTIVE IMPAIRMENTS: Abnormal gait, decreased balance, decreased coordination, difficulty walking, decreased ROM, decreased strength, dizziness, increased muscle spasms, postural dysfunction, and pain.   ACTIVITY LIMITATIONS: carrying, lifting, bending, sitting, standing, squatting, transfers, and bed mobility  PARTICIPATION LIMITATIONS: cleaning, laundry, shopping, community activity, yard work, and church  PERSONAL FACTORS: Age, Fitness, and 1-2 comorbidities: Depression; osteoporosis  are also affecting patient's functional outcome.   REHAB POTENTIAL: Good  CLINICAL DECISION MAKING: Evolving/moderate complexity  EVALUATION COMPLEXITY: Moderate   GOALS: Goals reviewed with patient? Yes  SHORT TERM GOALS: Target date: 04/17/2024  Patient will be independent with initial HEP. Baseline:  Goal status: Met on 03/27/24  2.  Patient will report > or = to 40%  improvement in functional activity since starting PT. Baseline:  Goal status: Met on 03/27/24  3.  Patient to undergo vestibular assessment by vestibular physical therapist. Baseline:  Goal status: Met on 03/21/2024  4.  Patient will be able to perform STS with no UE support due to improved LE strength. Baseline:  Goal status: Met on 03/27/24   LONG TERM GOALS: Target date: 05/15/2024 Patient will demonstrate independence in advanced HEP. Baseline:  Goal status: Ongoing  2.  Patient will demonstrate improved walking tolerance and be able to grocery shop with minimal limitations due to her back pain. Baseline:  Goal status: Ongoing  3.  Patient will verbalize and demonstrate self-care strategies to manage pain including tissue mobility practices and change of position. Baseline:  Goal status: INITIAL  3.  Patient will reports > or = 70% improvement in her dizziness since starting PT. Baseline:  Goal status: INITIAL  4.  Patient will be able to return to singing in her church choir and [employer].  Baseline:  Goal status: INITIAL  5.  Patient will perform tug in < or = to 17 sec for decreased falls risk.  Baseline: 27.10sec Goal status: INITIAL    PLAN:  PT FREQUENCY: 2x/week  PT DURATION: 8 weeks  PLANNED INTERVENTIONS: 97164- PT Re-evaluation, 97110-Therapeutic exercises, 97530- Therapeutic activity, 97112- Neuromuscular re-education, 97535- Self Care, 16109- Manual therapy, [phone]- Gait training, [phone]- Orthotic Fit/training, [phone]- Canalith repositioning, [id number]- Aquatic Therapy, [phone]- Electrical stimulation (unattended), 97016- Vasopneumatic device, [id number]- Ultrasound, [id number]- Ionotophoresis 4mg /ml Dexamethasone , Patient/Family education, Balance training, Stair training, Taping, Dry Needling, Joint mobilization, Joint manipulation, Vestibular training, Cryotherapy, and Moist heat.  PLAN FOR NEXT SESSION: assess tolerance to treatment session; continue functional  & core strengthening    Robyne Christen, PT, DPT 04/05/24, 10:21 AM  [facility] [address] [place], [place] [address] Phone # [phone] Fax [phone]

## 2024-04-06 DIAGNOSIS — M5416 Radiculopathy, lumbar region: Secondary | ICD-10-CM | POA: Diagnosis not present

## 2024-04-07 DIAGNOSIS — H04123 Dry eye syndrome of bilateral lacrimal glands: Secondary | ICD-10-CM | POA: Diagnosis not present

## 2024-04-07 DIAGNOSIS — B309 Viral conjunctivitis, unspecified: Secondary | ICD-10-CM | POA: Diagnosis not present

## 2024-04-07 DIAGNOSIS — Z961 Presence of intraocular lens: Secondary | ICD-10-CM | POA: Diagnosis not present

## 2024-04-10 ENCOUNTER — Encounter: Payer: Self-pay | Admitting: Physical Therapy

## 2024-04-10 ENCOUNTER — Ambulatory Visit: Admitting: Physical Therapy

## 2024-04-10 DIAGNOSIS — R42 Dizziness and giddiness: Secondary | ICD-10-CM

## 2024-04-10 DIAGNOSIS — M6281 Muscle weakness (generalized): Secondary | ICD-10-CM

## 2024-04-10 DIAGNOSIS — R2689 Other abnormalities of gait and mobility: Secondary | ICD-10-CM

## 2024-04-10 DIAGNOSIS — M5416 Radiculopathy, lumbar region: Secondary | ICD-10-CM

## 2024-04-10 DIAGNOSIS — R2681 Unsteadiness on feet: Secondary | ICD-10-CM

## 2024-04-10 NOTE — Therapy (Signed)
 OUTPATIENT PHYSICAL THERAPY VESTIBULAR / LOW BACK TREATMENT   Patient Name: Lori Frost MRN: [id number] DOB:24-Jun-1946, 78 y.o., female Today's Date: 04/10/2024  END OF SESSION:  PT End of Session - 04/10/24 1131     Visit Number 8    Date for PT Re-Evaluation 05/15/24    Authorization Type HTA    Progress Note Due on Visit 10    PT Start Time 1015    PT Stop Time 1056    PT Time Calculation (min) 41 min    Activity Tolerance Patient tolerated treatment well    Behavior During Therapy WFL for tasks assessed/performed                 Past Medical History:  Diagnosis Date   Allergy    Non-Allergic Rhinitis   Balance problem due to vestibular dysfunction    Bloating    CAD (coronary artery disease)    Carotid artery occlusion    Cataract    Chordae tendineae rupture (HCC)    Cyst of ovary, right    Depression    Diverticulosis    DJD (degenerative joint disease)    Eczema    Family history of adverse reaction to anesthesia    SISTER "HEART STOPPED DURING [id number] UNKNOWN CAUSE -  SHE DIED 10 YRS LATER OF HEART ATTACK   GERD (gastroesophageal reflux disease)    ESOPHAGITIS PRESENCE NOT SPECIFIED   Granuloma annulare    History of hiatal hernia    History of recurrent UTI (urinary tract infection)    Hypercholesterolemia    Hyperlipidemia    Lower back pain    Meniere's disease    Migraine    Mitral valve regurgitation    MODERATE NORMAL, MILD AORTIC REGURGITATION, MILD TRICUSPID REGURGITATION, LVH, GRADE 1 DIASTOLIC DYSFUNCTION, ECHO 2018 SUGGESTS RUPTURED CHORDAE TENDINEAE ( DR. Anastasia Balo)   MVP (mitral valve prolapse)    Myalgia    OA (osteoarthritis) of hip 02/12/2016   Osteoporosis    Other nonrheumatic mitral valve disorders    Psoriasis    Pupil dilated    DUE TO EYE DROPS FOR TX OF RT  EYE PROBLEM   Reactive hypoglycemia    Sensitive skin    Shingles    Vertigo    Vestibular neuronitis    Vitamin D deficiency    Past Surgical History:   Procedure Laterality Date   EYE SURGERY Right 02-05-15   Cataract   EYE SURGERY Left 02-21-15   Cataract   TONSILLECTOMY     TOTAL HIP ARTHROPLASTY Right 02/12/2016   Procedure: RIGHT TOTAL HIP ARTHROPLASTY ANTERIOR APPROACH;  Surgeon: Liliane Rei, MD;  Location: [facility];  Service: Orthopedics;  Laterality: Right;   Patient Active Problem List   Diagnosis Date Noted   S/P lumbar fusion 02/18/2024   Mitral valve regurgitation 08/14/2021   Coronary artery calcification 08/14/2021   Aortic atherosclerosis (HCC) 08/14/2021   Hyperlipidemia 08/14/2021   Carotid artery disease (HCC) 08/14/2021   OA (osteoarthritis) of hip 02/12/2016    PCP: Elner Hahn, MD  REFERRING PROVIDER: Joaquin Mulberry, MD  REFERRING DIAG: M54.16 (ICD-10-CM) - Radiculopathy, lumbar region  Rationale for Evaluation and Treatment: Rehabilitation  THERAPY DIAG:  Radiculopathy, lumbar region  Muscle weakness (generalized)  Unsteadiness on feet  Dizziness and giddiness  Other abnormalities of gait and mobility  ONSET DATE: Surgery Date 02/18/2024  SUBJECTIVE:  SUBJECTIVE STATEMENT: Patient reports she is doing good today. She feels her balance is okay. She has been trying to walk without holding onto the wall.  PERTINENT HISTORY:  Depression; osteoporosis; vertigo; Meniere's disease; hx Rt total hip arthroplasty ; scoliosis   PAIN:  Are you having pain? Yes: NPRS scale: 1/ 10  Pain location: at incision site Pain description: throbbing; burning; sore Aggravating factors: continuous movement; reaching in cabinets; walking in grocery store  Relieving factors: laying down  PRECAUTIONS: Back lumbar fusion 3/7/225. Lifting < 10 lbs  RED FLAGS: None   WEIGHT BEARING RESTRICTIONS: No  FALLS:  Has patient fallen in  last 6 months? No  LIVING ENVIRONMENT: Lives with: lives alone Lives in: House/apartment Stairs: Yes: Internal: 1  steps; none Has following equipment at home: Single point cane  OCCUPATION: Retired  PLOF: Independent, Independent with basic ADLs, Independent with household mobility without device, and Independent with gait  PATIENT GOALS: Return to activities she does at her church; Return to [employer]; shop without issues;   NEXT MD VISIT: April 06, 2024  OBJECTIVE:  Note: Objective measures were completed at Evaluation unless otherwise noted.  DIAGNOSTIC FINDINGS:  12/29/2023 Lumbar CT without contrast  IMPRESSION: 1. Transitional lumbosacral anatomy. Designating normal lumbar segmentation results in hypoplastic ribs at L1. Correlation with radiographs is recommended prior to any operative intervention. 2. Levoconvex lumbar scoliosis with degenerative spondylolisthesis at L2-L3, L5-S1. Advanced endplate degeneration at the former, facet arthropathy at the latter. 3. No convincing spinal stenosis by CT. But moderate right lateral recess and severe right neural foraminal stenosis at L2-L3. Mild left lateral recess stenosis at L3-L4. 4.  Aortic Atherosclerosis (ICD10-I70.0).   PATIENT SURVEYS:  Eval:  Modified Oswestry 17/15 34%   COGNITION: Overall cognitive status: Within functional limits for tasks assessed     SENSATION: WFL   POSTURE: rounded shoulders  SKIN: Incision healing well; no warmth noted  LUMBAR ROM: NT due to post surgery   LOWER EXTREMITY ROM:   WFL   LOWER EXTREMITY MMT:  Grossly 4-/5   FUNCTIONAL TESTS:  Eval: 5 times sit to stand: 24.35 sec w. UE support Timed up and go (TUG): 27.10 sec with str cane. Pt experienced some dizziness after completing turn  03/21/2024: Modified CTSIB:  Condition 1- 30 sec (but self corrected loss of balance at 20 sec), Condition 2- 5 sec, Condition 3- 30 sec (with increased sway), Condition 4-  2 sec.  Total- 67/120    03/23/2024 6 MWT with str cane:631ft- increased anterior Lt hip pain at 2 minute mark. Patient frequently touches walls when ambulating. She verbalized dizziness when people were walking around her.    GAIT: Distance walked: 70ft Assistive device utilized: Single point cane Level of assistance: Complete Independence Comments: decreased step length; patient experiences mild dizziness when turning  VESTIBULAR ASSESSMENT: 03/21/2024: Smooth pursuit:  nystagmus noted when looking to the right and up.  No nystagmus looking left Saccades:  WNL Gaze stabilization is WNL Head Impulse Test:  positive bilaterally, worse on right than left [place] negative bilaterally   TREATMENT DATE:  04/10/2024: Nustep level 5 x6  min with PT present to discuss status Standing on blue theraband pad single leg balance 2 x 20sec bilateral in // bars (finger tip hand hold) Tandem walking on AirEx bean in // bar trying to not use UE support  down and back x3 laps Side stepping on AirEx bean in // bar trying to not use UE support  down and back x3 laps Paloff press with red TB x 15 each direction Seated transversus abdominus contraction with pressing ball into thighs 2x10 Standing on airex 5# kettlebell around the worlds x 10 each direction Standing on airex + march holding 5# KB unilateral x 20 Seated on green physioball (PT immediately seated behind patient and patient with hand support of PT mat beside patient):  marching 2x10, LAQ x10 bilat, shoulder flexion with 1# dumbbells 2x10.' 6 inch step taps x 20 no UE support   04/05/2024: Nustep level 5 x6  min with PT present to discuss status Tandem walking on AirEx bean beside counter with UE support down and back x3 laps Side stepping on AirEx bean beside counter with UE support down and back x3 laps Standing on foam pad with eyes closed in parallel bars with CGA/min A from PT 2x30 sec Standing ball toss x20 tosses (in parallel  bars for support, if needed) Standing on foam pad in parallel bars:  rows and shoulder extension with yellow tband.  2x10 each Standing on airex 5# kettlebell around the worlds x 10 each direction Seated transversus abdominus contraction with pressing ball into thighs 2x10 Seated on green physioball (PT immediately seated behind patient and patient with hand support of PT mat beside patient):  marching 2x10, LAQ x10 bilat, shoulder flexion with 1# dumbbells 2x10.   04/03/2024: Nustep level 4 x7  min with PT present to discuss status Standing marching and hip abduction on airex 2.5# AW 2 x 10 bilateral  Seated transversus abdominus contraction with pressing ball into thighs 2x10 Standing paloff press with red long loop x10 bilat Iso shoulder extension with red TB + march 2 x 10 Standing on airex 5# KB around the worlds x 10 each direction Sit to stand with UE crossed x10 Step ups at stairs unilateral 5# KB hold x 20 bilateral  Manual: STM Lt gluteals for pain relief and improved circulation     PATIENT EDUCATION:  Education details: Spinal precautions; log roll; POC Person educated: Patient Education method: Explanation, Demonstration, and Handouts Education comprehension: verbalized understanding, returned demonstration, and needs further education  HOME EXERCISE PROGRAM: Access Code: [email or web address] URL: [email or web address] Date: 03/21/2024 Prepared by: Chaneta Comer Menke  Exercises - Supine Transversus Abdominis Bracing - Hands on Stomach  - 1 x daily - 7 x weekly - 2 sets - 10 reps - 4 sec hold - Supine Diaphragmatic Breathing  - 1 x daily - 7 x weekly - 1 sets - 10 reps - Seated Long Arc Quad  - 1 x daily - 7 x weekly - 2 sets - 10 reps - 2 hold - Standing Heel Raise with Support  - 1 x daily - 7 x weekly - 2 sets - 10 reps - Seated Horizontal Smooth Pursuit  - 1 x daily - 7 x weekly - 2 sets - 10 reps - Seated Vertical Smooth Pursuit  - 1 x daily - 7 x weekly - 2 sets -  10 reps - Seated Proximal-Distal Smooth Pursuit  - 1 x daily - 7 x weekly - 2 sets - 10 reps - Seated Gaze Stabilization with Head Rotation  - 1 x daily - 7 x weekly - 2 sets - 30 sec hold - Seated Gaze Stabilization with Head Nod  - 1 x daily - 7 x weekly - 2 sets - 30 sec hold  ASSESSMENT:  CLINICAL IMPRESSION: Ms Redinger presents to skilled PT reporting no increased pain or discomfort. She feels  her balance has been okay recently. Incorporated more single leg balance today and it was a good challenge for patient. She frequently required UE support to maintain balance when standing on an unstable surface. Overall, she tolerated treatment session well. Patient should continue to progress well with physical therapy. Patient will benefit from skilled PT to address the below impairments and improve overall function.      OBJECTIVE IMPAIRMENTS: Abnormal gait, decreased balance, decreased coordination, difficulty walking, decreased ROM, decreased strength, dizziness, increased muscle spasms, postural dysfunction, and pain.   ACTIVITY LIMITATIONS: carrying, lifting, bending, sitting, standing, squatting, transfers, and bed mobility  PARTICIPATION LIMITATIONS: cleaning, laundry, shopping, community activity, yard work, and church  PERSONAL FACTORS: Age, Fitness, and 1-2 comorbidities: Depression; osteoporosis  are also affecting patient's functional outcome.   REHAB POTENTIAL: Good  CLINICAL DECISION MAKING: Evolving/moderate complexity  EVALUATION COMPLEXITY: Moderate   GOALS: Goals reviewed with patient? Yes  SHORT TERM GOALS: Target date: 04/17/2024  Patient will be independent with initial HEP. Baseline:  Goal status: Met on 03/27/24  2.  Patient will report > or = to 40% improvement in functional activity since starting PT. Baseline:  Goal status: Met on 03/27/24  3.  Patient to undergo vestibular assessment by vestibular physical therapist. Baseline:  Goal status: Met on  03/21/2024  4.  Patient will be able to perform STS with no UE support due to improved LE strength. Baseline:  Goal status: Met on 03/27/24   LONG TERM GOALS: Target date: 05/15/2024 Patient will demonstrate independence in advanced HEP. Baseline:  Goal status: Ongoing  2.  Patient will demonstrate improved walking tolerance and be able to grocery shop with minimal limitations due to her back pain. Baseline:  Goal status: Ongoing  3.  Patient will verbalize and demonstrate self-care strategies to manage pain including tissue mobility practices and change of position. Baseline:  Goal status: INITIAL  3.  Patient will reports > or = 70% improvement in her dizziness since starting PT. Baseline:  Goal status: INITIAL  4.  Patient will be able to return to singing in her church choir and [employer].  Baseline:  Goal status: INITIAL  5.  Patient will perform tug in < or = to 17 sec for decreased falls risk.  Baseline: 27.10sec Goal status: INITIAL    PLAN:  PT FREQUENCY: 2x/week  PT DURATION: 8 weeks  PLANNED INTERVENTIONS: 97164- PT Re-evaluation, 97110-Therapeutic exercises, 97530- Therapeutic activity, 97112- Neuromuscular re-education, 97535- Self Care, 16109- Manual therapy, [phone]- Gait training, [phone]- Orthotic Fit/training, [id number]- Canalith repositioning, [id number]- Aquatic Therapy, [phone]- Electrical stimulation (unattended), 97016- Vasopneumatic device, [id number]- Ultrasound, [id number]- Ionotophoresis 4mg /ml Dexamethasone , Patient/Family education, Balance training, Stair training, Taping, Dry Needling, Joint mobilization, Joint manipulation, Vestibular training, Cryotherapy, and Moist heat.  PLAN FOR NEXT SESSION:  continue functional & core strengthening and balance    Penelope Bowie, PT 04/10/24 11:33 AM [facility] [address] [place], [place] [address] Phone # [phone] Fax [phone]

## 2024-04-12 ENCOUNTER — Ambulatory Visit: Admitting: Rehabilitative and Restorative Service Providers"

## 2024-04-12 ENCOUNTER — Encounter: Payer: Self-pay | Admitting: Rehabilitative and Restorative Service Providers"

## 2024-04-12 DIAGNOSIS — M5416 Radiculopathy, lumbar region: Secondary | ICD-10-CM

## 2024-04-12 DIAGNOSIS — M6281 Muscle weakness (generalized): Secondary | ICD-10-CM

## 2024-04-12 DIAGNOSIS — R2689 Other abnormalities of gait and mobility: Secondary | ICD-10-CM

## 2024-04-12 DIAGNOSIS — R42 Dizziness and giddiness: Secondary | ICD-10-CM

## 2024-04-12 DIAGNOSIS — R2681 Unsteadiness on feet: Secondary | ICD-10-CM

## 2024-04-12 NOTE — Therapy (Signed)
 OUTPATIENT PHYSICAL THERAPY TREATMENT NOTE   Patient Name: Lori Frost MRN: [id number] DOB:September 20, 1946, 78 y.o., female Today's Date: 04/12/2024  END OF SESSION:  PT End of Session - 04/12/24 0930     Visit Number 9    Date for PT Re-Evaluation 05/15/24    Authorization Type HTA    Progress Note Due on Visit 10    PT Start Time 0929    PT Stop Time 1010    PT Time Calculation (min) 41 min    Activity Tolerance Patient tolerated treatment well    Behavior During Therapy WFL for tasks assessed/performed               Past Medical History:  Diagnosis Date   Allergy    Non-Allergic Rhinitis   Balance problem due to vestibular dysfunction    Bloating    CAD (coronary artery disease)    Carotid artery occlusion    Cataract    Chordae tendineae rupture (HCC)    Cyst of ovary, right    Depression    Diverticulosis    DJD (degenerative joint disease)    Eczema    Family history of adverse reaction to anesthesia    SISTER "HEART STOPPED DURING OHYWVPX"1062 UNKNOWN CAUSE -  SHE DIED 10 YRS LATER OF HEART ATTACK   GERD (gastroesophageal reflux disease)    ESOPHAGITIS PRESENCE NOT SPECIFIED   Granuloma annulare    History of hiatal hernia    History of recurrent UTI (urinary tract infection)    Hypercholesterolemia    Hyperlipidemia    Lower back pain    Meniere's disease    Migraine    Mitral valve regurgitation    MODERATE NORMAL, MILD AORTIC REGURGITATION, MILD TRICUSPID REGURGITATION, LVH, GRADE 1 DIASTOLIC DYSFUNCTION, ECHO 2018 SUGGESTS RUPTURED CHORDAE TENDINEAE ( DR. Anastasia Balo)   MVP (mitral valve prolapse)    Myalgia    OA (osteoarthritis) of hip 02/12/2016   Osteoporosis    Other nonrheumatic mitral valve disorders    Psoriasis    Pupil dilated    DUE TO EYE DROPS FOR TX OF RT  EYE PROBLEM   Reactive hypoglycemia    Sensitive skin    Shingles    Vertigo    Vestibular neuronitis    Vitamin D deficiency    Past Surgical History:  Procedure  Laterality Date   EYE SURGERY Right 02-05-15   Cataract   EYE SURGERY Left 02-21-15   Cataract   TONSILLECTOMY     TOTAL HIP ARTHROPLASTY Right 02/12/2016   Procedure: RIGHT TOTAL HIP ARTHROPLASTY ANTERIOR APPROACH;  Surgeon: Liliane Rei, MD;  Location: [facility];  Service: Orthopedics;  Laterality: Right;   Patient Active Problem List   Diagnosis Date Noted   S/P lumbar fusion 02/18/2024   Mitral valve regurgitation 08/14/2021   Coronary artery calcification 08/14/2021   Aortic atherosclerosis (HCC) 08/14/2021   Hyperlipidemia 08/14/2021   Carotid artery disease (HCC) 08/14/2021   OA (osteoarthritis) of hip 02/12/2016    PCP: Elner Hahn, MD  REFERRING PROVIDER: Joaquin Mulberry, MD  REFERRING DIAG: M54.16 (ICD-10-CM) - Radiculopathy, lumbar region  Rationale for Evaluation and Treatment: Rehabilitation  THERAPY DIAG:  Radiculopathy, lumbar region  Muscle weakness (generalized)  Unsteadiness on feet  Dizziness and giddiness  Other abnormalities of gait and mobility  ONSET DATE: Surgery Date 02/18/2024  SUBJECTIVE:  SUBJECTIVE STATEMENT: Patient states not new complaints, states that her pain is only very minimal with "a little discomfort in the back."  Patient states that she drove to [facility] yesterday with some fatigue noted in her back.  PERTINENT HISTORY:  Depression; osteoporosis; vertigo; Meniere's disease; hx Rt total hip arthroplasty ; scoliosis   PAIN:  Are you having pain? Yes: NPRS scale: 0-1/ 10  Pain location: at incision site Pain description: throbbing; burning; sore Aggravating factors: continuous movement; reaching in cabinets; walking in grocery store  Relieving factors: laying down  PRECAUTIONS: Back lumbar fusion 3/7/225. Lifting < 10 lbs  RED  FLAGS: None   WEIGHT BEARING RESTRICTIONS: No  FALLS:  Has patient fallen in last 6 months? No  LIVING ENVIRONMENT: Lives with: lives alone Lives in: House/apartment Stairs: Yes: Internal: 1  steps; none Has following equipment at home: Single point cane  OCCUPATION: Retired  PLOF: Independent, Independent with basic ADLs, Independent with household mobility without device, and Independent with gait  PATIENT GOALS: Return to activities she does at her church; Return to [employer]; shop without issues;   NEXT MD VISIT: July 2025 with Dr Rochelle Chu  OBJECTIVE:  Note: Objective measures were completed at Evaluation unless otherwise noted.  DIAGNOSTIC FINDINGS:  12/29/2023 Lumbar CT without contrast  IMPRESSION: 1. Transitional lumbosacral anatomy. Designating normal lumbar segmentation results in hypoplastic ribs at L1. Correlation with radiographs is recommended prior to any operative intervention. 2. Levoconvex lumbar scoliosis with degenerative spondylolisthesis at L2-L3, L5-S1. Advanced endplate degeneration at the former, facet arthropathy at the latter. 3. No convincing spinal stenosis by CT. But moderate right lateral recess and severe right neural foraminal stenosis at L2-L3. Mild left lateral recess stenosis at L3-L4. 4.  Aortic Atherosclerosis (ICD10-I70.0).   PATIENT SURVEYS:  Eval:  Modified Oswestry 17/15 34%   COGNITION: Overall cognitive status: Within functional limits for tasks assessed     SENSATION: WFL   POSTURE: rounded shoulders  SKIN: Incision healing well; no warmth noted  LUMBAR ROM: NT due to post surgery   LOWER EXTREMITY ROM:   WFL   LOWER EXTREMITY MMT:  Grossly 4-/5   FUNCTIONAL TESTS:  Eval: 5 times sit to stand: 24.35 sec w. UE support Timed up and go (TUG): 27.10 sec with str cane. Pt experienced some dizziness after completing turn  03/21/2024: Modified CTSIB:  Condition 1- 30 sec (but self corrected loss of  balance at 20 sec), Condition 2- 5 sec, Condition 3- 30 sec (with increased sway), Condition 4- 2 sec.  Total- 67/120    03/23/2024 6 MWT with str cane:624ft- increased anterior Lt hip pain at 2 minute mark. Patient frequently touches walls when ambulating. She verbalized dizziness when people were walking around her.    04/12/2024: 5 times sit to stand: 11.77 sec with pain of 1/10 "it's more of a throb, not sharp pain." Timed up and go (TUG): 12.69 sec without assistive device (with slight balance "check" with 180 degree turn)  GAIT: Distance walked: 63ft Assistive device utilized: Single point cane Level of assistance: Complete Independence Comments: decreased step length; patient experiences mild dizziness when turning  VESTIBULAR ASSESSMENT: 03/21/2024: Smooth pursuit:  nystagmus noted when looking to the right and up.  No nystagmus looking left Saccades:  WNL Gaze stabilization is WNL Head Impulse Test:  positive bilaterally, worse on right than left [place] negative bilaterally   TREATMENT DATE:  04/12/2024: Nustep level 5 x6  min with PT present to discuss status 5  times sit to stand and TUG Tandem walking on AirEx beam in parallel bars with UE support down and back x3 laps Side stepping on AirEx beam in parallel bars with UE support down and back x3 laps Standing on airex 5# kettlebell around the worlds x 10 each direction Standing on airex + march holding 5# kettlebell (Farmer's carry) unilateral x 20 Standing on foam pad with eyes closed in parallel bars with CGA/min A from PT 3x20 sec Standing ball toss x20 tosses (in parallel bars for support, if needed) Seated on green physioball (PT immediately seated behind patient and patient with hand support of PT mat beside patient):  marching 2x10, LAQ x10 bilat, shoulder flexion with 1# dumbbells 2x10   04/10/2024: Nustep level 5 x6  min with PT present to discuss status Standing on blue theraband pad single leg balance 2 x  20sec bilateral in // bars (finger tip hand hold) Tandem walking on AirEx bean in // bar trying to not use UE support  down and back x3 laps Side stepping on AirEx bean in // bar trying to not use UE support  down and back x3 laps Paloff press with red TB x 15 each direction Seated transversus abdominus contraction with pressing ball into thighs 2x10 Standing on airex 5# kettlebell around the worlds x 10 each direction Standing on airex + march holding 5# KB unilateral x 20 Seated on green physioball (PT immediately seated behind patient and patient with hand support of PT mat beside patient):  marching 2x10, LAQ x10 bilat, shoulder flexion with 1# dumbbells 2x10.' 6 inch step taps x 20 no UE support   04/05/2024: Nustep level 5 x6  min with PT present to discuss status Tandem walking on AirEx bean beside counter with UE support down and back x3 laps Side stepping on AirEx bean beside counter with UE support down and back x3 laps Standing on foam pad with eyes closed in parallel bars with CGA/min A from PT 2x30 sec Standing ball toss x20 tosses (in parallel bars for support, if needed) Standing on foam pad in parallel bars:  rows and shoulder extension with yellow tband.  2x10 each Standing on airex 5# kettlebell around the worlds x 10 each direction Seated transversus abdominus contraction with pressing ball into thighs 2x10 Seated on green physioball (PT immediately seated behind patient and patient with hand support of PT mat beside patient):  marching 2x10, LAQ x10 bilat, shoulder flexion with 1# dumbbells 2x10.     PATIENT EDUCATION:  Education details: Spinal precautions; log roll; POC Person educated: Patient Education method: Explanation, Demonstration, and Handouts Education comprehension: verbalized understanding, returned demonstration, and needs further education  HOME EXERCISE PROGRAM: Access Code: [email or web address] URL: [email or web address] Date:  03/21/2024 Prepared by: Chaneta Comer Nohelani Benning  Exercises - Supine Transversus Abdominis Bracing - Hands on Stomach  - 1 x daily - 7 x weekly - 2 sets - 10 reps - 4 sec hold - Supine Diaphragmatic Breathing  - 1 x daily - 7 x weekly - 1 sets - 10 reps - Seated Long Arc Quad  - 1 x daily - 7 x weekly - 2 sets - 10 reps - 2 hold - Standing Heel Raise with Support  - 1 x daily - 7 x weekly - 2 sets - 10 reps - Seated Horizontal Smooth Pursuit  - 1 x daily - 7 x weekly - 2 sets - 10 reps - Seated Vertical Smooth Pursuit  - 1 x daily - 7 x  weekly - 2 sets - 10 reps - Seated Proximal-Distal Smooth Pursuit  - 1 x daily - 7 x weekly - 2 sets - 10 reps - Seated Gaze Stabilization with Head Rotation  - 1 x daily - 7 x weekly - 2 sets - 30 sec hold - Seated Gaze Stabilization with Head Nod  - 1 x daily - 7 x weekly - 2 sets - 30 sec hold  ASSESSMENT:  CLINICAL IMPRESSION: Ms Fitt presents to skilled PT reporting that she has experienced a 75% improvement in her dizziness since her surgery.  Patient reports "I am almost back to my baseline for dizziness!"  Patient states that she has returned to singing in the choir singing for 2 Sundays, but has not return to the band yet.  Patient is making great progress towards goals and overall decreased pain.  She continues to improve with her dynamic balance and required less hands on assist with general balance exercises.     OBJECTIVE IMPAIRMENTS: Abnormal gait, decreased balance, decreased coordination, difficulty walking, decreased ROM, decreased strength, dizziness, increased muscle spasms, postural dysfunction, and pain.   ACTIVITY LIMITATIONS: carrying, lifting, bending, sitting, standing, squatting, transfers, and bed mobility  PARTICIPATION LIMITATIONS: cleaning, laundry, shopping, community activity, yard work, and church  PERSONAL FACTORS: Age, Fitness, and 1-2 comorbidities: Depression; osteoporosis  are also affecting patient's functional outcome.    REHAB POTENTIAL: Good  CLINICAL DECISION MAKING: Evolving/moderate complexity  EVALUATION COMPLEXITY: Moderate   GOALS: Goals reviewed with patient? Yes  SHORT TERM GOALS: Target date: 04/17/2024  Patient will be independent with initial HEP. Baseline:  Goal status: Met on 03/27/24  2.  Patient will report > or = to 40% improvement in functional activity since starting PT. Baseline:  Goal status: Met on 03/27/24  3.  Patient to undergo vestibular assessment by vestibular physical therapist. Baseline:  Goal status: Met on 03/21/2024  4.  Patient will be able to perform STS with no UE support due to improved LE strength. Baseline:  Goal status: Met on 03/27/24   LONG TERM GOALS: Target date: 05/15/2024 Patient will demonstrate independence in advanced HEP. Baseline:  Goal status: Ongoing  2.  Patient will demonstrate improved walking tolerance and be able to grocery shop with minimal limitations due to her back pain. Baseline:  Goal status: Ongoing  3.  Patient will verbalize and demonstrate self-care strategies to manage pain including tissue mobility practices and change of position. Baseline:  Goal status: INITIAL  3.  Patient will reports > or = 70% improvement in her dizziness since starting PT. Baseline:  Goal status: Met on 04/12/24 (with patient reporting has improved 75% to her baseline)  4.  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  Baseline:  Goal status: Partially Met (met for return to choir, but not for band on 04/12/24)  5.  Patient will perform tug in < or = to 17 sec for decreased falls risk.  Baseline: 27.10sec Goal status: Partially Met (met for time, but one slight bobble with 180 degree turn on 04/12/24)    PLAN:  PT FREQUENCY: 2x/week  PT DURATION: 8 weeks  PLANNED INTERVENTIONS: 97164- PT Re-evaluation, 97110-Therapeutic exercises, 97530- Therapeutic activity, 97112-  Neuromuscular re-education, 97535- Self Care, [address]- Manual therapy, [phone]- Gait training, [phone]- Orthotic Fit/training, [phone]- Canalith repositioning, [id number]- Aquatic Therapy, [phone]- Electrical stimulation (unattended), 97016- Vasopneumatic device, [id number]- Ultrasound, [id number]- Ionotophoresis 4mg /ml Dexamethasone , Patient/Family education, Balance training,  Stair training, Taping, Dry Needling, Joint mobilization, Joint manipulation, Vestibular training, Cryotherapy, and Moist heat.  PLAN FOR NEXT SESSION:  continue functional & core strengthening and balance    Robyne Christen, PT, DPT 04/12/24, 10:16 AM  [facility] [address], [address] [place], [place] [address] Phone # [phone] Fax [phone]

## 2024-04-17 ENCOUNTER — Encounter: Admitting: Rehabilitative and Restorative Service Providers"

## 2024-04-19 ENCOUNTER — Encounter: Payer: Self-pay | Admitting: Rehabilitative and Restorative Service Providers"

## 2024-04-19 ENCOUNTER — Ambulatory Visit: Attending: Neurological Surgery | Admitting: Rehabilitative and Restorative Service Providers"

## 2024-04-19 DIAGNOSIS — R2689 Other abnormalities of gait and mobility: Secondary | ICD-10-CM | POA: Insufficient documentation

## 2024-04-19 DIAGNOSIS — M5416 Radiculopathy, lumbar region: Secondary | ICD-10-CM | POA: Diagnosis not present

## 2024-04-19 DIAGNOSIS — M6281 Muscle weakness (generalized): Secondary | ICD-10-CM | POA: Diagnosis not present

## 2024-04-19 DIAGNOSIS — R2681 Unsteadiness on feet: Secondary | ICD-10-CM | POA: Insufficient documentation

## 2024-04-19 DIAGNOSIS — Z961 Presence of intraocular lens: Secondary | ICD-10-CM | POA: Diagnosis not present

## 2024-04-19 DIAGNOSIS — H04123 Dry eye syndrome of bilateral lacrimal glands: Secondary | ICD-10-CM | POA: Diagnosis not present

## 2024-04-19 DIAGNOSIS — R42 Dizziness and giddiness: Secondary | ICD-10-CM | POA: Diagnosis not present

## 2024-04-19 NOTE — Therapy (Signed)
 OUTPATIENT PHYSICAL THERAPY TREATMENT NOTE   Patient Name: Lori Frost MRN: [id number] DOB:18-Sep-1946, 78 y.o., female Today's Date: 04/19/2024  Progress Note Reporting Period 03/20/2024 to 04/19/2024  See note below for Objective Data and Assessment of Progress/Goals.       END OF SESSION:  PT End of Session - 04/19/24 0929     Visit Number 10    Date for PT Re-Evaluation 05/15/24    Authorization Type HTA    Progress Note Due on Visit 20    PT Start Time 0927    PT Stop Time 1008    PT Time Calculation (min) 41 min    Activity Tolerance Patient tolerated treatment well    Behavior During Therapy WFL for tasks assessed/performed               Past Medical History:  Diagnosis Date   Allergy    Non-Allergic Rhinitis   Balance problem due to vestibular dysfunction    Bloating    CAD (coronary artery disease)    Carotid artery occlusion    Cataract    Chordae tendineae rupture (HCC)    Cyst of ovary, right    Depression    Diverticulosis    DJD (degenerative joint disease)    Eczema    Family history of adverse reaction to anesthesia    SISTER "HEART STOPPED DURING [id number]"0102 UNKNOWN CAUSE -  SHE DIED 10 YRS LATER OF HEART ATTACK   GERD (gastroesophageal reflux disease)    ESOPHAGITIS PRESENCE NOT SPECIFIED   Granuloma annulare    History of hiatal hernia    History of recurrent UTI (urinary tract infection)    Hypercholesterolemia    Hyperlipidemia    Lower back pain    Meniere's disease    Migraine    Mitral valve regurgitation    MODERATE NORMAL, MILD AORTIC REGURGITATION, MILD TRICUSPID REGURGITATION, LVH, GRADE 1 DIASTOLIC DYSFUNCTION, ECHO 2018 SUGGESTS RUPTURED CHORDAE TENDINEAE ( DR. Anastasia Balo)   MVP (mitral valve prolapse)    Myalgia    OA (osteoarthritis) of hip 02/12/2016   Osteoporosis    Other nonrheumatic mitral valve disorders    Psoriasis    Pupil dilated    DUE TO EYE DROPS FOR TX OF RT  EYE PROBLEM   Reactive hypoglycemia     Sensitive skin    Shingles    Vertigo    Vestibular neuronitis    Vitamin D deficiency    Past Surgical History:  Procedure Laterality Date   EYE SURGERY Right 02-05-15   Cataract   EYE SURGERY Left 02-21-15   Cataract   TONSILLECTOMY     TOTAL HIP ARTHROPLASTY Right 02/12/2016   Procedure: RIGHT TOTAL HIP ARTHROPLASTY ANTERIOR APPROACH;  Surgeon: Liliane Rei, MD;  Location: [facility];  Service: Orthopedics;  Laterality: Right;   Patient Active Problem List   Diagnosis Date Noted   S/P lumbar fusion 02/18/2024   Mitral valve regurgitation 08/14/2021   Coronary artery calcification 08/14/2021   Aortic atherosclerosis (HCC) 08/14/2021   Hyperlipidemia 08/14/2021   Carotid artery disease (HCC) 08/14/2021   OA (osteoarthritis) of hip 02/12/2016    PCP: Elner Hahn, MD  REFERRING PROVIDER: Joaquin Mulberry, MD  REFERRING DIAG: M54.16 (ICD-10-CM) - Radiculopathy, lumbar region  Rationale for Evaluation and Treatment: Rehabilitation  THERAPY DIAG:  Radiculopathy, lumbar region  Muscle weakness (generalized)  Unsteadiness on feet  Dizziness and giddiness  Other abnormalities of gait and mobility  ONSET DATE: Surgery Date 02/18/2024  SUBJECTIVE:  SUBJECTIVE STATEMENT: Patient states she was able to return to band.  Additionally, she states that she and a friend washed their cars yesterday.  States that she had a little soreness last night, but denies current pain.  PERTINENT HISTORY:  Depression; osteoporosis; vertigo; Meniere's disease; hx Rt total hip arthroplasty ; scoliosis   PAIN:  Are you having pain? Yes: NPRS scale: 0/ 10  Pain location: at incision site Pain description: throbbing; burning; sore Aggravating factors: continuous movement; reaching in cabinets; walking in grocery  store  Relieving factors: laying down  PRECAUTIONS: Back lumbar fusion 3/7/225. Lifting < 10 lbs  RED FLAGS: None   WEIGHT BEARING RESTRICTIONS: No  FALLS:  Has patient fallen in last 6 months? No  LIVING ENVIRONMENT: Lives with: lives alone Lives in: House/apartment Stairs: Yes: Internal: 1  steps; none Has following equipment at home: Single point cane  OCCUPATION: Retired  PLOF: Independent, Independent with basic ADLs, Independent with household mobility without device, and Independent with gait  PATIENT GOALS: Return to activities she does at her church; Return to [employer]; shop without issues;   NEXT MD VISIT: July 2025 with Dr Rochelle Chu  OBJECTIVE:  Note: Objective measures were completed at Evaluation unless otherwise noted.  DIAGNOSTIC FINDINGS:  12/29/2023 Lumbar CT without contrast  IMPRESSION: 1. Transitional lumbosacral anatomy. Designating normal lumbar segmentation results in hypoplastic ribs at L1. Correlation with radiographs is recommended prior to any operative intervention. 2. Levoconvex lumbar scoliosis with degenerative spondylolisthesis at L2-L3, L5-S1. Advanced endplate degeneration at the former, facet arthropathy at the latter. 3. No convincing spinal stenosis by CT. But moderate right lateral recess and severe right neural foraminal stenosis at L2-L3. Mild left lateral recess stenosis at L3-L4. 4.  Aortic Atherosclerosis (ICD10-I70.0).   PATIENT SURVEYS:  Eval:  Modified Oswestry 17/15 34%   COGNITION: Overall cognitive status: Within functional limits for tasks assessed     SENSATION: WFL   POSTURE: rounded shoulders  SKIN: Incision healing well; no warmth noted  LUMBAR ROM: NT due to post surgery   LOWER EXTREMITY ROM:   WFL   LOWER EXTREMITY MMT:  Grossly 4-/5   FUNCTIONAL TESTS:  Eval: 5 times sit to stand: 24.35 sec w. UE support Timed up and go (TUG): 27.10 sec with str cane. Pt experienced some  dizziness after completing turn  03/21/2024: Modified CTSIB:  Condition 1- 30 sec (but self corrected loss of balance at 20 sec), Condition 2- 5 sec, Condition 3- 30 sec (with increased sway), Condition 4- 2 sec.  Total- 67/120    03/23/2024 6 MWT with str cane:652ft- increased anterior Lt hip pain at 2 minute mark. Patient frequently touches walls when ambulating. She verbalized dizziness when people were walking around her.    04/12/2024: 5 times sit to stand: 11.77 sec with pain of 1/10 "it's more of a throb, not sharp pain." Timed up and go (TUG): 12.69 sec without assistive device (with slight balance "check" with 180 degree turn)  04/19/2024: 5 times sit to stand: 10.99 sec Timed up and go (TUG): 10.14 sec with some slight self corrected "balance checks" Modified CTSIB:  Condition 1- 30 sec (very slight balance adjustments), Condition 2- 17 sec, Condition 3- 30 sec (with increased sway), Condition 4- 5 sec.  Total- 82/120  GAIT: Distance walked: 51ft Assistive device utilized: Single point cane Level of assistance: Complete Independence Comments: decreased step length; patient experiences mild dizziness when turning  VESTIBULAR ASSESSMENT: 03/21/2024: Smooth pursuit:  nystagmus noted when  looking to the right and up.  No nystagmus looking left Saccades:  WNL Gaze stabilization is WNL Head Impulse Test:  positive bilaterally, worse on right than left [place] negative bilaterally   TREATMENT DATE:  04/19/2024: Nustep level 5 x6  min with PT present to discuss status 5 times sit to stand and TUG Modified CTSIB 82/120 Tandem walking on AirEx beam in parallel bars with UE support down and back x3 laps Side stepping on AirEx beam in parallel bars with UE support down and back x3 laps Standing on foam pad: shoulder rows with red tband 2x10 Standing on foam pad: shoulder extension with red tband 2x10 Standing on airex 5# kettlebell around the worlds x 10 each direction Standing on  airex + march holding 5# kettlebell (Farmer's carry) unilateral x 20 Seated on green physioball (PT immediately seated behind patient and patient with hand support of PT mat beside patient):  marching 2x10, LAQ 2x10 bilat, shoulder flexion with 1# dumbbells 2x10 Standing hip flexor stretch with one foot on 2nd step x20 sec bilat   04/12/2024: Nustep level 5 x6  min with PT present to discuss status 5 times sit to stand and TUG Tandem walking on AirEx beam in parallel bars with UE support down and back x3 laps Side stepping on AirEx beam in parallel bars with UE support down and back x3 laps Standing on airex 5# kettlebell around the worlds x 10 each direction Standing on airex + march holding 5# kettlebell (Farmer's carry) unilateral x 20 Standing on foam pad with eyes closed in parallel bars with CGA/min A from PT 3x20 sec Standing ball toss x20 tosses (in parallel bars for support, if needed) Seated on green physioball (PT immediately seated behind patient and patient with hand support of PT mat beside patient):  marching 2x10, LAQ x10 bilat, shoulder flexion with 1# dumbbells 2x10   04/10/2024: Nustep level 5 x6  min with PT present to discuss status Standing on blue theraband pad single leg balance 2 x 20sec bilateral in // bars (finger tip hand hold) Tandem walking on AirEx bean in // bar trying to not use UE support  down and back x3 laps Side stepping on AirEx bean in // bar trying to not use UE support  down and back x3 laps Paloff press with red TB x 15 each direction Seated transversus abdominus contraction with pressing ball into thighs 2x10 Standing on airex 5# kettlebell around the worlds x 10 each direction Standing on airex + march holding 5# KB unilateral x 20 Seated on green physioball (PT immediately seated behind patient and patient with hand support of PT mat beside patient):  marching 2x10, LAQ x10 bilat, shoulder flexion with 1# dumbbells 2x10.' 6 inch step taps x 20 no  UE support   PATIENT EDUCATION:  Education details: Spinal precautions; log roll; POC Person educated: Patient Education method: Explanation, Demonstration, and Handouts Education comprehension: verbalized understanding, returned demonstration, and needs further education  HOME EXERCISE PROGRAM: Access Code: [email or web address] URL: [email or web address] Date: 03/21/2024 Prepared by: Chaneta Comer Oakes Mccready  Exercises - Supine Transversus Abdominis Bracing - Hands on Stomach  - 1 x daily - 7 x weekly - 2 sets - 10 reps - 4 sec hold - Supine Diaphragmatic Breathing  - 1 x daily - 7 x weekly - 1 sets - 10 reps - Seated Long Arc Quad  - 1 x daily - 7 x weekly - 2 sets - 10 reps - 2 hold - Standing Heel  Raise with Support  - 1 x daily - 7 x weekly - 2 sets - 10 reps - Seated Horizontal Smooth Pursuit  - 1 x daily - 7 x weekly - 2 sets - 10 reps - Seated Vertical Smooth Pursuit  - 1 x daily - 7 x weekly - 2 sets - 10 reps - Seated Proximal-Distal Smooth Pursuit  - 1 x daily - 7 x weekly - 2 sets - 10 reps - Seated Gaze Stabilization with Head Rotation  - 1 x daily - 7 x weekly - 2 sets - 30 sec hold - Seated Gaze Stabilization with Head Nod  - 1 x daily - 7 x weekly - 2 sets - 30 sec hold  ASSESSMENT:  CLINICAL IMPRESSION: Ms Lemoine presents to skilled PT reporting that she is feeling better today and denies back pain.  Patient with improved scores noted on functional assessments.  Patient is progressing with improved balance with improved score on modified CTSIB.  Patient is progressing well towards goals and improved functional strength.     OBJECTIVE IMPAIRMENTS: Abnormal gait, decreased balance, decreased coordination, difficulty walking, decreased ROM, decreased strength, dizziness, increased muscle spasms, postural dysfunction, and pain.   ACTIVITY LIMITATIONS: carrying, lifting, bending, sitting, standing, squatting, transfers, and bed mobility  PARTICIPATION LIMITATIONS: cleaning,  laundry, shopping, community activity, yard work, and church  PERSONAL FACTORS: Age, Fitness, and 1-2 comorbidities: Depression; osteoporosis  are also affecting patient's functional outcome.   REHAB POTENTIAL: Good  CLINICAL DECISION MAKING: Evolving/moderate complexity  EVALUATION COMPLEXITY: Moderate   GOALS: Goals reviewed with patient? Yes  SHORT TERM GOALS: Target date: 04/17/2024  Patient will be independent with initial HEP. Baseline:  Goal status: Met on 03/27/24  2.  Patient will report > or = to 40% improvement in functional activity since starting PT. Baseline:  Goal status: Met on 03/27/24  3.  Patient to undergo vestibular assessment by vestibular physical therapist. Baseline:  Goal status: Met on 03/21/2024  4.  Patient will be able to perform STS with no UE support due to improved LE strength. Baseline:  Goal status: Met on 03/27/24   LONG TERM GOALS: Target date: 05/15/2024 Patient will demonstrate independence in advanced HEP. Baseline:  Goal status: Ongoing  2.  Patient will demonstrate improved walking tolerance and be able to grocery shop with minimal limitations due to her back pain. Baseline:  Goal status: Ongoing  3.  Patient will verbalize and demonstrate self-care strategies to manage pain including tissue mobility practices and change of position. Baseline:  Goal status: INITIAL  3.  Patient will reports > or = 70% improvement in her dizziness since starting PT. Baseline:  Goal status: Met on 04/12/24 (with patient reporting has improved 75% to her baseline)  4.  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  Baseline:  Goal status: Partially Met (met for return to choir, but not for band on 04/12/24)  5.  Patient will perform tug in < or = to 17 sec for decreased falls risk.  Baseline: 27.10sec Goal status: Partially Met (met for time, but one slight bobble with 180 degree turn on  04/12/24)    PLAN:  PT FREQUENCY: 2x/week  PT DURATION: 8 weeks  PLANNED INTERVENTIONS: 97164- PT Re-evaluation, 97110-Therapeutic exercises, 97530- Therapeutic activity, 97112- Neuromuscular re-education, 97535- Self Care, 16109- Manual therapy, [id number]- Gait training, [phone]- Orthotic Fit/training, [phone]- Canalith repositioning, [id number]- Aquatic Therapy, [phone]- Electrical stimulation (  unattended), 97016- Vasopneumatic device, [id number]- Ultrasound, 16109- Ionotophoresis 4mg /ml Dexamethasone , Patient/Family education, Balance training, Stair training, Taping, Dry Needling, Joint mobilization, Joint manipulation, Vestibular training, Cryotherapy, and Moist heat.  PLAN FOR NEXT SESSION:  continue functional & core strengthening and balance    Robyne Christen, PT, DPT 04/19/24, 10:23 AM  [facility] [address] [place], [place] [address] Phone # [phone] Fax [phone]

## 2024-04-21 ENCOUNTER — Ambulatory Visit: Admitting: Rehabilitative and Restorative Service Providers"

## 2024-04-21 ENCOUNTER — Encounter: Payer: Self-pay | Admitting: Rehabilitative and Restorative Service Providers"

## 2024-04-21 DIAGNOSIS — R2689 Other abnormalities of gait and mobility: Secondary | ICD-10-CM

## 2024-04-21 DIAGNOSIS — R2681 Unsteadiness on feet: Secondary | ICD-10-CM

## 2024-04-21 DIAGNOSIS — M5416 Radiculopathy, lumbar region: Secondary | ICD-10-CM

## 2024-04-21 DIAGNOSIS — M6281 Muscle weakness (generalized): Secondary | ICD-10-CM

## 2024-04-21 DIAGNOSIS — R42 Dizziness and giddiness: Secondary | ICD-10-CM

## 2024-04-21 NOTE — Therapy (Signed)
 OUTPATIENT PHYSICAL THERAPY TREATMENT NOTE   Patient Name: Lori Frost MRN: [id number] DOB:10/08/1946, 78 y.o., female Today's Date: 04/21/2024    END OF SESSION:  PT End of Session - 04/21/24 0936     Visit Number 11    Date for PT Re-Evaluation 05/15/24    Authorization Type HTA    Progress Note Due on Visit 20    PT Start Time 0930    PT Stop Time 1010    PT Time Calculation (min) 40 min    Activity Tolerance Patient tolerated treatment well    Behavior During Therapy WFL for tasks assessed/performed               Past Medical History:  Diagnosis Date   Allergy    Non-Allergic Rhinitis   Balance problem due to vestibular dysfunction    Bloating    CAD (coronary artery disease)    Carotid artery occlusion    Cataract    Chordae tendineae rupture (HCC)    Cyst of ovary, right    Depression    Diverticulosis    DJD (degenerative joint disease)    Eczema    Family history of adverse reaction to anesthesia    SISTER "HEART STOPPED DURING [id number]"1478 UNKNOWN CAUSE -  SHE DIED 10 YRS LATER OF HEART ATTACK   GERD (gastroesophageal reflux disease)    ESOPHAGITIS PRESENCE NOT SPECIFIED   Granuloma annulare    History of hiatal hernia    History of recurrent UTI (urinary tract infection)    Hypercholesterolemia    Hyperlipidemia    Lower back pain    Meniere's disease    Migraine    Mitral valve regurgitation    MODERATE NORMAL, MILD AORTIC REGURGITATION, MILD TRICUSPID REGURGITATION, LVH, GRADE 1 DIASTOLIC DYSFUNCTION, ECHO 2018 SUGGESTS RUPTURED CHORDAE TENDINEAE ( DR. Anastasia Balo)   MVP (mitral valve prolapse)    Myalgia    OA (osteoarthritis) of hip 02/12/2016   Osteoporosis    Other nonrheumatic mitral valve disorders    Psoriasis    Pupil dilated    DUE TO EYE DROPS FOR TX OF RT  EYE PROBLEM   Reactive hypoglycemia    Sensitive skin    Shingles    Vertigo    Vestibular neuronitis    Vitamin D deficiency    Past Surgical History:  Procedure  Laterality Date   EYE SURGERY Right 02-05-15   Cataract   EYE SURGERY Left 02-21-15   Cataract   TONSILLECTOMY     TOTAL HIP ARTHROPLASTY Right 02/12/2016   Procedure: RIGHT TOTAL HIP ARTHROPLASTY ANTERIOR APPROACH;  Surgeon: Liliane Rei, MD;  Location: [facility];  Service: Orthopedics;  Laterality: Right;   Patient Active Problem List   Diagnosis Date Noted   S/P lumbar fusion 02/18/2024   Mitral valve regurgitation 08/14/2021   Coronary artery calcification 08/14/2021   Aortic atherosclerosis (HCC) 08/14/2021   Hyperlipidemia 08/14/2021   Carotid artery disease (HCC) 08/14/2021   OA (osteoarthritis) of hip 02/12/2016    PCP: Elner Hahn, MD  REFERRING PROVIDER: Joaquin Mulberry, MD  REFERRING DIAG: M54.16 (ICD-10-CM) - Radiculopathy, lumbar region  Rationale for Evaluation and Treatment: Rehabilitation  THERAPY DIAG:  Radiculopathy, lumbar region  Muscle weakness (generalized)  Unsteadiness on feet  Dizziness and giddiness  Other abnormalities of gait and mobility  ONSET DATE: Surgery Date 02/18/2024  SUBJECTIVE:  SUBJECTIVE STATEMENT: Patient states she was able to return to band.  Additionally, she states that she and a friend washed their cars yesterday.  States that she had a little soreness last night, but denies current pain.  PERTINENT HISTORY:  Depression; osteoporosis; vertigo; Meniere's disease; hx Rt total hip arthroplasty ; scoliosis   PAIN:  Are you having pain? Yes: NPRS scale: 0/ 10  Pain location: at incision site Pain description: throbbing; burning; sore Aggravating factors: continuous movement; reaching in cabinets; walking in grocery store  Relieving factors: laying down  PRECAUTIONS: Back lumbar fusion 3/7/225. Lifting < 10 lbs  RED FLAGS: None   WEIGHT  BEARING RESTRICTIONS: No  FALLS:  Has patient fallen in last 6 months? No  LIVING ENVIRONMENT: Lives with: lives alone Lives in: House/apartment Stairs: Yes: Internal: 1  steps; none Has following equipment at home: Single point cane  OCCUPATION: Retired  PLOF: Independent, Independent with basic ADLs, Independent with household mobility without device, and Independent with gait  PATIENT GOALS: Return to activities she does at her church; Return to [employer]; shop without issues;   NEXT MD VISIT: July 2025 with Dr Rochelle Chu  OBJECTIVE:  Note: Objective measures were completed at Evaluation unless otherwise noted.  DIAGNOSTIC FINDINGS:  12/29/2023 Lumbar CT without contrast  IMPRESSION: 1. Transitional lumbosacral anatomy. Designating normal lumbar segmentation results in hypoplastic ribs at L1. Correlation with radiographs is recommended prior to any operative intervention. 2. Levoconvex lumbar scoliosis with degenerative spondylolisthesis at L2-L3, L5-S1. Advanced endplate degeneration at the former, facet arthropathy at the latter. 3. No convincing spinal stenosis by CT. But moderate right lateral recess and severe right neural foraminal stenosis at L2-L3. Mild left lateral recess stenosis at L3-L4. 4.  Aortic Atherosclerosis (ICD10-I70.0).   PATIENT SURVEYS:  Eval:  Modified Oswestry 17/15 34%   COGNITION: Overall cognitive status: Within functional limits for tasks assessed     SENSATION: WFL   POSTURE: rounded shoulders  SKIN: Incision healing well; no warmth noted  LUMBAR ROM: NT due to post surgery   LOWER EXTREMITY ROM:   WFL   LOWER EXTREMITY MMT:  Grossly 4-/5   FUNCTIONAL TESTS:  Eval: 5 times sit to stand: 24.35 sec w. UE support Timed up and go (TUG): 27.10 sec with str cane. Pt experienced some dizziness after completing turn  03/21/2024: Modified CTSIB:  Condition 1- 30 sec (but self corrected loss of balance at 20 sec),  Condition 2- 5 sec, Condition 3- 30 sec (with increased sway), Condition 4- 2 sec.  Total- 67/120    03/23/2024 6 MWT with str cane:663ft- increased anterior Lt hip pain at 2 minute mark. Patient frequently touches walls when ambulating. She verbalized dizziness when people were walking around her.    04/12/2024: 5 times sit to stand: 11.77 sec with pain of 1/10 "it's more of a throb, not sharp pain." Timed up and go (TUG): 12.69 sec without assistive device (with slight balance "check" with 180 degree turn)  04/19/2024: 5 times sit to stand: 10.99 sec Timed up and go (TUG): 10.14 sec with some slight self corrected "balance checks" Modified CTSIB:  Condition 1- 30 sec (very slight balance adjustments), Condition 2- 17 sec, Condition 3- 30 sec (with increased sway), Condition 4- 5 sec.  Total- 82/120  GAIT: Distance walked: 16ft Assistive device utilized: Single point cane Level of assistance: Complete Independence Comments: decreased step length; patient experiences mild dizziness when turning  VESTIBULAR ASSESSMENT: 03/21/2024: Smooth pursuit:  nystagmus noted when  looking to the right and up.  No nystagmus looking left Saccades:  WNL Gaze stabilization is WNL Head Impulse Test:  positive bilaterally, worse on right than left [place] negative bilaterally   TREATMENT DATE:  04/21/2024: Nustep level 5 x6  min with PT present to discuss status Tandem walking on AirEx beam in parallel bars with UE support down and back x3 laps Side stepping on AirEx beam in parallel bars with UE support down and back x3 laps Standing on foam pad: shoulder rows with red tband 2x10 Standing on foam pad: shoulder extension with red tband 2x10 Standing on airex 5# kettlebell around the worlds x 10 each direction Standing on airex + march holding 5# kettlebell (Farmer's carry) unilateral x 20 Standing on foam pad with eyes closed in parallel bars with CGA/min A from PT 3x20 sec Standing ball toss x20  tosses (in parallel bars for support, if needed) FWD step over 4 small hurdles in parallel bars down and back x3 laps Side stepping  over 4 small hurdles in parallel bars down and back x3 laps Standing on mini trampoline:  3 way weight shift 2x30 sec each (with UE support as needed) Standing hip flexor stretch with one foot on 2nd step x20 sec bilat   04/19/2024: Nustep level 5 x6  min with PT present to discuss status 5 times sit to stand and TUG Modified CTSIB 82/120 Tandem walking on AirEx beam in parallel bars with UE support down and back x3 laps Side stepping on AirEx beam in parallel bars with UE support down and back x3 laps Standing on foam pad: shoulder rows with red tband 2x10 Standing on foam pad: shoulder extension with red tband 2x10 Standing on airex 5# kettlebell around the worlds x 10 each direction Standing on airex + march holding 5# kettlebell (Farmer's carry) unilateral x 20 Seated on green physioball (PT immediately seated behind patient and patient with hand support of PT mat beside patient):  marching 2x10, LAQ 2x10 bilat, shoulder flexion with 1# dumbbells 2x10 Standing hip flexor stretch with one foot on 2nd step x20 sec bilat   04/12/2024: Nustep level 5 x6  min with PT present to discuss status 5 times sit to stand and TUG Tandem walking on AirEx beam in parallel bars with UE support down and back x3 laps Side stepping on AirEx beam in parallel bars with UE support down and back x3 laps Standing on airex 5# kettlebell around the worlds x 10 each direction Standing on airex + march holding 5# kettlebell (Farmer's carry) unilateral x 20 Standing on foam pad with eyes closed in parallel bars with CGA/min A from PT 3x20 sec Standing ball toss x20 tosses (in parallel bars for support, if needed) Seated on green physioball (PT immediately seated behind patient and patient with hand support of PT mat beside patient):  marching 2x10, LAQ x10 bilat, shoulder flexion with  1# dumbbells 2x10    PATIENT EDUCATION:  Education details: Spinal precautions; log roll; POC Person educated: Patient Education method: Explanation, Demonstration, and Handouts Education comprehension: verbalized understanding, returned demonstration, and needs further education  HOME EXERCISE PROGRAM: Access Code: [email or web address] URL: [email or web address] Date: 03/21/2024 Prepared by: Chaneta Comer Shahid Flori  Exercises - Supine Transversus Abdominis Bracing - Hands on Stomach  - 1 x daily - 7 x weekly - 2 sets - 10 reps - 4 sec hold - Supine Diaphragmatic Breathing  - 1 x daily - 7 x weekly - 1 sets - 10 reps -  Seated Long Arc Quad  - 1 x daily - 7 x weekly - 2 sets - 10 reps - 2 hold - Standing Heel Raise with Support  - 1 x daily - 7 x weekly - 2 sets - 10 reps - Seated Horizontal Smooth Pursuit  - 1 x daily - 7 x weekly - 2 sets - 10 reps - Seated Vertical Smooth Pursuit  - 1 x daily - 7 x weekly - 2 sets - 10 reps - Seated Proximal-Distal Smooth Pursuit  - 1 x daily - 7 x weekly - 2 sets - 10 reps - Seated Gaze Stabilization with Head Rotation  - 1 x daily - 7 x weekly - 2 sets - 30 sec hold - Seated Gaze Stabilization with Head Nod  - 1 x daily - 7 x weekly - 2 sets - 30 sec hold  ASSESSMENT:  CLINICAL IMPRESSION: Ms Marts presents to skilled PT reporting that her dizziness is back to baseline.  Patient able to progress with dynamic balance tasks and weight shifting on non-compliant surfaces today.  Patient able to perform session with very few recovery periods.  Patient continues to state that stretch at end of session for hip flexor feels good.  Patient continues to progress towards goal related activities and improved core strength and decreased pain.  Patient has been able to return to both choir and the band at this time.  Patient is progressing appropriately and preparing for eventual discharge at end of episode.     OBJECTIVE IMPAIRMENTS: Abnormal gait, decreased  balance, decreased coordination, difficulty walking, decreased ROM, decreased strength, dizziness, increased muscle spasms, postural dysfunction, and pain.   ACTIVITY LIMITATIONS: carrying, lifting, bending, sitting, standing, squatting, transfers, and bed mobility  PARTICIPATION LIMITATIONS: cleaning, laundry, shopping, community activity, yard work, and church  PERSONAL FACTORS: Age, Fitness, and 1-2 comorbidities: Depression; osteoporosis are also affecting patient's functional outcome.   REHAB POTENTIAL: Good  CLINICAL DECISION MAKING: Evolving/moderate complexity  EVALUATION COMPLEXITY: Moderate   GOALS: Goals reviewed with patient? Yes  SHORT TERM GOALS: Target date: 04/17/2024  Patient will be independent with initial HEP. Baseline:  Goal status: Met on 03/27/24  2.  Patient will report > or = to 40% improvement in functional activity since starting PT. Baseline:  Goal status: Met on 03/27/24  3.  Patient to undergo vestibular assessment by vestibular physical therapist. Baseline:  Goal status: Met on 03/21/2024  4.  Patient will be able to perform STS with no UE support due to improved LE strength. Baseline:  Goal status: Met on 03/27/24   LONG TERM GOALS: Target date: 05/15/2024 Patient will demonstrate independence in advanced HEP. Baseline:  Goal status: Ongoing  2.  Patient will demonstrate improved walking tolerance and be able to grocery shop with minimal limitations due to her back pain. Baseline:  Goal status: Ongoing  3.  Patient will verbalize and demonstrate self-care strategies to manage pain including tissue mobility practices and change of position. Baseline:  Goal status: INITIAL  3.  Patient will reports > or = 70% improvement in her dizziness since starting PT. Baseline:  Goal status: Met on 04/12/24 (with patient reporting has improved 75% to her baseline)  4.  Patient will be able to return to singing and to marching band in her church choir and  [employer].  Baseline:  Goal status: Met on 04/21/24  5.  Patient will perform tug in < or = to 17 sec for decreased falls risk.  Baseline: 27.10sec Goal status: Partially Met (met for time, but one slight bobble with 180 degree turn on 04/12/24)    PLAN:  PT FREQUENCY: 2x/week  PT DURATION: 8 weeks  PLANNED INTERVENTIONS: 97164- PT Re-evaluation, 97110-Therapeutic exercises, 97530- Therapeutic activity, 97112- Neuromuscular re-education, 97535- Self Care, 16109- Manual therapy, [phone]- Gait training, [phone]- Orthotic Fit/training, [phone]- Canalith repositioning, [id number]- Aquatic Therapy, [phone]- Electrical stimulation (unattended), 97016- Vasopneumatic device, [id number]- Ultrasound, [id number]- Ionotophoresis 4mg /ml Dexamethasone , Patient/Family education, Balance training, Stair training, Taping, Dry Needling, Joint mobilization, Joint manipulation, Vestibular training, Cryotherapy, and Moist heat.  PLAN FOR NEXT SESSION:  continue functional & core strengthening and balance    Robyne Christen, PT, DPT 04/21/24, 11:25 AM  [facility] [address] [place], [place] [address] Phone # [phone] Fax [phone]

## 2024-04-24 ENCOUNTER — Ambulatory Visit: Admitting: Physical Therapy

## 2024-04-24 ENCOUNTER — Encounter: Payer: Self-pay | Admitting: Physical Therapy

## 2024-04-24 DIAGNOSIS — M5416 Radiculopathy, lumbar region: Secondary | ICD-10-CM

## 2024-04-24 DIAGNOSIS — R2681 Unsteadiness on feet: Secondary | ICD-10-CM

## 2024-04-24 DIAGNOSIS — R2689 Other abnormalities of gait and mobility: Secondary | ICD-10-CM

## 2024-04-24 DIAGNOSIS — R42 Dizziness and giddiness: Secondary | ICD-10-CM

## 2024-04-24 DIAGNOSIS — M6281 Muscle weakness (generalized): Secondary | ICD-10-CM

## 2024-04-24 NOTE — Therapy (Signed)
 OUTPATIENT PHYSICAL THERAPY TREATMENT NOTE   Patient Name: Lori Frost MRN: [id number] DOB:10/27/1946, 78 y.o., female Today's Date: 04/24/2024    END OF SESSION:  PT End of Session - 04/24/24 1443     Visit Number 12    Date for PT Re-Evaluation 05/15/24    Authorization Type HTA    Progress Note Due on Visit 20    PT Start Time 1400    PT Stop Time 1442    PT Time Calculation (min) 42 min    Activity Tolerance Patient tolerated treatment well    Behavior During Therapy WFL for tasks assessed/performed                Past Medical History:  Diagnosis Date   Allergy    Non-Allergic Rhinitis   Balance problem due to vestibular dysfunction    Bloating    CAD (coronary artery disease)    Carotid artery occlusion    Cataract    Chordae tendineae rupture (HCC)    Cyst of ovary, right    Depression    Diverticulosis    DJD (degenerative joint disease)    Eczema    Family history of adverse reaction to anesthesia    SISTER "HEART STOPPED DURING QIONGEX"5284 UNKNOWN CAUSE -  SHE DIED 10 YRS LATER OF HEART ATTACK   GERD (gastroesophageal reflux disease)    ESOPHAGITIS PRESENCE NOT SPECIFIED   Granuloma annulare    History of hiatal hernia    History of recurrent UTI (urinary tract infection)    Hypercholesterolemia    Hyperlipidemia    Lower back pain    Meniere's disease    Migraine    Mitral valve regurgitation    MODERATE NORMAL, MILD AORTIC REGURGITATION, MILD TRICUSPID REGURGITATION, LVH, GRADE 1 DIASTOLIC DYSFUNCTION, ECHO 2018 SUGGESTS RUPTURED CHORDAE TENDINEAE ( DR. Anastasia Balo)   MVP (mitral valve prolapse)    Myalgia    OA (osteoarthritis) of hip 02/12/2016   Osteoporosis    Other nonrheumatic mitral valve disorders    Psoriasis    Pupil dilated    DUE TO EYE DROPS FOR TX OF RT  EYE PROBLEM   Reactive hypoglycemia    Sensitive skin    Shingles    Vertigo    Vestibular neuronitis    Vitamin D deficiency    Past Surgical History:  Procedure  Laterality Date   EYE SURGERY Right 02-05-15   Cataract   EYE SURGERY Left 02-21-15   Cataract   TONSILLECTOMY     TOTAL HIP ARTHROPLASTY Right 02/12/2016   Procedure: RIGHT TOTAL HIP ARTHROPLASTY ANTERIOR APPROACH;  Surgeon: Liliane Rei, MD;  Location: [facility];  Service: Orthopedics;  Laterality: Right;   Patient Active Problem List   Diagnosis Date Noted   S/P lumbar fusion 02/18/2024   Mitral valve regurgitation 08/14/2021   Coronary artery calcification 08/14/2021   Aortic atherosclerosis (HCC) 08/14/2021   Hyperlipidemia 08/14/2021   Carotid artery disease (HCC) 08/14/2021   OA (osteoarthritis) of hip 02/12/2016    PCP: Elner Hahn, MD  REFERRING PROVIDER: Joaquin Mulberry, MD  REFERRING DIAG: M54.16 (ICD-10-CM) - Radiculopathy, lumbar region  Rationale for Evaluation and Treatment: Rehabilitation  THERAPY DIAG:  Radiculopathy, lumbar region  Muscle weakness (generalized)  Unsteadiness on feet  Dizziness and giddiness  Other abnormalities of gait and mobility  ONSET DATE: Surgery Date 02/18/2024  SUBJECTIVE:  SUBJECTIVE STATEMENT: Patient reports she is doing okay today. She is having some pain in between her shoulder blades.   PERTINENT HISTORY:  Depression; osteoporosis; vertigo; Meniere's disease; hx Rt total hip arthroplasty ; scoliosis   PAIN:  Are you having pain? Yes: NPRS scale: 0/ 10  Pain location: at incision site Pain description: throbbing; burning; sore Aggravating factors: continuous movement; reaching in cabinets; walking in grocery store  Relieving factors: laying down  PRECAUTIONS: Back lumbar fusion 3/7/225. Lifting < 10 lbs  RED FLAGS: None   WEIGHT BEARING RESTRICTIONS: No  FALLS:  Has patient fallen in last 6 months? No  LIVING  ENVIRONMENT: Lives with: lives alone Lives in: House/apartment Stairs: Yes: Internal: 1  steps; none Has following equipment at home: Single point cane  OCCUPATION: Retired  PLOF: Independent, Independent with basic ADLs, Independent with household mobility without device, and Independent with gait  PATIENT GOALS: Return to activities she does at her church; Return to [employer]; shop without issues;   NEXT MD VISIT: July 2025 with Dr Rochelle Chu  OBJECTIVE:  Note: Objective measures were completed at Evaluation unless otherwise noted.  DIAGNOSTIC FINDINGS:  12/29/2023 Lumbar CT without contrast  IMPRESSION: 1. Transitional lumbosacral anatomy. Designating normal lumbar segmentation results in hypoplastic ribs at L1. Correlation with radiographs is recommended prior to any operative intervention. 2. Levoconvex lumbar scoliosis with degenerative spondylolisthesis at L2-L3, L5-S1. Advanced endplate degeneration at the former, facet arthropathy at the latter. 3. No convincing spinal stenosis by CT. But moderate right lateral recess and severe right neural foraminal stenosis at L2-L3. Mild left lateral recess stenosis at L3-L4. 4.  Aortic Atherosclerosis (ICD10-I70.0).   PATIENT SURVEYS:  Eval:  Modified Oswestry 17/15 34%   COGNITION: Overall cognitive status: Within functional limits for tasks assessed     SENSATION: WFL   POSTURE: rounded shoulders  SKIN: Incision healing well; no warmth noted  LUMBAR ROM: NT due to post surgery   LOWER EXTREMITY ROM:   WFL   LOWER EXTREMITY MMT:  Grossly 4-/5   FUNCTIONAL TESTS:  Eval: 5 times sit to stand: 24.35 sec w. UE support Timed up and go (TUG): 27.10 sec with str cane. Pt experienced some dizziness after completing turn  03/21/2024: Modified CTSIB:  Condition 1- 30 sec (but self corrected loss of balance at 20 sec), Condition 2- 5 sec, Condition 3- 30 sec (with increased sway), Condition 4- 2 sec.  Total-  67/120    03/23/2024 6 MWT with str cane:632ft- increased anterior Lt hip pain at 2 minute mark. Patient frequently touches walls when ambulating. She verbalized dizziness when people were walking around her.    04/12/2024: 5 times sit to stand: 11.77 sec with pain of 1/10 "it's more of a throb, not sharp pain." Timed up and go (TUG): 12.69 sec without assistive device (with slight balance "check" with 180 degree turn)  04/19/2024: 5 times sit to stand: 10.99 sec Timed up and go (TUG): 10.14 sec with some slight self corrected "balance checks" Modified CTSIB:  Condition 1- 30 sec (very slight balance adjustments), Condition 2- 17 sec, Condition 3- 30 sec (with increased sway), Condition 4- 5 sec.  Total- 82/120  GAIT: Distance walked: 20ft Assistive device utilized: Single point cane Level of assistance: Complete Independence Comments: decreased step length; patient experiences mild dizziness when turning  VESTIBULAR ASSESSMENT: 03/21/2024: Smooth pursuit:  nystagmus noted when looking to the right and up.  No nystagmus looking left Saccades:  WNL Gaze stabilization is WNL  Head Impulse Test:  positive bilaterally, worse on right than left [place] negative bilaterally   TREATMENT DATE:  04/24/2024: Nustep level 5 x6  min with PT present to discuss status Unilateral 5# KB hold + BOSU tap x 20 bilateral  Standing on foam pad: shoulder rows with red tband 2x10 Standing on foam pad: shoulder extension with red tband 2x10 Standing on airex 5# kettlebell around the worlds x 10 each direction Standing on airex + march holding 5# kettlebell (Farmer's carry) unilateral x 20 Standing on foam pad with eyes closed in parallel bars with CGA/min A from PT 3x20 sec Overhead 2# DB march x 10 bilateral  Standing on mini trampoline:  3 way weight shift 2x30 sec each (with UE support as needed) Mini squats on mini trampoline x 10 FWD step over 4 small hurdles in parallel bars down and back x3 laps  (step over method) Standing chest press on airex 2# 2 x 10 Standing hip flexor stretch with one foot on 2nd step x20 sec bilat     04/21/2024: Nustep level 5 x6  min with PT present to discuss status Tandem walking on AirEx beam in parallel bars with UE support down and back x3 laps Side stepping on AirEx beam in parallel bars with UE support down and back x3 laps Standing on foam pad: shoulder rows with red tband 2x10 Standing on foam pad: shoulder extension with red tband 2x10 Standing on airex 5# kettlebell around the worlds x 10 each direction Standing on airex + march holding 5# kettlebell (Farmer's carry) unilateral x 20 Standing on foam pad with eyes closed in parallel bars with CGA/min A from PT 3x20 sec Standing ball toss x20 tosses (in parallel bars for support, if needed) FWD step over 4 small hurdles in parallel bars down and back x3 laps Side stepping  over 4 small hurdles in parallel bars down and back x3 laps Standing on mini trampoline:  3 way weight shift 2x30 sec each (with UE support as needed) Standing hip flexor stretch with one foot on 2nd step x20 sec bilat   04/19/2024: Nustep level 5 x6  min with PT present to discuss status 5 times sit to stand and TUG Modified CTSIB 82/120 Tandem walking on AirEx beam in parallel bars with UE support down and back x3 laps Side stepping on AirEx beam in parallel bars with UE support down and back x3 laps Standing on foam pad: shoulder rows with red tband 2x10 Standing on foam pad: shoulder extension with red tband 2x10 Standing on airex 5# kettlebell around the worlds x 10 each direction Standing on airex + march holding 5# kettlebell (Farmer's carry) unilateral x 20 Seated on green physioball (PT immediately seated behind patient and patient with hand support of PT mat beside patient):  marching 2x10, LAQ 2x10 bilat, shoulder flexion with 1# dumbbells 2x10 Standing hip flexor stretch with one foot on 2nd step x20 sec  bilat     PATIENT EDUCATION:  Education details: Spinal precautions; log roll; POC Person educated: Patient Education method: Explanation, Demonstration, and Handouts Education comprehension: verbalized understanding, returned demonstration, and needs further education  HOME EXERCISE PROGRAM: Access Code: [email or web address] URL: [email or web address] Date: 03/21/2024 Prepared by: Chaneta Comer Menke  Exercises - Supine Transversus Abdominis Bracing - Hands on Stomach  - 1 x daily - 7 x weekly - 2 sets - 10 reps - 4 sec hold - Supine Diaphragmatic Breathing  - 1 x daily - 7 x weekly - 1  sets - 10 reps - Seated Long Arc Quad  - 1 x daily - 7 x weekly - 2 sets - 10 reps - 2 hold - Standing Heel Raise with Support  - 1 x daily - 7 x weekly - 2 sets - 10 reps - Seated Horizontal Smooth Pursuit  - 1 x daily - 7 x weekly - 2 sets - 10 reps - Seated Vertical Smooth Pursuit  - 1 x daily - 7 x weekly - 2 sets - 10 reps - Seated Proximal-Distal Smooth Pursuit  - 1 x daily - 7 x weekly - 2 sets - 10 reps - Seated Gaze Stabilization with Head Rotation  - 1 x daily - 7 x weekly - 2 sets - 30 sec hold - Seated Gaze Stabilization with Head Nod  - 1 x daily - 7 x weekly - 2 sets - 30 sec hold  ASSESSMENT:  CLINICAL IMPRESSION: Ms Elizarraras presents to skilled PT with no increased pain or dizziness. She verbalized that her back felt more sore today because she forgot to take her back brace to band practice today. Educated patient that her muscles had to stabilize her more without the brace and that is why she is more sore. Patient verbalized understanding. Noted improved performance with eyed closed on airex. Patient required occasional verbal cues for core engagement with standing balance exercises. Patient should continue to progress well with physical therapy.     OBJECTIVE IMPAIRMENTS: Abnormal gait, decreased balance, decreased coordination, difficulty walking, decreased ROM, decreased strength,  dizziness, increased muscle spasms, postural dysfunction, and pain.   ACTIVITY LIMITATIONS: carrying, lifting, bending, sitting, standing, squatting, transfers, and bed mobility  PARTICIPATION LIMITATIONS: cleaning, laundry, shopping, community activity, yard work, and church  PERSONAL FACTORS: Age, Fitness, and 1-2 comorbidities: Depression; osteoporosis are also affecting patient's functional outcome.   REHAB POTENTIAL: Good  CLINICAL DECISION MAKING: Evolving/moderate complexity  EVALUATION COMPLEXITY: Moderate   GOALS: Goals reviewed with patient? Yes  SHORT TERM GOALS: Target date: 04/17/2024  Patient will be independent with initial HEP. Baseline:  Goal status: Met on 03/27/24  2.  Patient will report > or = to 40% improvement in functional activity since starting PT. Baseline:  Goal status: Met on 03/27/24  3.  Patient to undergo vestibular assessment by vestibular physical therapist. Baseline:  Goal status: Met on 03/21/2024  4.  Patient will be able to perform STS with no UE support due to improved LE strength. Baseline:  Goal status: Met on 03/27/24   LONG TERM GOALS: Target date: 05/15/2024 Patient will demonstrate independence in advanced HEP. Baseline:  Goal status: Ongoing  2.  Patient will demonstrate improved walking tolerance and be able to grocery shop with minimal limitations due to her back pain. Baseline:  Goal status: Ongoing  3.  Patient will verbalize and demonstrate self-care strategies to manage pain including tissue mobility practices and change of position. Baseline:  Goal status: INITIAL  3.  Patient will reports > or = 70% improvement in her dizziness since starting PT. Baseline:  Goal status: Met on 04/12/24 (with patient reporting has improved 75% to her baseline)  4.  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  Baseline:  Goal status: Met on 04/21/24  5.  Patient will  perform tug in < or = to 17 sec for decreased falls risk.  Baseline: 27.10sec Goal status: Partially Met (met for time, but one slight  bobble with 180 degree turn on 04/12/24)    PLAN:  PT FREQUENCY: 2x/week  PT DURATION: 8 weeks  PLANNED INTERVENTIONS: 97164- PT Re-evaluation, 97110-Therapeutic exercises, 97530- Therapeutic activity, 97112- Neuromuscular re-education, 97535- Self Care, 21308- Manual therapy, [phone]- Gait training, [phone]- Orthotic Fit/training, [phone]- Canalith repositioning, [id number]- Aquatic Therapy, [phone]- Electrical stimulation (unattended), 97016- Vasopneumatic device, [id number]- Ultrasound, [id number]- Ionotophoresis 4mg /ml Dexamethasone , Patient/Family education, Balance training, Stair training, Taping, Dry Needling, Joint mobilization, Joint manipulation, Vestibular training, Cryotherapy, and Moist heat.  PLAN FOR NEXT SESSION:  continue functional & core strengthening and balance   Penelope Bowie, PT 04/24/24 2:44 PM   Penelope Bowie, PT 04/24/24 2:44 PM [facility] [address] [place], [place] [address] Phone # [phone] Fax [phone]

## 2024-04-26 ENCOUNTER — Ambulatory Visit: Admitting: Rehabilitative and Restorative Service Providers"

## 2024-04-26 ENCOUNTER — Encounter: Payer: Self-pay | Admitting: Rehabilitative and Restorative Service Providers"

## 2024-04-26 DIAGNOSIS — R42 Dizziness and giddiness: Secondary | ICD-10-CM

## 2024-04-26 DIAGNOSIS — M5416 Radiculopathy, lumbar region: Secondary | ICD-10-CM | POA: Diagnosis not present

## 2024-04-26 DIAGNOSIS — R2681 Unsteadiness on feet: Secondary | ICD-10-CM

## 2024-04-26 DIAGNOSIS — M6281 Muscle weakness (generalized): Secondary | ICD-10-CM

## 2024-04-26 DIAGNOSIS — R2689 Other abnormalities of gait and mobility: Secondary | ICD-10-CM

## 2024-04-26 NOTE — Therapy (Signed)
 OUTPATIENT PHYSICAL THERAPY TREATMENT NOTE   Patient Name: Lori Frost MRN: [id number] DOB:Jun 26, 1946, 78 y.o., female Today's Date: 04/26/2024    END OF SESSION:  PT End of Session - 04/26/24 0931     Visit Number 13    Date for PT Re-Evaluation 05/15/24    Authorization Type HTA    Progress Note Due on Visit 20    PT Start Time 0928    PT Stop Time 1010    PT Time Calculation (min) 42 min    Activity Tolerance Patient tolerated treatment well    Behavior During Therapy WFL for tasks assessed/performed                Past Medical History:  Diagnosis Date   Allergy    Non-Allergic Rhinitis   Balance problem due to vestibular dysfunction    Bloating    CAD (coronary artery disease)    Carotid artery occlusion    Cataract    Chordae tendineae rupture (HCC)    Cyst of ovary, right    Depression    Diverticulosis    DJD (degenerative joint disease)    Eczema    Family history of adverse reaction to anesthesia    SISTER "HEART STOPPED DURING [id number]"1478 UNKNOWN CAUSE -  SHE DIED 10 YRS LATER OF HEART ATTACK   GERD (gastroesophageal reflux disease)    ESOPHAGITIS PRESENCE NOT SPECIFIED   Granuloma annulare    History of hiatal hernia    History of recurrent UTI (urinary tract infection)    Hypercholesterolemia    Hyperlipidemia    Lower back pain    Meniere's disease    Migraine    Mitral valve regurgitation    MODERATE NORMAL, MILD AORTIC REGURGITATION, MILD TRICUSPID REGURGITATION, LVH, GRADE 1 DIASTOLIC DYSFUNCTION, ECHO 2018 SUGGESTS RUPTURED CHORDAE TENDINEAE ( DR. Anastasia Balo)   MVP (mitral valve prolapse)    Myalgia    OA (osteoarthritis) of hip 02/12/2016   Osteoporosis    Other nonrheumatic mitral valve disorders    Psoriasis    Pupil dilated    DUE TO EYE DROPS FOR TX OF RT  EYE PROBLEM   Reactive hypoglycemia    Sensitive skin    Shingles    Vertigo    Vestibular neuronitis    Vitamin D deficiency    Past Surgical History:  Procedure  Laterality Date   EYE SURGERY Right 02-05-15   Cataract   EYE SURGERY Left 02-21-15   Cataract   TONSILLECTOMY     TOTAL HIP ARTHROPLASTY Right 02/12/2016   Procedure: RIGHT TOTAL HIP ARTHROPLASTY ANTERIOR APPROACH;  Surgeon: Liliane Rei, MD;  Location: [facility];  Service: Orthopedics;  Laterality: Right;   Patient Active Problem List   Diagnosis Date Noted   S/P lumbar fusion 02/18/2024   Mitral valve regurgitation 08/14/2021   Coronary artery calcification 08/14/2021   Aortic atherosclerosis (HCC) 08/14/2021   Hyperlipidemia 08/14/2021   Carotid artery disease (HCC) 08/14/2021   OA (osteoarthritis) of hip 02/12/2016    PCP: Elner Hahn, MD  REFERRING PROVIDER: Joaquin Mulberry, MD  REFERRING DIAG: M54.16 (ICD-10-CM) - Radiculopathy, lumbar region  Rationale for Evaluation and Treatment: Rehabilitation  THERAPY DIAG:  Radiculopathy, lumbar region  Muscle weakness (generalized)  Unsteadiness on feet  Dizziness and giddiness  Other abnormalities of gait and mobility  ONSET DATE: Surgery Date 02/18/2024  SUBJECTIVE:  SUBJECTIVE STATEMENT: Patient reports she is trying to not wear her back brace all of the time now.  PERTINENT HISTORY:  Depression; osteoporosis; vertigo; Meniere's disease; hx Rt total hip arthroplasty ; scoliosis   PAIN:  Are you having pain? Yes: NPRS scale: 2/ 10  Pain location: back and shoulder blades Pain description: throbbing; burning; sore Aggravating factors: continuous movement; reaching in cabinets; walking in grocery store  Relieving factors: laying down  PRECAUTIONS: Back lumbar fusion 3/7/225. Lifting < 10 lbs  RED FLAGS: None   WEIGHT BEARING RESTRICTIONS: No  FALLS:  Has patient fallen in last 6 months? No  LIVING ENVIRONMENT: Lives with:  lives alone Lives in: House/apartment Stairs: Yes: Internal: 1  steps; none Has following equipment at home: Single point cane  OCCUPATION: Retired  PLOF: Independent, Independent with basic ADLs, Independent with household mobility without device, and Independent with gait  PATIENT GOALS: Return to activities she does at her church; Return to [employer]; shop without issues;   NEXT MD VISIT: July 2025 with Dr Rochelle Chu  OBJECTIVE:  Note: Objective measures were completed at Evaluation unless otherwise noted.  DIAGNOSTIC FINDINGS:  12/29/2023 Lumbar CT without contrast  IMPRESSION: 1. Transitional lumbosacral anatomy. Designating normal lumbar segmentation results in hypoplastic ribs at L1. Correlation with radiographs is recommended prior to any operative intervention. 2. Levoconvex lumbar scoliosis with degenerative spondylolisthesis at L2-L3, L5-S1. Advanced endplate degeneration at the former, facet arthropathy at the latter. 3. No convincing spinal stenosis by CT. But moderate right lateral recess and severe right neural foraminal stenosis at L2-L3. Mild left lateral recess stenosis at L3-L4. 4.  Aortic Atherosclerosis (ICD10-I70.0).   PATIENT SURVEYS:  Eval:  Modified Oswestry 17/15 34%   COGNITION: Overall cognitive status: Within functional limits for tasks assessed     SENSATION: WFL   POSTURE: rounded shoulders  SKIN: Incision healing well; no warmth noted  LUMBAR ROM: NT due to post surgery   LOWER EXTREMITY ROM:   WFL   LOWER EXTREMITY MMT:  Grossly 4-/5   FUNCTIONAL TESTS:  Eval: 5 times sit to stand: 24.35 sec w. UE support Timed up and go (TUG): 27.10 sec with str cane. Pt experienced some dizziness after completing turn  03/21/2024: Modified CTSIB:  Condition 1- 30 sec (but self corrected loss of balance at 20 sec), Condition 2- 5 sec, Condition 3- 30 sec (with increased sway), Condition 4- 2 sec.  Total- 67/120    03/23/2024 6 MWT  with str cane:660ft- increased anterior Lt hip pain at 2 minute mark. Patient frequently touches walls when ambulating. She verbalized dizziness when people were walking around her.    04/12/2024: 5 times sit to stand: 11.77 sec with pain of 1/10 "it's more of a throb, not sharp pain." Timed up and go (TUG): 12.69 sec without assistive device (with slight balance "check" with 180 degree turn)  04/19/2024: 5 times sit to stand: 10.99 sec Timed up and go (TUG): 10.14 sec with some slight self corrected "balance checks" Modified CTSIB:  Condition 1- 30 sec (very slight balance adjustments), Condition 2- 17 sec, Condition 3- 30 sec (with increased sway), Condition 4- 5 sec.  Total- 82/120  GAIT: Distance walked: 84ft Assistive device utilized: Single point cane Level of assistance: Complete Independence Comments: decreased step length; patient experiences mild dizziness when turning  VESTIBULAR ASSESSMENT: 03/21/2024: Smooth pursuit:  nystagmus noted when looking to the right and up.  No nystagmus looking left Saccades:  WNL Gaze stabilization is WNL Head  Impulse Test:  positive bilaterally, worse on right than left [place] negative bilaterally   TREATMENT DATE:  04/26/2024: Nustep level 5 x6  min with PT present to discuss status Unilateral 5# KB hold + BOSU tap x 20 bilateral  Standing on flat side of bosu with UE support as needed 3x30 sec Standing on airex 5# kettlebell around the worlds x 10 each direction Standing on airex + march holding 5# kettlebell (Farmer's carry) unilateral x 20 Tandem walking on AirEx beam in parallel bars with UE support, as needed.  Down and back x3 Side stepping on AirEx beam in parallel bars with UE support, as needed.  Down and back x3 Step down from 4" step x10 bilat Single leg stance on foam pod performing clock taps (12/3/6) 2x5 blat (with UE support of parallel bars, as needed) Alt toe taps to 2 cones on floor 2x5 with UE support of parallel  bars, as needed Seated on dynadisc performing marching and LAQ.  2x5 bilat   04/24/2024: Nustep level 5 x6  min with PT present to discuss status Unilateral 5# KB hold + BOSU tap x 20 bilateral  Standing on foam pad: shoulder rows with red tband 2x10 Standing on foam pad: shoulder extension with red tband 2x10 Standing on airex 5# kettlebell around the worlds x 10 each direction Standing on airex + march holding 5# kettlebell (Farmer's carry) unilateral x 20 Standing on foam pad with eyes closed in parallel bars with CGA/min A from PT 3x20 sec Overhead 2# DB march x 10 bilateral  Standing on mini trampoline:  3 way weight shift 2x30 sec each (with UE support as needed) Mini squats on mini trampoline x 10 FWD step over 4 small hurdles in parallel bars down and back x3 laps (step over method) Standing chest press on airex 2# 2 x 10 Standing hip flexor stretch with one foot on 2nd step x20 sec bilat     04/21/2024: Nustep level 5 x6  min with PT present to discuss status Tandem walking on AirEx beam in parallel bars with UE support down and back x3 laps Side stepping on AirEx beam in parallel bars with UE support down and back x3 laps Standing on foam pad: shoulder rows with red tband 2x10 Standing on foam pad: shoulder extension with red tband 2x10 Standing on airex 5# kettlebell around the worlds x 10 each direction Standing on airex + march holding 5# kettlebell (Farmer's carry) unilateral x 20 Standing on foam pad with eyes closed in parallel bars with CGA/min A from PT 3x20 sec Standing ball toss x20 tosses (in parallel bars for support, if needed) FWD step over 4 small hurdles in parallel bars down and back x3 laps Side stepping  over 4 small hurdles in parallel bars down and back x3 laps Standing on mini trampoline:  3 way weight shift 2x30 sec each (with UE support as needed) Standing hip flexor stretch with one foot on 2nd step x20 sec bilat     PATIENT EDUCATION:   Education details: Spinal precautions; log roll; POC Person educated: Patient Education method: Explanation, Demonstration, and Handouts Education comprehension: verbalized understanding, returned demonstration, and needs further education  HOME EXERCISE PROGRAM: Access Code: [email or web address] URL: [email or web address] Date: 03/21/2024 Prepared by: Chaneta Comer Deago Burruss  Exercises - Supine Transversus Abdominis Bracing - Hands on Stomach  - 1 x daily - 7 x weekly - 2 sets - 10 reps - 4 sec hold - Supine Diaphragmatic Breathing  - 1 x  daily - 7 x weekly - 1 sets - 10 reps - Seated Long Arc Quad  - 1 x daily - 7 x weekly - 2 sets - 10 reps - 2 hold - Standing Heel Raise with Support  - 1 x daily - 7 x weekly - 2 sets - 10 reps - Seated Horizontal Smooth Pursuit  - 1 x daily - 7 x weekly - 2 sets - 10 reps - Seated Vertical Smooth Pursuit  - 1 x daily - 7 x weekly - 2 sets - 10 reps - Seated Proximal-Distal Smooth Pursuit  - 1 x daily - 7 x weekly - 2 sets - 10 reps - Seated Gaze Stabilization with Head Rotation  - 1 x daily - 7 x weekly - 2 sets - 30 sec hold - Seated Gaze Stabilization with Head Nod  - 1 x daily - 7 x weekly - 2 sets - 30 sec hold  ASSESSMENT:  CLINICAL IMPRESSION: Ms Mancil presents to skilled PT with minimal reports of back pain today.  Patient able to progress with balance tasks today.  Patient continues to require UE support for various balance tasks throughout session.  Patient able to progress through entire session without use of back brace, only reported occasional discomfort.  Patient continues to require skilled PT to progress towards goal related activities.     OBJECTIVE IMPAIRMENTS: Abnormal gait, decreased balance, decreased coordination, difficulty walking, decreased ROM, decreased strength, dizziness, increased muscle spasms, postural dysfunction, and pain.   ACTIVITY LIMITATIONS: carrying, lifting, bending, sitting, standing, squatting, transfers, and  bed mobility  PARTICIPATION LIMITATIONS: cleaning, laundry, shopping, community activity, yard work, and church  PERSONAL FACTORS: Age, Fitness, and 1-2 comorbidities: Depression; osteoporosis are also affecting patient's functional outcome.   REHAB POTENTIAL: Good  CLINICAL DECISION MAKING: Evolving/moderate complexity  EVALUATION COMPLEXITY: Moderate   GOALS: Goals reviewed with patient? Yes  SHORT TERM GOALS: Target date: 04/17/2024  Patient will be independent with initial HEP. Baseline:  Goal status: Met on 03/27/24  2.  Patient will report > or = to 40% improvement in functional activity since starting PT. Baseline:  Goal status: Met on 03/27/24  3.  Patient to undergo vestibular assessment by vestibular physical therapist. Baseline:  Goal status: Met on 03/21/2024  4.  Patient will be able to perform STS with no UE support due to improved LE strength. Baseline:  Goal status: Met on 03/27/24   LONG TERM GOALS: Target date: 05/15/2024 Patient will demonstrate independence in advanced HEP. Baseline:  Goal status: Ongoing  2.  Patient will demonstrate improved walking tolerance and be able to grocery shop with minimal limitations due to her back pain. Baseline:  Goal status: Ongoing  3.  Patient will verbalize and demonstrate self-care strategies to manage pain including tissue mobility practices and change of position. Baseline:  Goal status: INITIAL  3.  Patient will reports > or = 70% improvement in her dizziness since starting PT. Baseline:  Goal status: Met on 04/12/24 (with patient reporting has improved 75% to her baseline)  4.  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  Baseline:  Goal status: Met on 04/21/24  5.  Patient will perform tug in < or = to 17 sec for decreased falls risk.  Baseline: 27.10sec Goal status: Partially Met (met for time, but one slight bobble with 180 degree turn on  04/12/24)    PLAN:  PT FREQUENCY:  2x/week  PT DURATION: 8 weeks  PLANNED INTERVENTIONS: 97164- PT Re-evaluation, 97110-Therapeutic exercises, 97530- Therapeutic activity, 97112- Neuromuscular re-education, 97535- Self Care, 16109- Manual therapy, [phone]- Gait training, [phone]- Orthotic Fit/training, [phone]- Canalith repositioning, [id number]- Aquatic Therapy, [phone]- Electrical stimulation (unattended), 97016- Vasopneumatic device, [id number]- Ultrasound, [id number]- Ionotophoresis 4mg /ml Dexamethasone , Patient/Family education, Balance training, Stair training, Taping, Dry Needling, Joint mobilization, Joint manipulation, Vestibular training, Cryotherapy, and Moist heat.  PLAN FOR NEXT SESSION:  continue functional & core strengthening and balance   Robyne Christen, PT, DPT 04/26/24, 10:17 AM  [facility] [address] [place], [place] [address] Phone # [phone] Fax [phone]

## 2024-05-01 ENCOUNTER — Encounter: Payer: Self-pay | Admitting: Physical Therapy

## 2024-05-01 ENCOUNTER — Ambulatory Visit: Admitting: Physical Therapy

## 2024-05-01 DIAGNOSIS — M5416 Radiculopathy, lumbar region: Secondary | ICD-10-CM

## 2024-05-01 DIAGNOSIS — R2681 Unsteadiness on feet: Secondary | ICD-10-CM

## 2024-05-01 DIAGNOSIS — R2689 Other abnormalities of gait and mobility: Secondary | ICD-10-CM

## 2024-05-01 DIAGNOSIS — M6281 Muscle weakness (generalized): Secondary | ICD-10-CM

## 2024-05-01 DIAGNOSIS — R42 Dizziness and giddiness: Secondary | ICD-10-CM

## 2024-05-01 NOTE — Therapy (Signed)
 OUTPATIENT PHYSICAL THERAPY TREATMENT NOTE   Patient Name: Lori Frost MRN: [id number] DOB:13-Dec-1946, 78 y.o., female Today's Date: 05/01/2024    END OF SESSION:  PT End of Session - 05/01/24 1542     Visit Number 14    Date for PT Re-Evaluation 05/15/24    Authorization Type HTA    Progress Note Due on Visit 20    PT Start Time 1448    PT Stop Time 1531    PT Time Calculation (min) 43 min    Activity Tolerance Patient tolerated treatment well    Behavior During Therapy WFL for tasks assessed/performed                 Past Medical History:  Diagnosis Date   Allergy    Non-Allergic Rhinitis   Balance problem due to vestibular dysfunction    Bloating    CAD (coronary artery disease)    Carotid artery occlusion    Cataract    Chordae tendineae rupture (HCC)    Cyst of ovary, right    Depression    Diverticulosis    DJD (degenerative joint disease)    Eczema    Family history of adverse reaction to anesthesia    SISTER "HEART STOPPED DURING [id number]"8469 UNKNOWN CAUSE -  SHE DIED 10 YRS LATER OF HEART ATTACK   GERD (gastroesophageal reflux disease)    ESOPHAGITIS PRESENCE NOT SPECIFIED   Granuloma annulare    History of hiatal hernia    History of recurrent UTI (urinary tract infection)    Hypercholesterolemia    Hyperlipidemia    Lower back pain    Meniere's disease    Migraine    Mitral valve regurgitation    MODERATE NORMAL, MILD AORTIC REGURGITATION, MILD TRICUSPID REGURGITATION, LVH, GRADE 1 DIASTOLIC DYSFUNCTION, ECHO 2018 SUGGESTS RUPTURED CHORDAE TENDINEAE ( DR. Anastasia Balo)   MVP (mitral valve prolapse)    Myalgia    OA (osteoarthritis) of hip 02/12/2016   Osteoporosis    Other nonrheumatic mitral valve disorders    Psoriasis    Pupil dilated    DUE TO EYE DROPS FOR TX OF RT  EYE PROBLEM   Reactive hypoglycemia    Sensitive skin    Shingles    Vertigo    Vestibular neuronitis    Vitamin D deficiency    Past Surgical History:  Procedure  Laterality Date   EYE SURGERY Right 02-05-15   Cataract   EYE SURGERY Left 02-21-15   Cataract   TONSILLECTOMY     TOTAL HIP ARTHROPLASTY Right 02/12/2016   Procedure: RIGHT TOTAL HIP ARTHROPLASTY ANTERIOR APPROACH;  Surgeon: Liliane Rei, MD;  Location: [facility];  Service: Orthopedics;  Laterality: Right;   Patient Active Problem List   Diagnosis Date Noted   S/P lumbar fusion 02/18/2024   Mitral valve regurgitation 08/14/2021   Coronary artery calcification 08/14/2021   Aortic atherosclerosis (HCC) 08/14/2021   Hyperlipidemia 08/14/2021   Carotid artery disease (HCC) 08/14/2021   OA (osteoarthritis) of hip 02/12/2016    PCP: Elner Hahn, MD  REFERRING PROVIDER: Joaquin Mulberry, MD  REFERRING DIAG: M54.16 (ICD-10-CM) - Radiculopathy, lumbar region  Rationale for Evaluation and Treatment: Rehabilitation  THERAPY DIAG:  Radiculopathy, lumbar region  Muscle weakness (generalized)  Unsteadiness on feet  Dizziness and giddiness  Other abnormalities of gait and mobility  ONSET DATE: Surgery Date 02/18/2024  SUBJECTIVE:  SUBJECTIVE STATEMENT: Patient reports she is doing okay today. She has an appointment scheduled tomorrow to discuss her upper thoracic pain.  PERTINENT HISTORY:  Depression; osteoporosis; vertigo; Meniere's disease; hx Rt total hip arthroplasty ; scoliosis   PAIN:  Are you having pain? Yes: NPRS scale: 2/ 10  Pain location: back and shoulder blades Pain description: throbbing; burning; sore Aggravating factors: continuous movement; reaching in cabinets; walking in grocery store  Relieving factors: laying down  PRECAUTIONS: Back lumbar fusion 3/7/225. Lifting < 10 lbs  RED FLAGS: None   WEIGHT BEARING RESTRICTIONS: No  FALLS:  Has patient fallen in last 6 months?  No  LIVING ENVIRONMENT: Lives with: lives alone Lives in: House/apartment Stairs: Yes: Internal: 1  steps; none Has following equipment at home: Single point cane  OCCUPATION: Retired  PLOF: Independent, Independent with basic ADLs, Independent with household mobility without device, and Independent with gait  PATIENT GOALS: Return to activities she does at her church; Return to [employer]; shop without issues;   NEXT MD VISIT: July 2025 with Dr Rochelle Chu  OBJECTIVE:  Note: Objective measures were completed at Evaluation unless otherwise noted.  DIAGNOSTIC FINDINGS:  12/29/2023 Lumbar CT without contrast  IMPRESSION: 1. Transitional lumbosacral anatomy. Designating normal lumbar segmentation results in hypoplastic ribs at L1. Correlation with radiographs is recommended prior to any operative intervention. 2. Levoconvex lumbar scoliosis with degenerative spondylolisthesis at L2-L3, L5-S1. Advanced endplate degeneration at the former, facet arthropathy at the latter. 3. No convincing spinal stenosis by CT. But moderate right lateral recess and severe right neural foraminal stenosis at L2-L3. Mild left lateral recess stenosis at L3-L4. 4.  Aortic Atherosclerosis (ICD10-I70.0).   PATIENT SURVEYS:  Eval:  Modified Oswestry 17/15 34%   COGNITION: Overall cognitive status: Within functional limits for tasks assessed     SENSATION: WFL   POSTURE: rounded shoulders  SKIN: Incision healing well; no warmth noted  LUMBAR ROM: NT due to post surgery   LOWER EXTREMITY ROM:   WFL   LOWER EXTREMITY MMT:  Grossly 4-/5   FUNCTIONAL TESTS:  Eval: 5 times sit to stand: 24.35 sec w. UE support Timed up and go (TUG): 27.10 sec with str cane. Pt experienced some dizziness after completing turn  03/21/2024: Modified CTSIB:  Condition 1- 30 sec (but self corrected loss of balance at 20 sec), Condition 2- 5 sec, Condition 3- 30 sec (with increased sway), Condition 4- 2  sec.  Total- 67/120    03/23/2024 6 MWT with str cane:618ft- increased anterior Lt hip pain at 2 minute mark. Patient frequently touches walls when ambulating. She verbalized dizziness when people were walking around her.    04/12/2024: 5 times sit to stand: 11.77 sec with pain of 1/10 "it's more of a throb, not sharp pain." Timed up and go (TUG): 12.69 sec without assistive device (with slight balance "check" with 180 degree turn)  04/19/2024: 5 times sit to stand: 10.99 sec Timed up and go (TUG): 10.14 sec with some slight self corrected "balance checks" Modified CTSIB:  Condition 1- 30 sec (very slight balance adjustments), Condition 2- 17 sec, Condition 3- 30 sec (with increased sway), Condition 4- 5 sec.  Total- 82/120  GAIT: Distance walked: 66ft Assistive device utilized: Single point cane Level of assistance: Complete Independence Comments: decreased step length; patient experiences mild dizziness when turning  VESTIBULAR ASSESSMENT: 03/21/2024: Smooth pursuit:  nystagmus noted when looking to the right and up.  No nystagmus looking left Saccades:  WNL Gaze stabilization  is WNL Head Impulse Test:  positive bilaterally, worse on right than left [place] negative bilaterally   TREATMENT DATE:  05/01/2024: Nustep level 5 x6  min with PT present to discuss status Unilateral 5# KB hold + BOSU tap x 20 bilateral  Standing on flat side of bosu with UE support as needed 3x30 sec 5 cones of different colors (PT verbalizing color) pt tapping with single leg no UE support x 1 min Foot on purple/pink dynadisc step overs with one leg at a time x 10 Lateral lunge to purple/pink dynadisc x 10 Marching on airex with unilateral 5# DB hold x 10 waist height x 10 shoulder height Standing on airex 5# kettlebell around the worlds x 10 each direction Manual: Addaday to right hip for improved circulation and muscle length. PT present to monitor status    04/26/2024: Nustep level 5 x6  min  with PT present to discuss status Unilateral 5# KB hold + BOSU tap x 20 bilateral  Standing on flat side of bosu with UE support as needed 3x30 sec Standing on airex 5# kettlebell around the worlds x 10 each direction Standing on airex + march holding 5# kettlebell (Farmer's carry) unilateral x 20 Tandem walking on AirEx beam in parallel bars with UE support, as needed.  Down and back x3 Side stepping on AirEx beam in parallel bars with UE support, as needed.  Down and back x3 Step down from 4" step x10 bilat Single leg stance on foam pod performing clock taps (12/3/6) 2x5 blat (with UE support of parallel bars, as needed) Alt toe taps to 2 cones on floor 2x5 with UE support of parallel bars, as needed Seated on dynadisc performing marching and LAQ.  2x5 bilat   04/24/2024: Nustep level 5 x6  min with PT present to discuss status Unilateral 5# KB hold + BOSU tap x 20 bilateral  Standing on foam pad: shoulder rows with red tband 2x10 Standing on foam pad: shoulder extension with red tband 2x10 Standing on airex 5# kettlebell around the worlds x 10 each direction Standing on airex + march holding 5# kettlebell (Farmer's carry) unilateral x 20 Standing on foam pad with eyes closed in parallel bars with CGA/min A from PT 3x20 sec Overhead 2# DB march x 10 bilateral  Standing on mini trampoline:  3 way weight shift 2x30 sec each (with UE support as needed) Mini squats on mini trampoline x 10 FWD step over 4 small hurdles in parallel bars down and back x3 laps (step over method) Standing chest press on airex 2# 2 x 10 Standing hip flexor stretch with one foot on 2nd step x20 sec bilat      PATIENT EDUCATION:  Education details: Spinal precautions; log roll; POC Person educated: Patient Education method: Explanation, Demonstration, and Handouts Education comprehension: verbalized understanding, returned demonstration, and needs further education  HOME EXERCISE PROGRAM: Access Code:  [email or web address] URL: [email or web address] Date: 03/21/2024 Prepared by: Chaneta Comer Menke  Exercises - Supine Transversus Abdominis Bracing - Hands on Stomach  - 1 x daily - 7 x weekly - 2 sets - 10 reps - 4 sec hold - Supine Diaphragmatic Breathing  - 1 x daily - 7 x weekly - 1 sets - 10 reps - Seated Long Arc Quad  - 1 x daily - 7 x weekly - 2 sets - 10 reps - 2 hold - Standing Heel Raise with Support  - 1 x daily - 7 x weekly - 2 sets - 10  reps - Seated Horizontal Smooth Pursuit  - 1 x daily - 7 x weekly - 2 sets - 10 reps - Seated Vertical Smooth Pursuit  - 1 x daily - 7 x weekly - 2 sets - 10 reps - Seated Proximal-Distal Smooth Pursuit  - 1 x daily - 7 x weekly - 2 sets - 10 reps - Seated Gaze Stabilization with Head Rotation  - 1 x daily - 7 x weekly - 2 sets - 30 sec hold - Seated Gaze Stabilization with Head Nod  - 1 x daily - 7 x weekly - 2 sets - 30 sec hold  ASSESSMENT:  CLINICAL IMPRESSION: Ms Alper presents to skilled PT with complaints of more unsteadiness than normal. While ambulating she frequently reached out to maintain balance. Performed manual therapy techniques to right hip for decreased pain and improved muscle elongation. She has an appointment tomorrow with her provider to discuss her upper thoracic pain. Overall, she tolerated treatment session well. Patient is on track to meet goals by end of POC.     OBJECTIVE IMPAIRMENTS: Abnormal gait, decreased balance, decreased coordination, difficulty walking, decreased ROM, decreased strength, dizziness, increased muscle spasms, postural dysfunction, and pain.   ACTIVITY LIMITATIONS: carrying, lifting, bending, sitting, standing, squatting, transfers, and bed mobility  PARTICIPATION LIMITATIONS: cleaning, laundry, shopping, community activity, yard work, and church  PERSONAL FACTORS: Age, Fitness, and 1-2 comorbidities: Depression; osteoporosis are also affecting patient's functional outcome.   REHAB POTENTIAL:  Good  CLINICAL DECISION MAKING: Evolving/moderate complexity  EVALUATION COMPLEXITY: Moderate   GOALS: Goals reviewed with patient? Yes  SHORT TERM GOALS: Target date: 04/17/2024  Patient will be independent with initial HEP. Baseline:  Goal status: Met on 03/27/24  2.  Patient will report > or = to 40% improvement in functional activity since starting PT. Baseline:  Goal status: Met on 03/27/24  3.  Patient to undergo vestibular assessment by vestibular physical therapist. Baseline:  Goal status: Met on 03/21/2024  4.  Patient will be able to perform STS with no UE support due to improved LE strength. Baseline:  Goal status: Met on 03/27/24   LONG TERM GOALS: Target date: 05/15/2024 Patient will demonstrate independence in advanced HEP. Baseline:  Goal status: Ongoing  2.  Patient will demonstrate improved walking tolerance and be able to grocery shop with minimal limitations due to her back pain. Baseline:  Goal status: Ongoing  3.  Patient will verbalize and demonstrate self-care strategies to manage pain including tissue mobility practices and change of position. Baseline:  Goal status: INITIAL  3.  Patient will reports > or = 70% improvement in her dizziness since starting PT. Baseline:  Goal status: Met on 04/12/24 (with patient reporting has improved 75% to her baseline)  4.  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  Baseline:  Goal status: Met on 04/21/24  5.  Patient will perform tug in < or = to 17 sec for decreased falls risk.  Baseline: 27.10sec Goal status: Partially Met (met for time, but one slight bobble with 180 degree turn on 04/12/24)    PLAN:  PT FREQUENCY: 2x/week  PT DURATION: 8 weeks  PLANNED INTERVENTIONS: 97164- PT Re-evaluation, 97110-Therapeutic exercises, 97530- Therapeutic activity, 97112- Neuromuscular re-education, 97535- Self Care, 16109- Manual therapy, [id number]- Gait  training, [phone]- Orthotic Fit/training, [phone]- Canalith repositioning, [id number]- Aquatic Therapy, [id number]- Electrical stimulation (unattended), 97016- Vasopneumatic device, [id number]- Ultrasound, [id number]- Ionotophoresis 4mg /ml Dexamethasone , Patient/Family  education, Balance training, Stair training, Taping, Dry Needling, Joint mobilization, Joint manipulation, Vestibular training, Cryotherapy, and Moist heat.  PLAN FOR NEXT SESSION:  ask about MD appointment; continue functional & core strengthening and balance    Penelope Bowie, PT 05/01/24 3:43 PM [facility] [address], [address] [place], [place] [address] Phone # [phone] Fax [phone]

## 2024-05-02 DIAGNOSIS — M546 Pain in thoracic spine: Secondary | ICD-10-CM | POA: Diagnosis not present

## 2024-05-03 ENCOUNTER — Encounter: Payer: Self-pay | Admitting: Rehabilitative and Restorative Service Providers"

## 2024-05-03 ENCOUNTER — Ambulatory Visit: Admitting: Rehabilitative and Restorative Service Providers"

## 2024-05-03 DIAGNOSIS — R42 Dizziness and giddiness: Secondary | ICD-10-CM

## 2024-05-03 DIAGNOSIS — M6281 Muscle weakness (generalized): Secondary | ICD-10-CM

## 2024-05-03 DIAGNOSIS — R2681 Unsteadiness on feet: Secondary | ICD-10-CM

## 2024-05-03 DIAGNOSIS — M5416 Radiculopathy, lumbar region: Secondary | ICD-10-CM

## 2024-05-03 DIAGNOSIS — R2689 Other abnormalities of gait and mobility: Secondary | ICD-10-CM

## 2024-05-03 NOTE — Therapy (Signed)
 OUTPATIENT PHYSICAL THERAPY TREATMENT NOTE   Patient Name: Lori Frost MRN: [id number] DOB:12/21/1945, 78 y.o., female Today's Date: 05/03/2024    END OF SESSION:  PT End of Session - 05/03/24 0934     Visit Number 15    Date for PT Re-Evaluation 05/15/24    Authorization Type HTA    Progress Note Due on Visit 20    PT Start Time 0930    PT Stop Time 1010    PT Time Calculation (min) 40 min    Activity Tolerance Patient tolerated treatment well    Behavior During Therapy WFL for tasks assessed/performed                 Past Medical History:  Diagnosis Date   Allergy    Non-Allergic Rhinitis   Balance problem due to vestibular dysfunction    Bloating    CAD (coronary artery disease)    Carotid artery occlusion    Cataract    Chordae tendineae rupture (HCC)    Cyst of ovary, right    Depression    Diverticulosis    DJD (degenerative joint disease)    Eczema    Family history of adverse reaction to anesthesia    SISTER "HEART STOPPED DURING [id number]"0347 UNKNOWN CAUSE -  SHE DIED 10 YRS LATER OF HEART ATTACK   GERD (gastroesophageal reflux disease)    ESOPHAGITIS PRESENCE NOT SPECIFIED   Granuloma annulare    History of hiatal hernia    History of recurrent UTI (urinary tract infection)    Hypercholesterolemia    Hyperlipidemia    Lower back pain    Meniere's disease    Migraine    Mitral valve regurgitation    MODERATE NORMAL, MILD AORTIC REGURGITATION, MILD TRICUSPID REGURGITATION, LVH, GRADE 1 DIASTOLIC DYSFUNCTION, ECHO 2018 SUGGESTS RUPTURED CHORDAE TENDINEAE ( DR. Anastasia Balo)   MVP (mitral valve prolapse)    Myalgia    OA (osteoarthritis) of hip 02/12/2016   Osteoporosis    Other nonrheumatic mitral valve disorders    Psoriasis    Pupil dilated    DUE TO EYE DROPS FOR TX OF RT  EYE PROBLEM   Reactive hypoglycemia    Sensitive skin    Shingles    Vertigo    Vestibular neuronitis    Vitamin D deficiency    Past Surgical History:  Procedure  Laterality Date   EYE SURGERY Right 02-05-15   Cataract   EYE SURGERY Left 02-21-15   Cataract   TONSILLECTOMY     TOTAL HIP ARTHROPLASTY Right 02/12/2016   Procedure: RIGHT TOTAL HIP ARTHROPLASTY ANTERIOR APPROACH;  Surgeon: Liliane Rei, MD;  Location: [facility];  Service: Orthopedics;  Laterality: Right;   Patient Active Problem List   Diagnosis Date Noted   S/P lumbar fusion 02/18/2024   Mitral valve regurgitation 08/14/2021   Coronary artery calcification 08/14/2021   Aortic atherosclerosis (HCC) 08/14/2021   Hyperlipidemia 08/14/2021   Carotid artery disease (HCC) 08/14/2021   OA (osteoarthritis) of hip 02/12/2016    PCP: Elner Hahn, MD  REFERRING PROVIDER: Joaquin Mulberry, MD  REFERRING DIAG: M54.16 (ICD-10-CM) - Radiculopathy, lumbar region  Rationale for Evaluation and Treatment: Rehabilitation  THERAPY DIAG:  Radiculopathy, lumbar region  Muscle weakness (generalized)  Unsteadiness on feet  Dizziness and giddiness  Other abnormalities of gait and mobility  ONSET DATE: Surgery Date 02/18/2024  SUBJECTIVE:  SUBJECTIVE STATEMENT: Patient reports the surgeon did not feel that her shoulder was related to her surgery.  States that she was told she may have bursitis.  States that she is now on a steroid taper dose.  PERTINENT HISTORY:  Depression; osteoporosis; vertigo; Meniere's disease; hx Rt total hip arthroplasty ; scoliosis   PAIN:  Are you having pain? Yes: NPRS scale: 0-1/ 10  Pain location: back and shoulder blades Pain description: throbbing; burning; sore Aggravating factors: continuous movement; reaching in cabinets; walking in grocery store  Relieving factors: laying down  PRECAUTIONS: Back lumbar fusion 3/7/225. Lifting < 10 lbs  RED FLAGS: None   WEIGHT BEARING  RESTRICTIONS: No  FALLS:  Has patient fallen in last 6 months? No  LIVING ENVIRONMENT: Lives with: lives alone Lives in: House/apartment Stairs: Yes: Internal: 1  steps; none Has following equipment at home: Single point cane  OCCUPATION: Retired  PLOF: Independent, Independent with basic ADLs, Independent with household mobility without device, and Independent with gait  PATIENT GOALS: Return to activities she does at her church; Return to [employer]; shop without issues;   NEXT MD VISIT: July 2025 with Dr Rochelle Chu  OBJECTIVE:  Note: Objective measures were completed at Evaluation unless otherwise noted.  DIAGNOSTIC FINDINGS:  12/29/2023 Lumbar CT without contrast  IMPRESSION: 1. Transitional lumbosacral anatomy. Designating normal lumbar segmentation results in hypoplastic ribs at L1. Correlation with radiographs is recommended prior to any operative intervention. 2. Levoconvex lumbar scoliosis with degenerative spondylolisthesis at L2-L3, L5-S1. Advanced endplate degeneration at the former, facet arthropathy at the latter. 3. No convincing spinal stenosis by CT. But moderate right lateral recess and severe right neural foraminal stenosis at L2-L3. Mild left lateral recess stenosis at L3-L4. 4.  Aortic Atherosclerosis (ICD10-I70.0).   PATIENT SURVEYS:  Eval:  Modified Oswestry 17/15 34%  05/03/2024:  Modified Oswestry Low Back Pain Disability Questionnaire: 17 / 50 = 34.0 %  COGNITION: Overall cognitive status: Within functional limits for tasks assessed     SENSATION: WFL   POSTURE: rounded shoulders  SKIN: Incision healing well; no warmth noted  LUMBAR ROM: NT due to post surgery   LOWER EXTREMITY ROM:   WFL   LOWER EXTREMITY MMT:  Grossly 4-/5   FUNCTIONAL TESTS:  Eval: 5 times sit to stand: 24.35 sec w. UE support Timed up and go (TUG): 27.10 sec with str cane. Pt experienced some dizziness after completing turn  03/21/2024: Modified  CTSIB:  Condition 1- 30 sec (but self corrected loss of balance at 20 sec), Condition 2- 5 sec, Condition 3- 30 sec (with increased sway), Condition 4- 2 sec.  Total- 67/120    03/23/2024 6 MWT with str cane:642ft- increased anterior Lt hip pain at 2 minute mark. Patient frequently touches walls when ambulating. She verbalized dizziness when people were walking around her.    04/12/2024: 5 times sit to stand: 11.77 sec with pain of 1/10 "it's more of a throb, not sharp pain." Timed up and go (TUG): 12.69 sec without assistive device (with slight balance "check" with 180 degree turn)  04/19/2024: 5 times sit to stand: 10.99 sec Timed up and go (TUG): 10.14 sec with some slight self corrected "balance checks" Modified CTSIB:  Condition 1- 30 sec (very slight balance adjustments), Condition 2- 17 sec, Condition 3- 30 sec (with increased sway), Condition 4- 5 sec.  Total- 82/120  GAIT: Distance walked: 59ft Assistive device utilized: Single point cane Level of assistance: Complete Independence Comments: decreased step length;  patient experiences mild dizziness when turning  VESTIBULAR ASSESSMENT: 03/21/2024: Smooth pursuit:  nystagmus noted when looking to the right and up.  No nystagmus looking left Saccades:  WNL Gaze stabilization is WNL Head Impulse Test:  positive bilaterally, worse on right than left [place] negative bilaterally   TREATMENT DATE:  05/03/2024: Nustep level 5 x6  min with PT present to discuss status Unilateral 5# KB hold + BOSU tap x 20 bilateral  Standing unilateral clock tap (12/3/6 o'clock) x10 bilat Forward T with barre support x10 bilat FWD monster walk with yellow tband in parallel bars down and back x2 laps Side stepping with yellow tband around ankles in parallel bars down and back x2 laps Seated scapular retraction 2x10 Standing rocker board for DF/PF x1 min   05/01/2024: Nustep level 5 x6  min with PT present to discuss status Unilateral 5# KB hold  + BOSU tap x 20 bilateral  Standing on flat side of bosu with UE support as needed 3x30 sec 5 cones of different colors (PT verbalizing color) pt tapping with single leg no UE support x 1 min Foot on purple/pink dynadisc step overs with one leg at a time x 10 Lateral lunge to purple/pink dynadisc x 10 Marching on airex with unilateral 5# DB hold x 10 waist height x 10 shoulder height Standing on airex 5# kettlebell around the worlds x 10 each direction Manual: Addaday to right hip for improved circulation and muscle length. PT present to monitor status    04/26/2024: Nustep level 5 x6  min with PT present to discuss status Unilateral 5# KB hold + BOSU tap x 20 bilateral  Standing on flat side of bosu with UE support as needed 3x30 sec Standing on airex 5# kettlebell around the worlds x 10 each direction Standing on airex + march holding 5# kettlebell (Farmer's carry) unilateral x 20 Tandem walking on AirEx beam in parallel bars with UE support, as needed.  Down and back x3 Side stepping on AirEx beam in parallel bars with UE support, as needed.  Down and back x3 Step down from 4" step x10 bilat Single leg stance on foam pod performing clock taps (12/3/6) 2x5 blat (with UE support of parallel bars, as needed) Alt toe taps to 2 cones on floor 2x5 with UE support of parallel bars, as needed Seated on dynadisc performing marching and LAQ.  2x5 bilat     PATIENT EDUCATION:  Education details: Spinal precautions; log roll; POC Person educated: Patient Education method: Explanation, Demonstration, and Handouts Education comprehension: verbalized understanding, returned demonstration, and needs further education  HOME EXERCISE PROGRAM: Access Code: [email or web address] URL: [email or web address] Date: 05/03/2024 Prepared by: Chaneta Comer Elster Corbello  Exercises - Supine Transversus Abdominis Bracing - Hands on Stomach  - 1 x daily - 7 x weekly - 2 sets - 10 reps - 4 sec hold - Supine  Diaphragmatic Breathing  - 1 x daily - 7 x weekly - 1 sets - 10 reps - Seated Long Arc Quad  - 1 x daily - 7 x weekly - 2 sets - 10 reps - 2 hold - Standing Heel Raise with Support  - 1 x daily - 7 x weekly - 2 sets - 10 reps - Seated Horizontal Smooth Pursuit  - 1 x daily - 7 x weekly - 2 sets - 10 reps - Seated Vertical Smooth Pursuit  - 1 x daily - 7 x weekly - 2 sets - 10 reps - Seated Proximal-Distal Smooth Pursuit  -  1 x daily - 7 x weekly - 2 sets - 10 reps - Seated Gaze Stabilization with Head Rotation  - 1 x daily - 7 x weekly - 2 sets - 30 sec hold - Seated Gaze Stabilization with Head Nod  - 1 x daily - 7 x weekly - 2 sets - 30 sec hold - Seated Scapular Retraction  - 1 x daily - 7 x weekly - 3 sets - 10 reps - Single Leg Balance with Clock Reach  - 1 x daily - 7 x weekly - 1-2 sets - 10 reps - Forward T with Counter Support  - 1 x daily - 7 x weekly - 1 sets - 10 reps - Forward Monster Walk with Resistance at Ankles and Counter Support  - 1 x daily - 7 x weekly - 2 sets - 10 reps - Side Stepping with Resistance at Ankles and Counter Support  - 1 x daily - 7 x weekly - 2 sets - 10 reps  ASSESSMENT:  CLINICAL IMPRESSION: Ms Covino presents to skilled PT with no new complaints.  Patient was provided with updated HEP and reviewed these exercises with patient during her visit.  Provided patient with updated HEP to assist with progression at home.  Patient with some difficulty with forward T when having to weight bear through right LE, so educated on not going too far.  Patient given a yellow theraband for home use to use with band walking exercises.  Patient continues to require skilled PT to progress towards goal related activities.     OBJECTIVE IMPAIRMENTS: Abnormal gait, decreased balance, decreased coordination, difficulty walking, decreased ROM, decreased strength, dizziness, increased muscle spasms, postural dysfunction, and pain.   ACTIVITY LIMITATIONS: carrying, lifting,  bending, sitting, standing, squatting, transfers, and bed mobility  PARTICIPATION LIMITATIONS: cleaning, laundry, shopping, community activity, yard work, and church  PERSONAL FACTORS: Age, Fitness, and 1-2 comorbidities: Depression; osteoporosis are also affecting patient's functional outcome.   REHAB POTENTIAL: Good  CLINICAL DECISION MAKING: Evolving/moderate complexity  EVALUATION COMPLEXITY: Moderate   GOALS: Goals reviewed with patient? Yes  SHORT TERM GOALS: Target date: 04/17/2024  Patient will be independent with initial HEP. Baseline:  Goal status: Met on 03/27/24  2.  Patient will report > or = to 40% improvement in functional activity since starting PT. Baseline:  Goal status: Met on 03/27/24  3.  Patient to undergo vestibular assessment by vestibular physical therapist. Baseline:  Goal status: Met on 03/21/2024  4.  Patient will be able to perform STS with no UE support due to improved LE strength. Baseline:  Goal status: Met on 03/27/24   LONG TERM GOALS: Target date: 05/15/2024 Patient will demonstrate independence in advanced HEP. Baseline:  Goal status: Ongoing  2.  Patient will demonstrate improved walking tolerance and be able to grocery shop with minimal limitations due to her back pain. Baseline:  Goal status: Ongoing  3.  Patient will verbalize and demonstrate self-care strategies to manage pain including tissue mobility practices and change of position. Baseline:  Goal status: INITIAL  3.  Patient will reports > or = 70% improvement in her dizziness since starting PT. Baseline:  Goal status: Met on 04/12/24 (with patient reporting has improved 75% to her baseline)  4.  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  Baseline:  Goal status: Met on 04/21/24  5.  Patient will perform tug in < or =  to 17 sec for decreased falls risk.  Baseline: 27.10sec Goal status: Partially Met (met for  time, but one slight bobble with 180 degree turn on 04/12/24)    PLAN:  PT FREQUENCY: 2x/week  PT DURATION: 8 weeks  PLANNED INTERVENTIONS: 97164- PT Re-evaluation, 97110-Therapeutic exercises, 97530- Therapeutic activity, 97112- Neuromuscular re-education, 97535- Self Care, 40981- Manual therapy, [phone]- Gait training, [phone]- Orthotic Fit/training, [phone]- Canalith repositioning, [id number]- Aquatic Therapy, [phone]- Electrical stimulation (unattended), 97016- Vasopneumatic device, [id number]- Ultrasound, [id number]- Ionotophoresis 4mg /ml Dexamethasone , Patient/Family education, Balance training, Stair training, Taping, Dry Needling, Joint mobilization, Joint manipulation, Vestibular training, Cryotherapy, and Moist heat.  PLAN FOR NEXT SESSION:  continue functional & core strengthening and balance    Robyne Christen, PT, DPT 05/03/24, 10:27 AM  [facility] [address] [place], [place] [address] Phone # [phone] Fax [phone]

## 2024-05-04 ENCOUNTER — Other Ambulatory Visit: Payer: Self-pay | Admitting: Family Medicine

## 2024-05-04 DIAGNOSIS — Z1231 Encounter for screening mammogram for malignant neoplasm of breast: Secondary | ICD-10-CM

## 2024-05-10 ENCOUNTER — Encounter: Payer: Self-pay | Admitting: Rehabilitative and Restorative Service Providers"

## 2024-05-10 ENCOUNTER — Ambulatory Visit: Admitting: Rehabilitative and Restorative Service Providers"

## 2024-05-10 DIAGNOSIS — M6281 Muscle weakness (generalized): Secondary | ICD-10-CM

## 2024-05-10 DIAGNOSIS — R2689 Other abnormalities of gait and mobility: Secondary | ICD-10-CM

## 2024-05-10 DIAGNOSIS — R2681 Unsteadiness on feet: Secondary | ICD-10-CM

## 2024-05-10 DIAGNOSIS — M5416 Radiculopathy, lumbar region: Secondary | ICD-10-CM | POA: Diagnosis not present

## 2024-05-10 DIAGNOSIS — R42 Dizziness and giddiness: Secondary | ICD-10-CM

## 2024-05-10 NOTE — Therapy (Signed)
 OUTPATIENT PHYSICAL THERAPY TREATMENT NOTE AND REASSESSMENT NOTE   Patient Name: Lori Frost MRN: [id number] DOB:October 11, 1946, 78 y.o., female Today's Date: 05/10/2024  Progress Note Reporting Period 03/20/2024 to 05/10/2024  See note below for Objective Data and Assessment of Progress/Goals.      END OF SESSION:  PT End of Session - 05/10/24 0932     Visit Number 16    Date for PT Re-Evaluation 07/07/24    Authorization Type HTA    Progress Note Due on Visit 26    PT Start Time 0928    PT Stop Time 1010    PT Time Calculation (min) 42 min    Activity Tolerance Patient tolerated treatment well    Behavior During Therapy WFL for tasks assessed/performed                 Past Medical History:  Diagnosis Date   Allergy    Non-Allergic Rhinitis   Balance problem due to vestibular dysfunction    Bloating    CAD (coronary artery disease)    Carotid artery occlusion    Cataract    Chordae tendineae rupture (HCC)    Cyst of ovary, right    Depression    Diverticulosis    DJD (degenerative joint disease)    Eczema    Family history of adverse reaction to anesthesia    SISTER "HEART STOPPED DURING [id number]"8469 UNKNOWN CAUSE -  SHE DIED 10 YRS LATER OF HEART ATTACK   GERD (gastroesophageal reflux disease)    ESOPHAGITIS PRESENCE NOT SPECIFIED   Granuloma annulare    History of hiatal hernia    History of recurrent UTI (urinary tract infection)    Hypercholesterolemia    Hyperlipidemia    Lower back pain    Meniere's disease    Migraine    Mitral valve regurgitation    MODERATE NORMAL, MILD AORTIC REGURGITATION, MILD TRICUSPID REGURGITATION, LVH, GRADE 1 DIASTOLIC DYSFUNCTION, ECHO 2018 SUGGESTS RUPTURED CHORDAE TENDINEAE ( DR. Anastasia Balo)   MVP (mitral valve prolapse)    Myalgia    OA (osteoarthritis) of hip 02/12/2016   Osteoporosis    Other nonrheumatic mitral valve disorders    Psoriasis    Pupil dilated    DUE TO EYE DROPS FOR TX OF RT  EYE PROBLEM    Reactive hypoglycemia    Sensitive skin    Shingles    Vertigo    Vestibular neuronitis    Vitamin D deficiency    Past Surgical History:  Procedure Laterality Date   EYE SURGERY Right 02-05-15   Cataract   EYE SURGERY Left 02-21-15   Cataract   TONSILLECTOMY     TOTAL HIP ARTHROPLASTY Right 02/12/2016   Procedure: RIGHT TOTAL HIP ARTHROPLASTY ANTERIOR APPROACH;  Surgeon: Liliane Rei, MD;  Location: [facility];  Service: Orthopedics;  Laterality: Right;   Patient Active Problem List   Diagnosis Date Noted   S/P lumbar fusion 02/18/2024   Mitral valve regurgitation 08/14/2021   Coronary artery calcification 08/14/2021   Aortic atherosclerosis (HCC) 08/14/2021   Hyperlipidemia 08/14/2021   Carotid artery disease (HCC) 08/14/2021   OA (osteoarthritis) of hip 02/12/2016    PCP: Elner Hahn, MD  REFERRING PROVIDER: Joaquin Mulberry, MD  REFERRING DIAG: M54.16 (ICD-10-CM) - Radiculopathy, lumbar region  Rationale for Evaluation and Treatment: Rehabilitation  THERAPY DIAG:  Radiculopathy, lumbar region - Plan: PT plan of care cert/re-cert  Muscle weakness (generalized) - Plan: PT plan of care cert/re-cert  Unsteadiness on feet - Plan:  PT plan of care cert/re-cert  Dizziness and giddiness - Plan: PT plan of care cert/re-cert  Other abnormalities of gait and mobility - Plan: PT plan of care cert/re-cert  ONSET DATE: Surgery Date 02/18/2024  SUBJECTIVE:                                                                                                                                                                                           SUBJECTIVE STATEMENT: Patient reports since she was cleared to drive, she is planning to go out of town for a few days.  Patient states that she is still using a cart to carry her band equipment and using a pillow behind her back to support her in sitting.  PERTINENT HISTORY:  Depression; osteoporosis; vertigo; Meniere's disease; hx Rt total hip  arthroplasty ; scoliosis   PAIN:  Are you having pain? Yes: NPRS scale: 1/ 10  Pain location: back and shoulder blades Pain description: throbbing; burning; sore Aggravating factors: continuous movement; reaching in cabinets; walking in grocery store  Relieving factors: laying down  PRECAUTIONS: Back lumbar fusion 3/7/225. Lifting < 10 lbs  RED FLAGS: None   WEIGHT BEARING RESTRICTIONS: No  FALLS:  Has patient fallen in last 6 months? No  LIVING ENVIRONMENT: Lives with: lives alone Lives in: House/apartment Stairs: Yes: Internal: 1  steps; none Has following equipment at home: Single point cane  OCCUPATION: Retired  PLOF: Independent, Independent with basic ADLs, Independent with household mobility without device, and Independent with gait  PATIENT GOALS: Return to activities she does at her church; Return to [employer]; shop without issues;   NEXT MD VISIT: July 2025 with Dr Rochelle Chu  OBJECTIVE:  Note: Objective measures were completed at Evaluation unless otherwise noted.  DIAGNOSTIC FINDINGS:  12/29/2023 Lumbar CT without contrast  IMPRESSION: 1. Transitional lumbosacral anatomy. Designating normal lumbar segmentation results in hypoplastic ribs at L1. Correlation with radiographs is recommended prior to any operative intervention. 2. Levoconvex lumbar scoliosis with degenerative spondylolisthesis at L2-L3, L5-S1. Advanced endplate degeneration at the former, facet arthropathy at the latter. 3. No convincing spinal stenosis by CT. But moderate right lateral recess and severe right neural foraminal stenosis at L2-L3. Mild left lateral recess stenosis at L3-L4. 4.  Aortic Atherosclerosis (ICD10-I70.0).   PATIENT SURVEYS:  Eval:  Modified Oswestry 17/15 34%  05/03/2024:  Modified Oswestry Low Back Pain Disability Questionnaire: 17 / 50 = 34.0 %  COGNITION: Overall cognitive status: Within functional limits for tasks  assessed     SENSATION: WFL   POSTURE: rounded shoulders  SKIN: Incision healing well; no warmth noted  LUMBAR ROM: NT  due to post surgery   LOWER EXTREMITY ROM:   WFL   LOWER EXTREMITY MMT:  Grossly 4-/5   FUNCTIONAL TESTS:  Eval: 5 times sit to stand: 24.35 sec w. UE support Timed up and go (TUG): 27.10 sec with str cane. Pt experienced some dizziness after completing turn  03/21/2024: Modified CTSIB:  Condition 1- 30 sec (but self corrected loss of balance at 20 sec), Condition 2- 5 sec, Condition 3- 30 sec (with increased sway), Condition 4- 2 sec.  Total- 67/120    03/23/2024 6 MWT with str cane:657ft- increased anterior Lt hip pain at 2 minute mark. Patient frequently touches walls when ambulating. She verbalized dizziness when people were walking around her.    04/12/2024: 5 times sit to stand: 11.77 sec with pain of 1/10 "it's more of a throb, not sharp pain." Timed up and go (TUG): 12.69 sec without assistive device (with slight balance "check" with 180 degree turn)  04/19/2024: 5 times sit to stand: 10.99 sec Timed up and go (TUG): 10.14 sec with some slight self corrected "balance checks" Modified CTSIB:  Condition 1- 30 sec (very slight balance adjustments), Condition 2- 17 sec, Condition 3- 30 sec (with increased sway), Condition 4- 5 sec.  Total- 82/120  05/10/2024: 6 minute walk test:  1005 ft with pain in bilateral hips pain 3/10 starting at 3.5 min mark.  Only had to touch wall once for balance. Modified CTSIB:  Condition 1- 30 sec, Condition 2- 15 sec, Condition 3- 30 sec (with increased sway), Condition 4-  20 sec.  Total- 95/120  GAIT: Distance walked: 3ft Assistive device utilized: Single point cane Level of assistance: Complete Independence Comments: decreased step length; patient experiences mild dizziness when turning  VESTIBULAR ASSESSMENT: 03/21/2024: Smooth pursuit:  nystagmus noted when looking to the right and up.  No nystagmus looking  left Saccades:  WNL Gaze stabilization is WNL Head Impulse Test:  positive bilaterally, worse on right than left [place] negative bilaterally   TREATMENT DATE:  05/10/2024: Nustep level 5 x6  min with PT present to discuss status Modified CTSIB 6 min walk test 1,005 ft Standing unilateral clock tap (12/3/6 o'clock) x10 bilat Standing rocker board for DF/PF x2 min Standing balance with rocker board in lateral position x2 min (with UE support of barre, as needed) Standing rows with red tband x10, then standing on foam pad 2x10 (with CGA from PT) Standing on foam pad performing shoulder extension with red tband 2x10 (with CGA from PT)   05/03/2024: Nustep level 5 x6  min with PT present to discuss status Unilateral 5# KB hold + BOSU tap x 20 bilateral  Standing unilateral clock tap (12/3/6 o'clock) x10 bilat Forward T with barre support x10 bilat FWD monster walk with yellow tband in parallel bars down and back x2 laps Side stepping with yellow tband around ankles in parallel bars down and back x2 laps Seated scapular retraction 2x10 Standing rocker board for DF/PF x1 min   05/01/2024: Nustep level 5 x6  min with PT present to discuss status Unilateral 5# KB hold + BOSU tap x 20 bilateral  Standing on flat side of bosu with UE support as needed 3x30 sec 5 cones of different colors (PT verbalizing color) pt tapping with single leg no UE support x 1 min Foot on purple/pink dynadisc step overs with one leg at a time x 10 Lateral lunge to purple/pink dynadisc x 10 Marching on airex with unilateral 5# DB hold x 10  waist height x 10 shoulder height Standing on airex 5# kettlebell around the worlds x 10 each direction Manual: Addaday to right hip for improved circulation and muscle length. PT present to monitor status     PATIENT EDUCATION:  Education details: Spinal precautions; log roll; POC Person educated: Patient Education method: Explanation, Demonstration, and  Handouts Education comprehension: verbalized understanding, returned demonstration, and needs further education  HOME EXERCISE PROGRAM: Access Code: [email or web address] URL: [email or web address] Date: 05/03/2024 Prepared by: Chaneta Comer Drevion Offord  Exercises - Supine Transversus Abdominis Bracing - Hands on Stomach  - 1 x daily - 7 x weekly - 2 sets - 10 reps - 4 sec hold - Supine Diaphragmatic Breathing  - 1 x daily - 7 x weekly - 1 sets - 10 reps - Seated Long Arc Quad  - 1 x daily - 7 x weekly - 2 sets - 10 reps - 2 hold - Standing Heel Raise with Support  - 1 x daily - 7 x weekly - 2 sets - 10 reps - Seated Horizontal Smooth Pursuit  - 1 x daily - 7 x weekly - 2 sets - 10 reps - Seated Vertical Smooth Pursuit  - 1 x daily - 7 x weekly - 2 sets - 10 reps - Seated Proximal-Distal Smooth Pursuit  - 1 x daily - 7 x weekly - 2 sets - 10 reps - Seated Gaze Stabilization with Head Rotation  - 1 x daily - 7 x weekly - 2 sets - 30 sec hold - Seated Gaze Stabilization with Head Nod  - 1 x daily - 7 x weekly - 2 sets - 30 sec hold - Seated Scapular Retraction  - 1 x daily - 7 x weekly - 3 sets - 10 reps - Single Leg Balance with Clock Reach  - 1 x daily - 7 x weekly - 1-2 sets - 10 reps - Forward T with Counter Support  - 1 x daily - 7 x weekly - 1 sets - 10 reps - Forward Monster Walk with Resistance at Ankles and Counter Support  - 1 x daily - 7 x weekly - 2 sets - 10 reps - Side Stepping with Resistance at Ankles and Counter Support  - 1 x daily - 7 x weekly - 2 sets - 10 reps  ASSESSMENT:  CLINICAL IMPRESSION: Ms Beightol presents to skilled PT with reports that her back pain is feeling better, but she does feel a catching in her hips of sometimes up to a 3-4/10.  Patient with similar score noted on modified Oswestry last session.  However, has improved in all functional assessments.  Patient with much improvement noted on 6 minute walk test.  Additionally, balance has improved and patient has  improved score noted on modified CTSIB.  Patient continues to have most difficulty with eyes closed.  Patient is going to drive for the first time next week out of town and will be able to assess further return to prior activities at that time.  Patient continues to be able to participate in choir and band, but does have modifications such as using a cart to move equipment sitting with a pillow behind her back.  Patient has not yet met all goals and would benefit from continued skilled PT of 1-2x/week for an additional 8 weeks to progress towards decreased pain with functional tasks.      OBJECTIVE IMPAIRMENTS: Abnormal gait, decreased balance, decreased coordination, difficulty walking, decreased ROM, decreased strength, dizziness, increased muscle spasms, postural dysfunction,  and pain.   ACTIVITY LIMITATIONS: carrying, lifting, bending, sitting, standing, squatting, transfers, and bed mobility  PARTICIPATION LIMITATIONS: cleaning, laundry, shopping, community activity, yard work, and church  PERSONAL FACTORS: Age, Fitness, and 1-2 comorbidities: Depression; osteoporosis are also affecting patient's functional outcome.   REHAB POTENTIAL: Good  CLINICAL DECISION MAKING: Evolving/moderate complexity  EVALUATION COMPLEXITY: Moderate   GOALS: Goals reviewed with patient? Yes  SHORT TERM GOALS: Target date: 04/17/2024  Patient will be independent with initial HEP. Baseline:  Goal status: Met on 03/27/24  2.  Patient will report > or = to 40% improvement in functional activity since starting PT. Baseline:  Goal status: Met on 03/27/24  3.  Patient to undergo vestibular assessment by vestibular physical therapist. Baseline:  Goal status: Met on 03/21/2024  4.  Patient will be able to perform STS with no UE support due to improved LE strength. Baseline:  Goal status: Met on 03/27/24   LONG TERM GOALS: Target date: 07/07/2024 Patient will demonstrate independence in advanced HEP to allow  for self progression after discharge. Baseline:  Goal status: Ongoing  2.  Patient will demonstrate improved walking tolerance and be able to grocery shop with minimal limitations due to her back pain. Baseline:  Goal status: Ongoing (see above for 6 min walk, still has pain starting at 3.5 min on 05/10/24)  3.  Patient will verbalize and demonstrate self-care strategies to manage pain including tissue mobility practices and change of position. Baseline:  Goal status: Ongoing  3.  Patient will reports > or = 70% improvement in her dizziness since starting PT. Baseline:  Goal status: Met on 04/12/24 (with patient reporting has improved 75% to her baseline)  4.  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  Baseline:  Goal status: Met on 04/21/24  5.  Patient will perform tug in < or = to 17 sec for decreased falls risk.  Baseline: 27.10sec Goal status: Partially Met (met for time, but one slight bobble with 180 degree turn on 04/12/24)    PLAN:  PT FREQUENCY: 1-2x/week  PT DURATION: 8 weeks  PLANNED INTERVENTIONS: [id number]- PT Re-evaluation, 97110-Therapeutic exercises, 97530- Therapeutic activity, 97112- Neuromuscular re-education, 97535- Self Care, 95621- Manual therapy, [phone]- Gait training, [phone]- Orthotic Fit/training, [phone]- Canalith repositioning, [id number]- Aquatic Therapy, [phone]- Electrical stimulation (unattended), 97016- Vasopneumatic device, [id number]- Ultrasound, [id number]- Ionotophoresis 4mg /ml Dexamethasone , Patient/Family education, Balance training, Stair training, Taping, Dry Needling, Joint mobilization, Joint manipulation, Vestibular training, Cryotherapy, and Moist heat.  PLAN FOR NEXT SESSION:  continue functional & core strengthening and balance    Robyne Christen, PT, DPT 05/10/24, 10:24 AM  [facility] [address], [address] [place], [place] [address] Phone # [phone] Fax  [phone]

## 2024-05-12 ENCOUNTER — Ambulatory Visit: Admitting: Physical Therapy

## 2024-05-15 ENCOUNTER — Ambulatory Visit: Attending: Neurological Surgery | Admitting: Physical Therapy

## 2024-05-15 ENCOUNTER — Encounter: Payer: Self-pay | Admitting: Physical Therapy

## 2024-05-15 DIAGNOSIS — M6281 Muscle weakness (generalized): Secondary | ICD-10-CM | POA: Diagnosis not present

## 2024-05-15 DIAGNOSIS — R2681 Unsteadiness on feet: Secondary | ICD-10-CM | POA: Insufficient documentation

## 2024-05-15 DIAGNOSIS — R2689 Other abnormalities of gait and mobility: Secondary | ICD-10-CM | POA: Insufficient documentation

## 2024-05-15 DIAGNOSIS — M5416 Radiculopathy, lumbar region: Secondary | ICD-10-CM | POA: Diagnosis not present

## 2024-05-15 DIAGNOSIS — R42 Dizziness and giddiness: Secondary | ICD-10-CM | POA: Diagnosis not present

## 2024-05-15 NOTE — Therapy (Signed)
 OUTPATIENT PHYSICAL THERAPY TREATMENT NOTE    Patient Name: Lori Frost MRN: [id number] DOB:11/14/1946, 78 y.o., female Today's Date: 05/15/2024   END OF SESSION:  PT End of Session - 05/15/24 1550     Visit Number 17    Date for PT Re-Evaluation 07/07/24    Authorization Type HTA    Progress Note Due on Visit 26    PT Start Time 1448    PT Stop Time 1542    PT Time Calculation (min) 54 min    Activity Tolerance Patient tolerated treatment well    Behavior During Therapy WFL for tasks assessed/performed                  Past Medical History:  Diagnosis Date   Allergy    Non-Allergic Rhinitis   Balance problem due to vestibular dysfunction    Bloating    CAD (coronary artery disease)    Carotid artery occlusion    Cataract    Chordae tendineae rupture (HCC)    Cyst of ovary, right    Depression    Diverticulosis    DJD (degenerative joint disease)    Eczema    Family history of adverse reaction to anesthesia    SISTER "HEART STOPPED DURING NFAOZHY"8657 UNKNOWN CAUSE -  SHE DIED 10 YRS LATER OF HEART ATTACK   GERD (gastroesophageal reflux disease)    ESOPHAGITIS PRESENCE NOT SPECIFIED   Granuloma annulare    History of hiatal hernia    History of recurrent UTI (urinary tract infection)    Hypercholesterolemia    Hyperlipidemia    Lower back pain    Meniere's disease    Migraine    Mitral valve regurgitation    MODERATE NORMAL, MILD AORTIC REGURGITATION, MILD TRICUSPID REGURGITATION, LVH, GRADE 1 DIASTOLIC DYSFUNCTION, ECHO 2018 SUGGESTS RUPTURED CHORDAE TENDINEAE ( DR. Anastasia Balo)   MVP (mitral valve prolapse)    Myalgia    OA (osteoarthritis) of hip 02/12/2016   Osteoporosis    Other nonrheumatic mitral valve disorders    Psoriasis    Pupil dilated    DUE TO EYE DROPS FOR TX OF RT  EYE PROBLEM   Reactive hypoglycemia    Sensitive skin    Shingles    Vertigo    Vestibular neuronitis    Vitamin D deficiency    Past Surgical History:  Procedure  Laterality Date   EYE SURGERY Right 02-05-15   Cataract   EYE SURGERY Left 02-21-15   Cataract   TONSILLECTOMY     TOTAL HIP ARTHROPLASTY Right 02/12/2016   Procedure: RIGHT TOTAL HIP ARTHROPLASTY ANTERIOR APPROACH;  Surgeon: Liliane Rei, MD;  Location: [facility];  Service: Orthopedics;  Laterality: Right;   Patient Active Problem List   Diagnosis Date Noted   S/P lumbar fusion 02/18/2024   Mitral valve regurgitation 08/14/2021   Coronary artery calcification 08/14/2021   Aortic atherosclerosis (HCC) 08/14/2021   Hyperlipidemia 08/14/2021   Carotid artery disease (HCC) 08/14/2021   OA (osteoarthritis) of hip 02/12/2016    PCP: Elner Hahn, MD  REFERRING PROVIDER: Joaquin Mulberry, MD  REFERRING DIAG: M54.16 (ICD-10-CM) - Radiculopathy, lumbar region  Rationale for Evaluation and Treatment: Rehabilitation  THERAPY DIAG:  Radiculopathy, lumbar region  Muscle weakness (generalized)  Unsteadiness on feet  Dizziness and giddiness  Other abnormalities of gait and mobility  ONSET DATE: Surgery Date 02/18/2024  SUBJECTIVE:  SUBJECTIVE STATEMENT: Patient reports she is having right shoulder blade pain. She is not currently having back pain.  PERTINENT HISTORY:  Depression; osteoporosis; vertigo; Meniere's disease; hx Rt total hip arthroplasty ; scoliosis   PAIN:  Are you having pain? Yes: NPRS scale: 1/ 10  Pain location: back and shoulder blades Pain description: throbbing; burning; sore Aggravating factors: continuous movement; reaching in cabinets; walking in grocery store  Relieving factors: laying down  PRECAUTIONS: Back lumbar fusion 3/7/225. Lifting < 10 lbs  RED FLAGS: None   WEIGHT BEARING RESTRICTIONS: No  FALLS:  Has patient fallen in last 6 months? No  LIVING  ENVIRONMENT: Lives with: lives alone Lives in: House/apartment Stairs: Yes: Internal: 1  steps; none Has following equipment at home: Single point cane  OCCUPATION: Retired  PLOF: Independent, Independent with basic ADLs, Independent with household mobility without device, and Independent with gait  PATIENT GOALS: Return to activities she does at her church; Return to [employer]; shop without issues;   NEXT MD VISIT: July 2025 with Dr Rochelle Chu  OBJECTIVE:  Note: Objective measures were completed at Evaluation unless otherwise noted.  DIAGNOSTIC FINDINGS:  12/29/2023 Lumbar CT without contrast  IMPRESSION: 1. Transitional lumbosacral anatomy. Designating normal lumbar segmentation results in hypoplastic ribs at L1. Correlation with radiographs is recommended prior to any operative intervention. 2. Levoconvex lumbar scoliosis with degenerative spondylolisthesis at L2-L3, L5-S1. Advanced endplate degeneration at the former, facet arthropathy at the latter. 3. No convincing spinal stenosis by CT. But moderate right lateral recess and severe right neural foraminal stenosis at L2-L3. Mild left lateral recess stenosis at L3-L4. 4.  Aortic Atherosclerosis (ICD10-I70.0).   PATIENT SURVEYS:  Eval:  Modified Oswestry 17/15 34%  05/03/2024:  Modified Oswestry Low Back Pain Disability Questionnaire: 17 / 50 = 34.0 %  COGNITION: Overall cognitive status: Within functional limits for tasks assessed     SENSATION: WFL   POSTURE: rounded shoulders  SKIN: Incision healing well; no warmth noted  LUMBAR ROM: NT due to post surgery   LOWER EXTREMITY ROM:   WFL   LOWER EXTREMITY MMT:  Grossly 4-/5   FUNCTIONAL TESTS:  Eval: 5 times sit to stand: 24.35 sec w. UE support Timed up and go (TUG): 27.10 sec with str cane. Pt experienced some dizziness after completing turn  03/21/2024: Modified CTSIB:  Condition 1- 30 sec (but self corrected loss of balance at 20 sec),  Condition 2- 5 sec, Condition 3- 30 sec (with increased sway), Condition 4- 2 sec.  Total- 67/120    03/23/2024 6 MWT with str cane:669ft- increased anterior Lt hip pain at 2 minute mark. Patient frequently touches walls when ambulating. She verbalized dizziness when people were walking around her.    04/12/2024: 5 times sit to stand: 11.77 sec with pain of 1/10 "it's more of a throb, not sharp pain." Timed up and go (TUG): 12.69 sec without assistive device (with slight balance "check" with 180 degree turn)  04/19/2024: 5 times sit to stand: 10.99 sec Timed up and go (TUG): 10.14 sec with some slight self corrected "balance checks" Modified CTSIB:  Condition 1- 30 sec (very slight balance adjustments), Condition 2- 17 sec, Condition 3- 30 sec (with increased sway), Condition 4- 5 sec.  Total- 82/120  05/10/2024: 6 minute walk test:  1005 ft with pain in bilateral hips pain 3/10 starting at 3.5 min mark.  Only had to touch wall once for balance. Modified CTSIB:  Condition 1- 30 sec, Condition 2- 15  sec, Condition 3- 30 sec (with increased sway), Condition 4-  20 sec.  Total- 95/120  GAIT: Distance walked: 9ft Assistive device utilized: Single point cane Level of assistance: Complete Independence Comments: decreased step length; patient experiences mild dizziness when turning  VESTIBULAR ASSESSMENT: 03/21/2024: Smooth pursuit:  nystagmus noted when looking to the right and up.  No nystagmus looking left Saccades:  WNL Gaze stabilization is WNL Head Impulse Test:  positive bilaterally, worse on right than left [place] negative bilaterally   TREATMENT DATE:  05/15/2024: Nustep level 3 x7  min with PT present to discuss status Marching on airex with 3# DB overhead x 10 each arm Standing rows with red tband x10, then standing on foam pad 2x10 (SBA from PT) Standing on foam pad performing shoulder extension with red tband 2x10 (SBA from PT) Tandem on airex beam (forwards & backwards) x  3 laps occasional UE support Standing rocker board for DF/PF x2 min' Unilateral farmer's carry 5# KB x 2 laps around cancer gym Seated stability ball rollout (forwards only) x 10  Seated on stability ball LAQ 2 x 10 bilateral (PT seated behind patient) Seated on stability ball alt arm and knee raise x 10 bilateral (PT seated behind patient) Seated on stability ball chest press with 3# DB 2 x 10 (PT seated behind patient) Seated thoracic extension with long pink power band 2 x 10 6 inch step taps holding one 8# DB x 20 each hand    05/10/2024: Nustep level 5 x6  min with PT present to discuss status Modified CTSIB 6 min walk test 1,005 ft Standing unilateral clock tap (12/3/6 o'clock) x10 bilat Standing rocker board for DF/PF x2 min Standing balance with rocker board in lateral position x2 min (with UE support of barre, as needed) Standing rows with red tband x10, then standing on foam pad 2x10 (with CGA from PT) Standing on foam pad performing shoulder extension with red tband 2x10 (with CGA from PT)   05/03/2024: Nustep level 5 x6  min with PT present to discuss status Unilateral 5# KB hold + BOSU tap x 20 bilateral  Standing unilateral clock tap (12/3/6 o'clock) x10 bilat Forward T with barre support x10 bilat FWD monster walk with yellow tband in parallel bars down and back x2 laps Side stepping with yellow tband around ankles in parallel bars down and back x2 laps Seated scapular retraction 2x10 Standing rocker board for DF/PF x1 min   05/01/2024: Nustep level 5 x6  min with PT present to discuss status Unilateral 5# KB hold + BOSU tap x 20 bilateral  Standing on flat side of bosu with UE support as needed 3x30 sec 5 cones of different colors (PT verbalizing color) pt tapping with single leg no UE support x 1 min Foot on purple/pink dynadisc step overs with one leg at a time x 10 Lateral lunge to purple/pink dynadisc x 10 Marching on airex with unilateral 5# DB hold x 10  waist height x 10 shoulder height Standing on airex 5# kettlebell around the worlds x 10 each direction Manual: [place] to right hip for improved circulation and muscle length. PT present to monitor status     PATIENT EDUCATION:  Education details: Spinal precautions; log roll; POC Person educated: Patient Education method: Explanation, Demonstration, and Handouts Education comprehension: verbalized understanding, returned demonstration, and needs further education  HOME EXERCISE PROGRAM: Access Code: [email or web address] URL: [email or web address] Date: 05/03/2024 Prepared by: Robyne Christen  Exercises - Supine Transversus Abdominis Bracing -  Hands on Stomach  - 1 x daily - 7 x weekly - 2 sets - 10 reps - 4 sec hold - Supine Diaphragmatic Breathing  - 1 x daily - 7 x weekly - 1 sets - 10 reps - Seated Long Arc Quad  - 1 x daily - 7 x weekly - 2 sets - 10 reps - 2 hold - Standing Heel Raise with Support  - 1 x daily - 7 x weekly - 2 sets - 10 reps - Seated Horizontal Smooth Pursuit  - 1 x daily - 7 x weekly - 2 sets - 10 reps - Seated Vertical Smooth Pursuit  - 1 x daily - 7 x weekly - 2 sets - 10 reps - Seated Proximal-Distal Smooth Pursuit  - 1 x daily - 7 x weekly - 2 sets - 10 reps - Seated Gaze Stabilization with Head Rotation  - 1 x daily - 7 x weekly - 2 sets - 30 sec hold - Seated Gaze Stabilization with Head Nod  - 1 x daily - 7 x weekly - 2 sets - 30 sec hold - Seated Scapular Retraction  - 1 x daily - 7 x weekly - 3 sets - 10 reps - Single Leg Balance with Clock Reach  - 1 x daily - 7 x weekly - 1-2 sets - 10 reps - Forward T with Counter Support  - 1 x daily - 7 x weekly - 1 sets - 10 reps - Forward Monster Walk with Resistance at Ankles and Counter Support  - 1 x daily - 7 x weekly - 2 sets - 10 reps - Side Stepping with Resistance at Ankles and Counter Support  - 1 x daily - 7 x weekly - 2 sets - 10 reps  ASSESSMENT:  CLINICAL IMPRESSION: Ms Ashbaugh presents to  skilled PT with no back pain but increased right shoulder blade pain. Incorporated core strengthening on stability ball and patient required frequent verbal cues for core engagement. Patient verbalized she still has challenges lifting objects and finding a good seated posture during back rehearsal. Provided options of seated positions for patient and educated her to continue to use her support pillow. Overall, she tolerated treatment session well. Patient will benefit from skilled PT to address the below impairments and improve overall function.    OBJECTIVE IMPAIRMENTS: Abnormal gait, decreased balance, decreased coordination, difficulty walking, decreased ROM, decreased strength, dizziness, increased muscle spasms, postural dysfunction, and pain.   ACTIVITY LIMITATIONS: carrying, lifting, bending, sitting, standing, squatting, transfers, and bed mobility  PARTICIPATION LIMITATIONS: cleaning, laundry, shopping, community activity, yard work, and church  PERSONAL FACTORS: Age, Fitness, and 1-2 comorbidities: Depression; osteoporosis are also affecting patient's functional outcome.   REHAB POTENTIAL: Good  CLINICAL DECISION MAKING: Evolving/moderate complexity  EVALUATION COMPLEXITY: Moderate   GOALS: Goals reviewed with patient? Yes  SHORT TERM GOALS: Target date: 04/17/2024  Patient will be independent with initial HEP. Baseline:  Goal status: Met on 03/27/24  2.  Patient will report > or = to 40% improvement in functional activity since starting PT. Baseline:  Goal status: Met on 03/27/24  3.  Patient to undergo vestibular assessment by vestibular physical therapist. Baseline:  Goal status: Met on 03/21/2024  4.  Patient will be able to perform STS with no UE support due to improved LE strength. Baseline:  Goal status: Met on 03/27/24   LONG TERM GOALS: Target date: 07/07/2024 Patient will demonstrate independence in advanced HEP to allow for self progression after  discharge. Baseline:  Goal status: Ongoing  2.  Patient will demonstrate improved walking tolerance and be able to grocery shop with minimal limitations due to her back pain. Baseline:  Goal status: Ongoing (see above for 6 min walk, still has pain starting at 3.5 min on 05/10/24)  3.  Patient will verbalize and demonstrate self-care strategies to manage pain including tissue mobility practices and change of position. Baseline:  Goal status: Ongoing  3.  Patient will reports > or = 70% improvement in her dizziness since starting PT. Baseline:  Goal status: Met on 04/12/24 (with patient reporting has improved 75% to her baseline)  4.  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  Baseline:  Goal status: Met on 04/21/24  5.  Patient will perform tug in < or = to 17 sec for decreased falls risk.  Baseline: 27.10sec Goal status: Partially Met (met for time, but one slight bobble with 180 degree turn on 04/12/24)    PLAN:  PT FREQUENCY: 1-2x/week  PT DURATION: 8 weeks  PLANNED INTERVENTIONS: 97164- PT Re-evaluation, 97110-Therapeutic exercises, 97530- Therapeutic activity, 97112- Neuromuscular re-education, 97535- Self Care, 16109- Manual therapy, [phone]- Gait training, [phone]- Orthotic Fit/training, [phone]- Canalith repositioning, [id number]- Aquatic Therapy, [phone]- Electrical stimulation (unattended), 97016- Vasopneumatic device, [id number]- Ultrasound, [id number]- Ionotophoresis 4mg /ml Dexamethasone , Patient/Family education, Balance training, Stair training, Taping, Dry Needling, Joint mobilization, Joint manipulation, Vestibular training, Cryotherapy, and Moist heat.  PLAN FOR NEXT SESSION:  hinging technique for lifting; erector spinae strengthening; continue functional & core strengthening and balance    Penelope Bowie, PT 05/15/24 3:51 PM   [facility] [address], [address] [place], [place]  [address] Phone # [phone] Fax [phone]

## 2024-05-17 ENCOUNTER — Ambulatory Visit: Admitting: Rehabilitative and Restorative Service Providers"

## 2024-05-23 ENCOUNTER — Ambulatory Visit: Admitting: Rehabilitative and Restorative Service Providers"

## 2024-05-31 ENCOUNTER — Encounter: Payer: Self-pay | Admitting: Physical Therapy

## 2024-05-31 ENCOUNTER — Ambulatory Visit: Admitting: Physical Therapy

## 2024-05-31 DIAGNOSIS — R2689 Other abnormalities of gait and mobility: Secondary | ICD-10-CM

## 2024-05-31 DIAGNOSIS — M6281 Muscle weakness (generalized): Secondary | ICD-10-CM

## 2024-05-31 DIAGNOSIS — R42 Dizziness and giddiness: Secondary | ICD-10-CM

## 2024-05-31 DIAGNOSIS — M5416 Radiculopathy, lumbar region: Secondary | ICD-10-CM

## 2024-05-31 DIAGNOSIS — R2681 Unsteadiness on feet: Secondary | ICD-10-CM

## 2024-05-31 NOTE — Therapy (Signed)
 OUTPATIENT PHYSICAL THERAPY TREATMENT NOTE    Patient Name: Lori Frost MRN: [id number] DOB:August 04, 1946, 78 y.o., female Today's Date: 05/31/2024   END OF SESSION:  PT End of Session - 05/31/24 1244     Visit Number 18    Date for PT Re-Evaluation 07/07/24    Authorization Type HTA    Progress Note Due on Visit 26    PT Start Time 1016    PT Stop Time 1057    PT Time Calculation (min) 41 min    Activity Tolerance Patient tolerated treatment well    Behavior During Therapy WFL for tasks assessed/performed                Past Medical History:  Diagnosis Date   Allergy    Non-Allergic Rhinitis   Balance problem due to vestibular dysfunction    Bloating    CAD (coronary artery disease)    Carotid artery occlusion    Cataract    Chordae tendineae rupture (HCC)    Cyst of ovary, right    Depression    Diverticulosis    DJD (degenerative joint disease)    Eczema    Family history of adverse reaction to anesthesia    SISTER HEART STOPPED DURING [id number] UNKNOWN CAUSE -  SHE DIED 10 YRS LATER OF HEART ATTACK   GERD (gastroesophageal reflux disease)    ESOPHAGITIS PRESENCE NOT SPECIFIED   Granuloma annulare    History of hiatal hernia    History of recurrent UTI (urinary tract infection)    Hypercholesterolemia    Hyperlipidemia    Lower back pain    Meniere's disease    Migraine    Mitral valve regurgitation    MODERATE NORMAL, MILD AORTIC REGURGITATION, MILD TRICUSPID REGURGITATION, LVH, GRADE 1 DIASTOLIC DYSFUNCTION, ECHO 2018 SUGGESTS RUPTURED CHORDAE TENDINEAE ( DR. Anastasia Balo)   MVP (mitral valve prolapse)    Myalgia    OA (osteoarthritis) of hip 02/12/2016   Osteoporosis    Other nonrheumatic mitral valve disorders    Psoriasis    Pupil dilated    DUE TO EYE DROPS FOR TX OF RT  EYE PROBLEM   Reactive hypoglycemia    Sensitive skin    Shingles    Vertigo    Vestibular neuronitis    Vitamin D deficiency    Past Surgical History:  Procedure  Laterality Date   EYE SURGERY Right 02-05-15   Cataract   EYE SURGERY Left 02-21-15   Cataract   TONSILLECTOMY     TOTAL HIP ARTHROPLASTY Right 02/12/2016   Procedure: RIGHT TOTAL HIP ARTHROPLASTY ANTERIOR APPROACH;  Surgeon: Liliane Rei, MD;  Location: [facility];  Service: Orthopedics;  Laterality: Right;   Patient Active Problem List   Diagnosis Date Noted   S/P lumbar fusion 02/18/2024   Mitral valve regurgitation 08/14/2021   Coronary artery calcification 08/14/2021   Aortic atherosclerosis (HCC) 08/14/2021   Hyperlipidemia 08/14/2021   Carotid artery disease (HCC) 08/14/2021   OA (osteoarthritis) of hip 02/12/2016    PCP: Elner Hahn, MD  REFERRING PROVIDER: Joaquin Mulberry, MD  REFERRING DIAG: M54.16 (ICD-10-CM) - Radiculopathy, lumbar region  Rationale for Evaluation and Treatment: Rehabilitation  THERAPY DIAG:  Radiculopathy, lumbar region  Muscle weakness (generalized)  Unsteadiness on feet  Dizziness and giddiness  Other abnormalities of gait and mobility  ONSET DATE: Surgery Date 02/18/2024  SUBJECTIVE:  SUBJECTIVE STATEMENT: Patient reports her dizziness has not been good recently. When she came back from her trip it got worse. This happened 6/10. She had a vertigo migraine. And that happened twice within a few days. She currently does not have a migraine just increased tension around her head.  PERTINENT HISTORY:  Depression; osteoporosis; vertigo; Meniere's disease; hx Rt total hip arthroplasty ; scoliosis   PAIN:  Are you having pain? Yes: NPRS scale: 1/ 10  Pain location: back and shoulder blades Pain description: throbbing; burning; sore Aggravating factors: continuous movement; reaching in cabinets; walking in grocery store  Relieving factors: laying  down  PRECAUTIONS: Back lumbar fusion 3/7/225. Lifting < 10 lbs  RED FLAGS: None   WEIGHT BEARING RESTRICTIONS: No  FALLS:  Has patient fallen in last 6 months? No  LIVING ENVIRONMENT: Lives with: lives alone Lives in: House/apartment Stairs: Yes: Internal: 1  steps; none Has following equipment at home: Single point cane  OCCUPATION: Retired  PLOF: Independent, Independent with basic ADLs, Independent with household mobility without device, and Independent with gait  PATIENT GOALS: Return to activities she does at her church; Return to [employer]; shop without issues;   NEXT MD VISIT: July 2025 with Dr Rochelle Chu  OBJECTIVE:  Note: Objective measures were completed at Evaluation unless otherwise noted.  DIAGNOSTIC FINDINGS:  12/29/2023 Lumbar CT without contrast  IMPRESSION: 1. Transitional lumbosacral anatomy. Designating normal lumbar segmentation results in hypoplastic ribs at L1. Correlation with radiographs is recommended prior to any operative intervention. 2. Levoconvex lumbar scoliosis with degenerative spondylolisthesis at L2-L3, L5-S1. Advanced endplate degeneration at the former, facet arthropathy at the latter. 3. No convincing spinal stenosis by CT. But moderate right lateral recess and severe right neural foraminal stenosis at L2-L3. Mild left lateral recess stenosis at L3-L4. 4.  Aortic Atherosclerosis (ICD10-I70.0).   PATIENT SURVEYS:  Eval:  Modified Oswestry 17/15 34%  05/03/2024:  Modified Oswestry Low Back Pain Disability Questionnaire: 17 / 50 = 34.0 %  COGNITION: Overall cognitive status: Within functional limits for tasks assessed     SENSATION: WFL   POSTURE: rounded shoulders  SKIN: Incision healing well; no warmth noted  LUMBAR ROM: NT due to post surgery   LOWER EXTREMITY ROM:   WFL   LOWER EXTREMITY MMT:  Grossly 4-/5   FUNCTIONAL TESTS:  Eval: 5 times sit to stand: 24.35 sec w. UE support Timed up and go  (TUG): 27.10 sec with str cane. Pt experienced some dizziness after completing turn  03/21/2024: Modified CTSIB:  Condition 1- 30 sec (but self corrected loss of balance at 20 sec), Condition 2- 5 sec, Condition 3- 30 sec (with increased sway), Condition 4- 2 sec.  Total- 67/120    03/23/2024 6 MWT with str cane:687ft- increased anterior Lt hip pain at 2 minute mark. Patient frequently touches walls when ambulating. She verbalized dizziness when people were walking around her.    04/12/2024: 5 times sit to stand: 11.77 sec with pain of 1/10 it's more of a throb, not sharp pain. Timed up and go (TUG): 12.69 sec without assistive device (with slight balance check with 180 degree turn)  04/19/2024: 5 times sit to stand: 10.99 sec Timed up and go (TUG): 10.14 sec with some slight self corrected balance checks Modified CTSIB:  Condition 1- 30 sec (very slight balance adjustments), Condition 2- 17 sec, Condition 3- 30 sec (with increased sway), Condition 4- 5 sec.  Total- 82/120  05/10/2024: 6 minute walk test:  1005  ft with pain in bilateral hips pain 3/10 starting at 3.5 min mark.  Only had to touch wall once for balance. Modified CTSIB:  Condition 1- 30 sec, Condition 2- 15 sec, Condition 3- 30 sec (with increased sway), Condition 4-  20 sec.  Total- 95/120  GAIT: Distance walked: 85ft Assistive device utilized: Single point cane Level of assistance: Complete Independence Comments: decreased step length; patient experiences mild dizziness when turning  VESTIBULAR ASSESSMENT: 03/21/2024: Smooth pursuit:  nystagmus noted when looking to the right and up.  No nystagmus looking left Saccades:  WNL Gaze stabilization is WNL Head Impulse Test:  positive bilaterally, worse on right than left [place] negative bilaterally   TREATMENT DATE:  05/31/2024: Nustep level 5 x7  min with PT present to discuss status Side stepping in // bars with green TB x 3 laps Lateral & backwards step with  green TB around ankles x 10 each direction 6 inch step taps holding 5# KB x 20 each (had to stop due to feeling woozy) Seated ball press with purple ball x 20 Seated alt knee and hand press with purple ball x 10 each side Seated scaption 2# 2 x 10 Seated on pink dynadisk LAQ x 10 bilateral  Seated on pink dynadisk chest press 5# KB x 10 Seated on pink dynadisk ear to hip 5# KB x 10 each direction Seated on pink dynadisk shoulder row, abduction, ER, extension  with red TB  x 10 each direction Tandem walking in // bars x 2 laps no UE support      05/15/2024: Nustep level 3 x7  min with PT present to discuss status Marching on airex with 3# DB overhead x 10 each arm Standing rows with red tband x10, then standing on foam pad 2x10 (SBA from PT) Standing on foam pad performing shoulder extension with red tband 2x10 (SBA from PT) Tandem on airex beam (forwards & backwards) x 3 laps occasional UE support Standing rocker board for DF/PF x2 min' Unilateral farmer's carry 5# KB x 2 laps around cancer gym Seated stability ball rollout (forwards only) x 10  Seated on stability ball LAQ 2 x 10 bilateral (PT seated behind patient) Seated on stability ball alt arm and knee raise x 10 bilateral (PT seated behind patient) Seated on stability ball chest press with 3# DB 2 x 10 (PT seated behind patient) Seated thoracic extension with long pink power band 2 x 10 6 inch step taps holding one 8# DB x 20 each hand    05/10/2024: Nustep level 5 x6  min with PT present to discuss status Modified CTSIB 6 min walk test 1,005 ft Standing unilateral clock tap (12/3/6 o'clock) x10 bilat Standing rocker board for DF/PF x2 min Standing balance with rocker board in lateral position x2 min (with UE support of barre, as needed) Standing rows with red tband x10, then standing on foam pad 2x10 (with CGA from PT) Standing on foam pad performing shoulder extension with red tband 2x10 (with CGA from  PT)      PATIENT EDUCATION:  Education details: Spinal precautions; log roll; POC Person educated: Patient Education method: Explanation, Demonstration, and Handouts Education comprehension: verbalized understanding, returned demonstration, and needs further education  HOME EXERCISE PROGRAM: Access Code: [email or web address] URL: [email or web address] Date: 05/03/2024 Prepared by: Chaneta Comer Menke  Exercises - Supine Transversus Abdominis Bracing - Hands on Stomach  - 1 x daily - 7 x weekly - 2 sets - 10 reps - 4 sec hold - Supine  Diaphragmatic Breathing  - 1 x daily - 7 x weekly - 1 sets - 10 reps - Seated Long Arc Quad  - 1 x daily - 7 x weekly - 2 sets - 10 reps - 2 hold - Standing Heel Raise with Support  - 1 x daily - 7 x weekly - 2 sets - 10 reps - Seated Horizontal Smooth Pursuit  - 1 x daily - 7 x weekly - 2 sets - 10 reps - Seated Vertical Smooth Pursuit  - 1 x daily - 7 x weekly - 2 sets - 10 reps - Seated Proximal-Distal Smooth Pursuit  - 1 x daily - 7 x weekly - 2 sets - 10 reps - Seated Gaze Stabilization with Head Rotation  - 1 x daily - 7 x weekly - 2 sets - 30 sec hold - Seated Gaze Stabilization with Head Nod  - 1 x daily - 7 x weekly - 2 sets - 30 sec hold - Seated Scapular Retraction  - 1 x daily - 7 x weekly - 3 sets - 10 reps - Single Leg Balance with Clock Reach  - 1 x daily - 7 x weekly - 1-2 sets - 10 reps - Forward T with Counter Support  - 1 x daily - 7 x weekly - 1 sets - 10 reps - Forward Monster Walk with Resistance at Ankles and Counter Support  - 1 x daily - 7 x weekly - 2 sets - 10 reps - Side Stepping with Resistance at Ankles and Counter Support  - 1 x daily - 7 x weekly - 2 sets - 10 reps  ASSESSMENT:  CLINICAL IMPRESSION: Ms Rumberger presents to therapy with a two week lapse in treatment due to traveling. She has been having increased dizziness and she had two episodes of a vertigo migraine. During treatment session patient verbalized feeling  dizzy/ woozy. Opted to perform seated exercises on an unstable surface. These exercises were a good challenge for her. Patient also verbalized brain fog since having her vertigo migraines. After a seated rest break she was able to perform standing exercises. People walking past patient caused increased instability and required more upper extremity support compared to normal. Patient will benefit from skilled PT to address the below impairments and improve overall function.     OBJECTIVE IMPAIRMENTS: Abnormal gait, decreased balance, decreased coordination, difficulty walking, decreased ROM, decreased strength, dizziness, increased muscle spasms, postural dysfunction, and pain.   ACTIVITY LIMITATIONS: carrying, lifting, bending, sitting, standing, squatting, transfers, and bed mobility  PARTICIPATION LIMITATIONS: cleaning, laundry, shopping, community activity, yard work, and church  PERSONAL FACTORS: Age, Fitness, and 1-2 comorbidities: Depression; osteoporosis are also affecting patient's functional outcome.   REHAB POTENTIAL: Good  CLINICAL DECISION MAKING: Evolving/moderate complexity  EVALUATION COMPLEXITY: Moderate   GOALS: Goals reviewed with patient? Yes  SHORT TERM GOALS: Target date: 04/17/2024  Patient will be independent with initial HEP. Baseline:  Goal status: Met on 03/27/24  2.  Patient will report > or = to 40% improvement in functional activity since starting PT. Baseline:  Goal status: Met on 03/27/24  3.  Patient to undergo vestibular assessment by vestibular physical therapist. Baseline:  Goal status: Met on 03/21/2024  4.  Patient will be able to perform STS with no UE support due to improved LE strength. Baseline:  Goal status: Met on 03/27/24   LONG TERM GOALS: Target date: 07/07/2024 Patient will demonstrate independence in advanced HEP to allow for self progression after discharge. Baseline:  Goal status: Ongoing  2.  Patient will demonstrate improved  walking tolerance and be able to grocery shop with minimal limitations due to her back pain. Baseline:  Goal status: Ongoing (see above for 6 min walk, still has pain starting at 3.5 min on 05/10/24)  3.  Patient will verbalize and demonstrate self-care strategies to manage pain including tissue mobility practices and change of position. Baseline:  Goal status: Ongoing  3.  Patient will reports > or = 70% improvement in her dizziness since starting PT. Baseline:  Goal status: Met on 04/12/24 (with patient reporting has improved 75% to her baseline)  4.  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  Baseline:  Goal status: Met on 04/21/24  5.  Patient will perform tug in < or = to 17 sec for decreased falls risk.  Baseline: 27.10sec Goal status: Partially Met (met for time, but one slight bobble with 180 degree turn on 04/12/24)    PLAN:  PT FREQUENCY: 1-2x/week  PT DURATION: 8 weeks  PLANNED INTERVENTIONS: 97164- PT Re-evaluation, 97110-Therapeutic exercises, 97530- Therapeutic activity, 97112- Neuromuscular re-education, 97535- Self Care, 64403- Manual therapy, [phone]- Gait training, [phone]- Orthotic Fit/training, [phone]- Canalith repositioning, [id number]- Aquatic Therapy, [phone]- Electrical stimulation (unattended), 97016- Vasopneumatic device, [id number]- Ultrasound, [id number]- Ionotophoresis 4mg /ml Dexamethasone , Patient/Family education, Balance training, Stair training, Taping, Dry Needling, Joint mobilization, Joint manipulation, Vestibular training, Cryotherapy, and Moist heat.  PLAN FOR NEXT SESSION:  assess balance & vertigo migraine; hinging technique for lifting; erector spinae strengthening; continue functional & core strengthening and balance    Penelope Bowie, PT 05/31/24 12:48 PM   [facility] [address], [address] [place], [place] [address] Phone # [phone] Fax [phone]

## 2024-06-05 ENCOUNTER — Encounter: Payer: Self-pay | Admitting: Rehabilitative and Restorative Service Providers"

## 2024-06-05 ENCOUNTER — Ambulatory Visit: Admitting: Rehabilitative and Restorative Service Providers"

## 2024-06-05 DIAGNOSIS — M5416 Radiculopathy, lumbar region: Secondary | ICD-10-CM

## 2024-06-05 DIAGNOSIS — R2681 Unsteadiness on feet: Secondary | ICD-10-CM

## 2024-06-05 DIAGNOSIS — R42 Dizziness and giddiness: Secondary | ICD-10-CM

## 2024-06-05 DIAGNOSIS — R2689 Other abnormalities of gait and mobility: Secondary | ICD-10-CM

## 2024-06-05 DIAGNOSIS — M6281 Muscle weakness (generalized): Secondary | ICD-10-CM

## 2024-06-05 NOTE — Therapy (Signed)
 OUTPATIENT PHYSICAL THERAPY TREATMENT NOTE    Patient Name: Lori Frost MRN: [id number] DOB:1946/08/11, 78 y.o., female Today's Date: 06/05/2024   END OF SESSION:  PT End of Session - 06/05/24 0933     Visit Number 19    Date for PT Re-Evaluation 07/07/24    Authorization Type HTA    Progress Note Due on Visit 26    PT Start Time 0930    PT Stop Time 1010    PT Time Calculation (min) 40 min    Activity Tolerance Patient tolerated treatment well    Behavior During Therapy WFL for tasks assessed/performed                Past Medical History:  Diagnosis Date   Allergy    Non-Allergic Rhinitis   Balance problem due to vestibular dysfunction    Bloating    CAD (coronary artery disease)    Carotid artery occlusion    Cataract    Chordae tendineae rupture (HCC)    Cyst of ovary, right    Depression    Diverticulosis    DJD (degenerative joint disease)    Eczema    Family history of adverse reaction to anesthesia    SISTER HEART STOPPED DURING [id number] UNKNOWN CAUSE -  SHE DIED 10 YRS LATER OF HEART ATTACK   GERD (gastroesophageal reflux disease)    ESOPHAGITIS PRESENCE NOT SPECIFIED   Granuloma annulare    History of hiatal hernia    History of recurrent UTI (urinary tract infection)    Hypercholesterolemia    Hyperlipidemia    Lower back pain    Meniere's disease    Migraine    Mitral valve regurgitation    MODERATE NORMAL, MILD AORTIC REGURGITATION, MILD TRICUSPID REGURGITATION, LVH, GRADE 1 DIASTOLIC DYSFUNCTION, ECHO 2018 SUGGESTS RUPTURED CHORDAE TENDINEAE ( DR. BLANCA)   MVP (mitral valve prolapse)    Myalgia    OA (osteoarthritis) of hip 02/12/2016   Osteoporosis    Other nonrheumatic mitral valve disorders    Psoriasis    Pupil dilated    DUE TO EYE DROPS FOR TX OF RT  EYE PROBLEM   Reactive hypoglycemia    Sensitive skin    Shingles    Vertigo    Vestibular neuronitis    Vitamin D deficiency    Past Surgical History:  Procedure  Laterality Date   EYE SURGERY Right 02-05-15   Cataract   EYE SURGERY Left 02-21-15   Cataract   TONSILLECTOMY     TOTAL HIP ARTHROPLASTY Right 02/12/2016   Procedure: RIGHT TOTAL HIP ARTHROPLASTY ANTERIOR APPROACH;  Surgeon: Dempsey Moan, MD;  Location: [facility];  Service: Orthopedics;  Laterality: Right;   Patient Active Problem List   Diagnosis Date Noted   S/P lumbar fusion 02/18/2024   Mitral valve regurgitation 08/14/2021   Coronary artery calcification 08/14/2021   Aortic atherosclerosis (HCC) 08/14/2021   Hyperlipidemia 08/14/2021   Carotid artery disease (HCC) 08/14/2021   OA (osteoarthritis) of hip 02/12/2016    PCP: Cleotilde Been, MD  REFERRING PROVIDER: Joshua Alm Hamilton, MD  REFERRING DIAG: M54.16 (ICD-10-CM) - Radiculopathy, lumbar region  Rationale for Evaluation and Treatment: Rehabilitation  THERAPY DIAG:  Radiculopathy, lumbar region  Muscle weakness (generalized)  Unsteadiness on feet  Dizziness and giddiness  Other abnormalities of gait and mobility  ONSET DATE: Surgery Date 02/18/2024  SUBJECTIVE:  SUBJECTIVE STATEMENT: Patient states that her migraines have improved, she is primarily just having scalp numbness.  Patient states that her dizziness/imbalance has not returned to her baseline after her recent flair.  PERTINENT HISTORY:  Depression; osteoporosis; vertigo; Meniere's disease; hx Rt total hip arthroplasty ; scoliosis   PAIN:  Are you having pain? Yes: NPRS scale: 5-6/ 10  Pain location: right hip/leg Pain description: throbbing; burning; sore Aggravating factors: continuous movement; reaching in cabinets; walking in grocery store  Relieving factors: laying down  PRECAUTIONS: Back lumbar fusion 3/7/225. Lifting < 10 lbs  RED FLAGS: None   WEIGHT BEARING  RESTRICTIONS: No  FALLS:  Has patient fallen in last 6 months? No  LIVING ENVIRONMENT: Lives with: lives alone Lives in: House/apartment Stairs: Yes: Internal: 1  steps; none Has following equipment at home: Single point cane  OCCUPATION: Retired  PLOF: Independent, Independent with basic ADLs, Independent with household mobility without device, and Independent with gait  PATIENT GOALS: Return to activities she does at her church; Return to [employer]; shop without issues;   NEXT MD VISIT: July 2025 with Dr Joshua  OBJECTIVE:  Note: Objective measures were completed at Evaluation unless otherwise noted.  DIAGNOSTIC FINDINGS:  12/29/2023 Lumbar CT without contrast  IMPRESSION: 1. Transitional lumbosacral anatomy. Designating normal lumbar segmentation results in hypoplastic ribs at L1. Correlation with radiographs is recommended prior to any operative intervention. 2. Levoconvex lumbar scoliosis with degenerative spondylolisthesis at L2-L3, L5-S1. Advanced endplate degeneration at the former, facet arthropathy at the latter. 3. No convincing spinal stenosis by CT. But moderate right lateral recess and severe right neural foraminal stenosis at L2-L3. Mild left lateral recess stenosis at L3-L4. 4.  Aortic Atherosclerosis (ICD10-I70.0).   PATIENT SURVEYS:  Eval:  Modified Oswestry 17/15 34%  05/03/2024:  Modified Oswestry Low Back Pain Disability Questionnaire: 17 / 50 = 34.0 %  COGNITION: Overall cognitive status: Within functional limits for tasks assessed     SENSATION: WFL   POSTURE: rounded shoulders  SKIN: Incision healing well; no warmth noted  LUMBAR ROM: NT due to post surgery   LOWER EXTREMITY ROM:   WFL   LOWER EXTREMITY MMT:  Grossly 4-/5   FUNCTIONAL TESTS:  Eval: 5 times sit to stand: 24.35 sec w. UE support Timed up and go (TUG): 27.10 sec with str cane. Pt experienced some dizziness after completing turn  03/21/2024: Modified  CTSIB:  Condition 1- 30 sec (but self corrected loss of balance at 20 sec), Condition 2- 5 sec, Condition 3- 30 sec (with increased sway), Condition 4- 2 sec.  Total- 67/120    03/23/2024 6 MWT with str cane:686ft- increased anterior Lt hip pain at 2 minute mark. Patient frequently touches walls when ambulating. She verbalized dizziness when people were walking around her.    04/12/2024: 5 times sit to stand: 11.77 sec with pain of 1/10 it's more of a throb, not sharp pain. Timed up and go (TUG): 12.69 sec without assistive device (with slight balance check with 180 degree turn)  04/19/2024: 5 times sit to stand: 10.99 sec Timed up and go (TUG): 10.14 sec with some slight self corrected balance checks Modified CTSIB:  Condition 1- 30 sec (very slight balance adjustments), Condition 2- 17 sec, Condition 3- 30 sec (with increased sway), Condition 4- 5 sec.  Total- 82/120  05/10/2024: 6 minute walk test:  1005 ft with pain in bilateral hips pain 3/10 starting at 3.5 min mark.  Only had to touch wall once  for balance. Modified CTSIB:  Condition 1- 30 sec, Condition 2- 15 sec, Condition 3- 30 sec (with increased sway), Condition 4-  20 sec.  Total- 95/120  GAIT: Distance walked: 5ft Assistive device utilized: Single point cane Level of assistance: Complete Independence Comments: decreased step length; patient experiences mild dizziness when turning  VESTIBULAR ASSESSMENT: 03/21/2024: Smooth pursuit:  nystagmus noted when looking to the right and up.  No nystagmus looking left Saccades:  WNL Gaze stabilization is WNL Head Impulse Test:  positive bilaterally, worse on right than left [place] negative bilaterally   TREATMENT DATE:  06/05/2024: Nustep level 5 x7  min with PT present to discuss status Standing on foam pad with eyes open 2x1 min without UE support and with close SBA (occasional CGA from PT) Standing on foam pad performing horizontal and vertical head turns 2x10 each  with light UE support of Parallel bars Standing on foam pad with eyes closed without UE support and close SBA/CGA 2x30 sec FWD step over 4 hurdles in parallel bars without UE support down and back x3 laps Side stepping over 4 hurdles in parallel bars without UE support down and back x3 laps Ambulation with horizontal head turns in parallel bars without UE support down and back x3 laps Ambulation with vertical head turns in parallel bars without UE support down and back x3 laps Marching on trampoline x1 min Seated transversus abdominus contraction by pressing ball into thighs 2x10 Seated hip adduction ball squeeze 2x10   05/31/2024: Nustep level 5 x7  min with PT present to discuss status Side stepping in // bars with green TB x 3 laps Lateral & backwards step with green TB around ankles x 10 each direction 6 inch step taps holding 5# KB x 20 each (had to stop due to feeling woozy) Seated ball press with purple ball x 20 Seated alt knee and hand press with purple ball x 10 each side Seated scaption 2# 2 x 10 Seated on pink dynadisk LAQ x 10 bilateral  Seated on pink dynadisk chest press 5# KB x 10 Seated on pink dynadisk ear to hip 5# KB x 10 each direction Seated on pink dynadisk shoulder row, abduction, ER, extension  with red TB  x 10 each direction Tandem walking in // bars x 2 laps no UE support      05/15/2024: Nustep level 3 x7  min with PT present to discuss status Marching on airex with 3# DB overhead x 10 each arm Standing rows with red tband x10, then standing on foam pad 2x10 (SBA from PT) Standing on foam pad performing shoulder extension with red tband 2x10 (SBA from PT) Tandem on airex beam (forwards & backwards) x 3 laps occasional UE support Standing rocker board for DF/PF x2 min' Unilateral farmer's carry 5# KB x 2 laps around cancer gym Seated stability ball rollout (forwards only) x 10  Seated on stability ball LAQ 2 x 10 bilateral (PT seated behind  patient) Seated on stability ball alt arm and knee raise x 10 bilateral (PT seated behind patient) Seated on stability ball chest press with 3# DB 2 x 10 (PT seated behind patient) Seated thoracic extension with long pink power band 2 x 10 6 inch step taps holding one 8# DB x 20 each hand    PATIENT EDUCATION:  Education details: Spinal precautions; log roll; POC Person educated: Patient Education method: Explanation, Demonstration, and Handouts Education comprehension: verbalized understanding, returned demonstration, and needs further education  HOME  EXERCISE PROGRAM: Access Code: [email or web address] URL: [email or web address] Date: 05/03/2024 Prepared by: Jarrell Sarye Kath  Exercises - Supine Transversus Abdominis Bracing - Hands on Stomach  - 1 x daily - 7 x weekly - 2 sets - 10 reps - 4 sec hold - Supine Diaphragmatic Breathing  - 1 x daily - 7 x weekly - 1 sets - 10 reps - Seated Long Arc Quad  - 1 x daily - 7 x weekly - 2 sets - 10 reps - 2 hold - Standing Heel Raise with Support  - 1 x daily - 7 x weekly - 2 sets - 10 reps - Seated Horizontal Smooth Pursuit  - 1 x daily - 7 x weekly - 2 sets - 10 reps - Seated Vertical Smooth Pursuit  - 1 x daily - 7 x weekly - 2 sets - 10 reps - Seated Proximal-Distal Smooth Pursuit  - 1 x daily - 7 x weekly - 2 sets - 10 reps - Seated Gaze Stabilization with Head Rotation  - 1 x daily - 7 x weekly - 2 sets - 30 sec hold - Seated Gaze Stabilization with Head Nod  - 1 x daily - 7 x weekly - 2 sets - 30 sec hold - Seated Scapular Retraction  - 1 x daily - 7 x weekly - 3 sets - 10 reps - Single Leg Balance with Clock Reach  - 1 x daily - 7 x weekly - 1-2 sets - 10 reps - Forward T with Counter Support  - 1 x daily - 7 x weekly - 1 sets - 10 reps - Forward Monster Walk with Resistance at Ankles and Counter Support  - 1 x daily - 7 x weekly - 2 sets - 10 reps - Side Stepping with Resistance at Ankles and Counter Support  - 1 x daily - 7 x weekly  - 2 sets - 10 reps  ASSESSMENT:  CLINICAL IMPRESSION: Ms Sarrazin presents to therapy with continued reports that her vestibular migraine is overall, improving with some residual effects.  Patient states that her balance is still feeling off.  Patient able to progress during today's session with more of a focus on her vestibular rehab.  Patient with most difficulty with head turns during activities and motions.  Patient with some increased right hip pain today, especially evident with stepping over hurdles.  Patient required close SBA/primarily light CGA with standing on foam pad with eyes closed.  Patient with slow gait speed noted today.  Patient continues to require skilled PT to progress towards goal related activities.     OBJECTIVE IMPAIRMENTS: Abnormal gait, decreased balance, decreased coordination, difficulty walking, decreased ROM, decreased strength, dizziness, increased muscle spasms, postural dysfunction, and pain.   ACTIVITY LIMITATIONS: carrying, lifting, bending, sitting, standing, squatting, transfers, and bed mobility  PARTICIPATION LIMITATIONS: cleaning, laundry, shopping, community activity, yard work, and church  PERSONAL FACTORS: Age, Fitness, and 1-2 comorbidities: Depression; osteoporosis are also affecting patient's functional outcome.   REHAB POTENTIAL: Good  CLINICAL DECISION MAKING: Evolving/moderate complexity  EVALUATION COMPLEXITY: Moderate   GOALS: Goals reviewed with patient? Yes  SHORT TERM GOALS: Target date: 04/17/2024  Patient will be independent with initial HEP. Baseline:  Goal status: Met on 03/27/24  2.  Patient will report > or = to 40% improvement in functional activity since starting PT. Baseline:  Goal status: Met on 03/27/24  3.  Patient to undergo vestibular assessment by vestibular physical therapist. Baseline:  Goal status: Met on 03/21/2024  4.  Patient will be able to perform STS with no UE support due to improved LE  strength. Baseline:  Goal status: Met on 03/27/24   LONG TERM GOALS: Target date: 07/07/2024 Patient will demonstrate independence in advanced HEP to allow for self progression after discharge. Baseline:  Goal status: Ongoing  2.  Patient will demonstrate improved walking tolerance and be able to grocery shop with minimal limitations due to her back pain. Baseline:  Goal status: Ongoing (see above for 6 min walk, still has pain starting at 3.5 min on 05/10/24)  3.  Patient will verbalize and demonstrate self-care strategies to manage pain including tissue mobility practices and change of position. Baseline:  Goal status: Ongoing  3.  Patient will reports > or = 70% improvement in her dizziness since starting PT. Baseline:  Goal status: Met on 04/12/24 (with patient reporting has improved 75% to her baseline)  4.  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  Baseline:  Goal status: Met on 04/21/24  5.  Patient will perform tug in < or = to 17 sec for decreased falls risk.  Baseline: 27.10sec Goal status: Partially Met (met for time, but one slight bobble with 180 degree turn on 04/12/24)    PLAN:  PT FREQUENCY: 1-2x/week  PT DURATION: 8 weeks  PLANNED INTERVENTIONS: 97164- PT Re-evaluation, 97110-Therapeutic exercises, 97530- Therapeutic activity, 97112- Neuromuscular re-education, 97535- Self Care, [id number]- Manual therapy, [phone]- Gait training, [phone]- Orthotic Fit/training, [phone]- Canalith repositioning, [id number]- Aquatic Therapy, [phone]- Electrical stimulation (unattended), 97016- Vasopneumatic device, [id number]- Ultrasound, [id number]- Ionotophoresis 4mg /ml Dexamethasone , Patient/Family education, Balance training, Stair training, Taping, Dry Needling, Joint mobilization, Joint manipulation, Vestibular training, Cryotherapy, and Moist heat.  PLAN FOR NEXT SESSION:  assess balance & vertigo migraine; hinging technique for lifting;  erector spinae strengthening; continue functional & core strengthening and balance    Jarrell Laming, PT, DPT 06/05/24, 10:16 AM  [facility] [address], [address] [place], [place] [address] Phone # [phone] Fax [phone]

## 2024-06-08 ENCOUNTER — Ambulatory Visit: Admitting: Physical Therapy

## 2024-06-08 ENCOUNTER — Encounter: Payer: Self-pay | Admitting: Physical Therapy

## 2024-06-08 DIAGNOSIS — R2689 Other abnormalities of gait and mobility: Secondary | ICD-10-CM

## 2024-06-08 DIAGNOSIS — M6281 Muscle weakness (generalized): Secondary | ICD-10-CM

## 2024-06-08 DIAGNOSIS — M5416 Radiculopathy, lumbar region: Secondary | ICD-10-CM

## 2024-06-08 DIAGNOSIS — R42 Dizziness and giddiness: Secondary | ICD-10-CM

## 2024-06-08 DIAGNOSIS — R2681 Unsteadiness on feet: Secondary | ICD-10-CM

## 2024-06-08 NOTE — Therapy (Signed)
 OUTPATIENT PHYSICAL THERAPY TREATMENT NOTE    Patient Name: Lori Frost MRN: [id number] DOB:[birth year], 78 y.o., female Today's Date: 06/08/2024   END OF SESSION:  PT End of Session - 06/08/24 1046     Visit Number 20    Date for PT Re-Evaluation 07/07/24    Authorization Type HTA    Progress Note Due on Visit 26    PT Start Time 0928    PT Stop Time 1014    PT Time Calculation (min) 46 min    Activity Tolerance Patient tolerated treatment well    Behavior During Therapy WFL for tasks assessed/performed                 Past Medical History:  Diagnosis Date   Allergy    Non-Allergic Rhinitis   Balance problem due to vestibular dysfunction    Bloating    CAD (coronary artery disease)    Carotid artery occlusion    Cataract    Chordae tendineae rupture (HCC)    Cyst of ovary, right    Depression    Diverticulosis    DJD (degenerative joint disease)    Eczema    Family history of adverse reaction to anesthesia    SISTER HEART STOPPED DURING [id number] UNKNOWN CAUSE -  SHE DIED 10 YRS LATER OF HEART ATTACK   GERD (gastroesophageal reflux disease)    ESOPHAGITIS PRESENCE NOT SPECIFIED   Granuloma annulare    History of hiatal hernia    History of recurrent UTI (urinary tract infection)    Hypercholesterolemia    Hyperlipidemia    Lower back pain    Meniere's disease    Migraine    Mitral valve regurgitation    MODERATE NORMAL, MILD AORTIC REGURGITATION, MILD TRICUSPID REGURGITATION, LVH, GRADE 1 DIASTOLIC DYSFUNCTION, ECHO 2018 SUGGESTS RUPTURED CHORDAE TENDINEAE ( DR. BLANCA)   MVP (mitral valve prolapse)    Myalgia    OA (osteoarthritis) of hip 02/12/2016   Osteoporosis    Other nonrheumatic mitral valve disorders    Psoriasis    Pupil dilated    DUE TO EYE DROPS FOR TX OF RT  EYE PROBLEM   Reactive hypoglycemia    Sensitive skin    Shingles    Vertigo    Vestibular neuronitis    Vitamin D deficiency    Past Surgical History:  Procedure  Laterality Date   EYE SURGERY Right 02-05-15   Cataract   EYE SURGERY Left 02-21-15   Cataract   TONSILLECTOMY     TOTAL HIP ARTHROPLASTY Right 02/12/2016   Procedure: RIGHT TOTAL HIP ARTHROPLASTY ANTERIOR APPROACH;  Surgeon: Dempsey Moan, MD;  Location: [facility];  Service: Orthopedics;  Laterality: Right;   Patient Active Problem List   Diagnosis Date Noted   S/P lumbar fusion 02/18/2024   Mitral valve regurgitation 08/14/2021   Coronary artery calcification 08/14/2021   Aortic atherosclerosis (HCC) 08/14/2021   Hyperlipidemia 08/14/2021   Carotid artery disease (HCC) 08/14/2021   OA (osteoarthritis) of hip 02/12/2016    PCP: Cleotilde Been, MD  REFERRING PROVIDER: Joshua Alm Hamilton, MD  REFERRING DIAG: M54.16 (ICD-10-CM) - Radiculopathy, lumbar region  Rationale for Evaluation and Treatment: Rehabilitation  THERAPY DIAG:  Radiculopathy, lumbar region  Muscle weakness (generalized)  Unsteadiness on feet  Dizziness and giddiness  Other abnormalities of gait and mobility  ONSET DATE: Surgery Date 02/18/2024  SUBJECTIVE:  SUBJECTIVE STATEMENT: Patient reports she is feeling some better today.  PERTINENT HISTORY:  Depression; osteoporosis; vertigo; Meniere's disease; hx Rt total hip arthroplasty ; scoliosis   PAIN:  Are you having pain? Yes: NPRS scale: 5-6/ 10  Pain location: right hip/leg Pain description: throbbing; burning; sore Aggravating factors: continuous movement; reaching in cabinets; walking in grocery store  Relieving factors: laying down  PRECAUTIONS: Back lumbar fusion 3/7/225. Lifting < 10 lbs  RED FLAGS: None   WEIGHT BEARING RESTRICTIONS: No  FALLS:  Has patient fallen in last 6 months? No  LIVING ENVIRONMENT: Lives with: lives alone Lives in:  House/apartment Stairs: Yes: Internal: 1  steps; none Has following equipment at home: Single point cane  OCCUPATION: Retired  PLOF: Independent, Independent with basic ADLs, Independent with household mobility without device, and Independent with gait  PATIENT GOALS: Return to activities she does at her church; Return to [employer]; shop without issues;   NEXT MD VISIT: July 2025 with Dr Joshua  OBJECTIVE:  Note: Objective measures were completed at Evaluation unless otherwise noted.  DIAGNOSTIC FINDINGS:  12/29/2023 Lumbar CT without contrast  IMPRESSION: 1. Transitional lumbosacral anatomy. Designating normal lumbar segmentation results in hypoplastic ribs at L1. Correlation with radiographs is recommended prior to any operative intervention. 2. Levoconvex lumbar scoliosis with degenerative spondylolisthesis at L2-L3, L5-S1. Advanced endplate degeneration at the former, facet arthropathy at the latter. 3. No convincing spinal stenosis by CT. But moderate right lateral recess and severe right neural foraminal stenosis at L2-L3. Mild left lateral recess stenosis at L3-L4. 4.  Aortic Atherosclerosis (ICD10-I70.0).   PATIENT SURVEYS:  Eval:  Modified Oswestry 17/15 34%  05/03/2024:  Modified Oswestry Low Back Pain Disability Questionnaire: 17 / 50 = 34.0 %  COGNITION: Overall cognitive status: Within functional limits for tasks assessed     SENSATION: WFL   POSTURE: rounded shoulders  SKIN: Incision healing well; no warmth noted  LUMBAR ROM: NT due to post surgery   LOWER EXTREMITY ROM:   WFL   LOWER EXTREMITY MMT:  Grossly 4-/5   FUNCTIONAL TESTS:  Eval: 5 times sit to stand: 24.35 sec w. UE support Timed up and go (TUG): 27.10 sec with str cane. Pt experienced some dizziness after completing turn  03/21/2024: Modified CTSIB:  Condition 1- 30 sec (but self corrected loss of balance at 20 sec), Condition 2- 5 sec, Condition 3- 30 sec (with increased  sway), Condition 4- 2 sec.  Total- 67/120    03/23/2024 6 MWT with str cane:657ft- increased anterior Lt hip pain at 2 minute mark. Patient frequently touches walls when ambulating. She verbalized dizziness when people were walking around her.    04/12/2024: 5 times sit to stand: 11.77 sec with pain of 1/10 it's more of a throb, not sharp pain. Timed up and go (TUG): 12.69 sec without assistive device (with slight balance check with 180 degree turn)  04/19/2024: 5 times sit to stand: 10.99 sec Timed up and go (TUG): 10.14 sec with some slight self corrected balance checks Modified CTSIB:  Condition 1- 30 sec (very slight balance adjustments), Condition 2- 17 sec, Condition 3- 30 sec (with increased sway), Condition 4- 5 sec.  Total- 82/120  05/10/2024: 6 minute walk test:  1005 ft with pain in bilateral hips pain 3/10 starting at 3.5 min mark.  Only had to touch wall once for balance. Modified CTSIB:  Condition 1- 30 sec, Condition 2- 15 sec, Condition 3- 30 sec (with increased sway), Condition 4-  20 sec.  Total- 95/120  GAIT: Distance walked: 36ft Assistive device utilized: Single point cane Level of assistance: Complete Independence Comments: decreased step length; patient experiences mild dizziness when turning  VESTIBULAR ASSESSMENT: 03/21/2024: Smooth pursuit:  nystagmus noted when looking to the right and up.  No nystagmus looking left Saccades:  WNL Gaze stabilization is WNL Head Impulse Test:  positive bilaterally, worse on right than left [place] negative bilaterally   TREATMENT DATE:  06/08/2024: Nustep level 5 x7  min with PT present to discuss status 6 inch step taps holding 10# KB x 20 each Standing on foam pad with eyes closed  2x1 min without UE support and with close SBA  Standing on foam pad performing horizontal and vertical head turns 2x10 each with light UE support of Parallel bars Laying on 1/2 foam roll shoulder abduction and ER x 12  Seated  transversus abdominus contraction by pressing ball into thighs 2x10 Seated on purple dynadisk shoulder flexion & scaption 2# x 10 Sit to stand holding 7# DB 2 x 10 FWD step over 4 hurdles in parallel bars without UE support down and back x3 laps Side stepping over 4 hurdles in parallel bars without UE support down and back x3 laps Rockerboard (M/Lt & Forwards backwards) x 2 min each direction - more challenging M/L Ambulation with horizontal head turns in parallel bars without UE support down and back x3 laps Ambulation with vertical head turns in parallel bars without UE support down and back x3 laps    06/05/2024: Nustep level 5 x7  min with PT present to discuss status Standing on foam pad with eyes open 2x1 min without UE support and with close SBA (occasional CGA from PT) Standing on foam pad performing horizontal and vertical head turns 2x10 each with light UE support of Parallel bars Standing on foam pad with eyes closed without UE support and close SBA/CGA 2x30 sec FWD step over 4 hurdles in parallel bars without UE support down and back x3 laps Side stepping over 4 hurdles in parallel bars without UE support down and back x3 laps Ambulation with horizontal head turns in parallel bars without UE support down and back x3 laps Ambulation with vertical head turns in parallel bars without UE support down and back x3 laps Marching on trampoline x1 min Seated transversus abdominus contraction by pressing ball into thighs 2x10 Seated hip adduction ball squeeze 2x10   05/31/2024: Nustep level 5 x7  min with PT present to discuss status Side stepping in // bars with green TB x 3 laps Lateral & backwards step with green TB around ankles x 10 each direction 6 inch step taps holding 5# KB x 20 each (had to stop due to feeling woozy) Seated ball press with purple ball x 20 Seated alt knee and hand press with purple ball x 10 each side Seated scaption 2# 2 x 10 Seated on pink dynadisk LAQ x  10 bilateral  Seated on pink dynadisk chest press 5# KB x 10 Seated on pink dynadisk ear to hip 5# KB x 10 each direction Seated on pink dynadisk shoulder row, abduction, ER, extension  with red TB  x 10 each direction Tandem walking in // bars x 2 laps no UE support      05/15/2024: Nustep level 3 x7  min with PT present to discuss status Marching on airex with 3# DB overhead x 10 each arm Standing rows with red tband x10, then standing on foam pad  2x10 (SBA from PT) Standing on foam pad performing shoulder extension with red tband 2x10 (SBA from PT) Tandem on airex beam (forwards & backwards) x 3 laps occasional UE support Standing rocker board for DF/PF x2 min' Unilateral farmer's carry 5# KB x 2 laps around cancer gym Seated stability ball rollout (forwards only) x 10  Seated on stability ball LAQ 2 x 10 bilateral (PT seated behind patient) Seated on stability ball alt arm and knee raise x 10 bilateral (PT seated behind patient) Seated on stability ball chest press with 3# DB 2 x 10 (PT seated behind patient) Seated thoracic extension with long pink power band 2 x 10 6 inch step taps holding one 8# DB x 20 each hand    PATIENT EDUCATION:  Education details: Spinal precautions; log roll; POC Person educated: Patient Education method: Explanation, Demonstration, and Handouts Education comprehension: verbalized understanding, returned demonstration, and needs further education  HOME EXERCISE PROGRAM: Access Code: [email or web address] URL: [email or web address] Date: 05/03/2024 Prepared by: Jarrell Menke  Exercises - Supine Transversus Abdominis Bracing - Hands on Stomach  - 1 x daily - 7 x weekly - 2 sets - 10 reps - 4 sec hold - Supine Diaphragmatic Breathing  - 1 x daily - 7 x weekly - 1 sets - 10 reps - Seated Long Arc Quad  - 1 x daily - 7 x weekly - 2 sets - 10 reps - 2 hold - Standing Heel Raise with Support  - 1 x daily - 7 x weekly - 2 sets - 10 reps - Seated  Horizontal Smooth Pursuit  - 1 x daily - 7 x weekly - 2 sets - 10 reps - Seated Vertical Smooth Pursuit  - 1 x daily - 7 x weekly - 2 sets - 10 reps - Seated Proximal-Distal Smooth Pursuit  - 1 x daily - 7 x weekly - 2 sets - 10 reps - Seated Gaze Stabilization with Head Rotation  - 1 x daily - 7 x weekly - 2 sets - 30 sec hold - Seated Gaze Stabilization with Head Nod  - 1 x daily - 7 x weekly - 2 sets - 30 sec hold - Seated Scapular Retraction  - 1 x daily - 7 x weekly - 3 sets - 10 reps - Single Leg Balance with Clock Reach  - 1 x daily - 7 x weekly - 1-2 sets - 10 reps - Forward T with Counter Support  - 1 x daily - 7 x weekly - 1 sets - 10 reps - Forward Monster Walk with Resistance at Ankles and Counter Support  - 1 x daily - 7 x weekly - 2 sets - 10 reps - Side Stepping with Resistance at Ankles and Counter Support  - 1 x daily - 7 x weekly - 2 sets - 10 reps  ASSESSMENT:  CLINICAL IMPRESSION: Ms Conde presents to therapy with increased right shoulder blade pain. Laying on a 1/2 foam roll performing shoulder ER and abduction aggravated her pain. She tolerated standing on foam pad with eyes closed for a longer period of time today. She required close guarded from PT. Noted improved performance with exercises on airex and with head turns. Patient will benefit from skilled PT to address the below impairments and improve overall function.     OBJECTIVE IMPAIRMENTS: Abnormal gait, decreased balance, decreased coordination, difficulty walking, decreased ROM, decreased strength, dizziness, increased muscle spasms, postural dysfunction, and pain.   ACTIVITY LIMITATIONS: carrying, lifting, bending, sitting, standing, squatting,  transfers, and bed mobility  PARTICIPATION LIMITATIONS: cleaning, laundry, shopping, community activity, yard work, and church  PERSONAL FACTORS: Age, Fitness, and 1-2 comorbidities: Depression; osteoporosis are also affecting patient's functional outcome.   REHAB  POTENTIAL: Good  CLINICAL DECISION MAKING: Evolving/moderate complexity  EVALUATION COMPLEXITY: Moderate   GOALS: Goals reviewed with patient? Yes  SHORT TERM GOALS: Target date: 04/17/2024  Patient will be independent with initial HEP. Baseline:  Goal status: Met on 03/27/24  2.  Patient will report > or = to 40% improvement in functional activity since starting PT. Baseline:  Goal status: Met on 03/27/24  3.  Patient to undergo vestibular assessment by vestibular physical therapist. Baseline:  Goal status: Met on 03/21/2024  4.  Patient will be able to perform STS with no UE support due to improved LE strength. Baseline:  Goal status: Met on 03/27/24   LONG TERM GOALS: Target date: 07/07/2024 Patient will demonstrate independence in advanced HEP to allow for self progression after discharge. Baseline:  Goal status: Ongoing  2.  Patient will demonstrate improved walking tolerance and be able to grocery shop with minimal limitations due to her back pain. Baseline:  Goal status: Ongoing (see above for 6 min walk, still has pain starting at 3.5 min on 05/10/24)  3.  Patient will verbalize and demonstrate self-care strategies to manage pain including tissue mobility practices and change of position. Baseline:  Goal status: Ongoing  3.  Patient will reports > or = 70% improvement in her dizziness since starting PT. Baseline:  Goal status: Met on 04/12/24 (with patient reporting has improved 75% to her baseline)  4.  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  Baseline:  Goal status: Met on 04/21/24  5.  Patient will perform tug in < or = to 17 sec for decreased falls risk.  Baseline: 27.10sec Goal status: Partially Met (met for time, but one slight bobble with 180 degree turn on 04/12/24)    PLAN:  PT FREQUENCY: 1-2x/week  PT DURATION: 8 weeks  PLANNED INTERVENTIONS: 97164- PT Re-evaluation,  97110-Therapeutic exercises, 97530- Therapeutic activity, 97112- Neuromuscular re-education, 97535- Self Care, [id number]- Manual therapy, [phone]- Gait training, [phone]- Orthotic Fit/training, [phone]- Canalith repositioning, [id number]- Aquatic Therapy, [phone]- Electrical stimulation (unattended), 97016- Vasopneumatic device, [id number]- Ultrasound, [id number]- Ionotophoresis 4mg /ml Dexamethasone , Patient/Family education, Balance training, Stair training, Taping, Dry Needling, Joint mobilization, Joint manipulation, Vestibular training, Cryotherapy, and Moist heat.  PLAN FOR NEXT SESSION:  assess balance & vertigo migraine; hinging technique for lifting; erector spinae strengthening; continue functional & core strengthening and balance     Kristeen Sar, PT 06/08/24 10:47 AM [facility] [address] [place], [place] [address] Phone # [phone] Fax [phone]

## 2024-06-13 ENCOUNTER — Ambulatory Visit: Attending: Neurological Surgery | Admitting: Physical Therapy

## 2024-06-13 ENCOUNTER — Encounter: Payer: Self-pay | Admitting: Physical Therapy

## 2024-06-13 DIAGNOSIS — M5416 Radiculopathy, lumbar region: Secondary | ICD-10-CM | POA: Diagnosis not present

## 2024-06-13 DIAGNOSIS — M6281 Muscle weakness (generalized): Secondary | ICD-10-CM | POA: Diagnosis not present

## 2024-06-13 DIAGNOSIS — R2681 Unsteadiness on feet: Secondary | ICD-10-CM | POA: Diagnosis not present

## 2024-06-13 DIAGNOSIS — R2689 Other abnormalities of gait and mobility: Secondary | ICD-10-CM | POA: Diagnosis not present

## 2024-06-13 DIAGNOSIS — R42 Dizziness and giddiness: Secondary | ICD-10-CM | POA: Diagnosis not present

## 2024-06-13 NOTE — Therapy (Signed)
 OUTPATIENT PHYSICAL THERAPY TREATMENT NOTE    Patient Name: Lori Frost MRN: [id number] DOB:1946/01/23, 78 y.o., female Today's Date: 06/13/2024   END OF SESSION:  PT End of Session - 06/13/24 1024     Visit Number 21    Date for PT Re-Evaluation 07/07/24    Authorization Type HTA    Progress Note Due on Visit 26    PT Start Time 0931    PT Stop Time 1012    PT Time Calculation (min) 41 min    Activity Tolerance Patient tolerated treatment well    Behavior During Therapy WFL for tasks assessed/performed                  Past Medical History:  Diagnosis Date   Allergy    Non-Allergic Rhinitis   Balance problem due to vestibular dysfunction    Bloating    CAD (coronary artery disease)    Carotid artery occlusion    Cataract    Chordae tendineae rupture (HCC)    Cyst of ovary, right    Depression    Diverticulosis    DJD (degenerative joint disease)    Eczema    Family history of adverse reaction to anesthesia    SISTER HEART STOPPED DURING [id number] UNKNOWN CAUSE -  SHE DIED 10 YRS LATER OF HEART ATTACK   GERD (gastroesophageal reflux disease)    ESOPHAGITIS PRESENCE NOT SPECIFIED   Granuloma annulare    History of hiatal hernia    History of recurrent UTI (urinary tract infection)    Hypercholesterolemia    Hyperlipidemia    Lower back pain    Meniere's disease    Migraine    Mitral valve regurgitation    MODERATE NORMAL, MILD AORTIC REGURGITATION, MILD TRICUSPID REGURGITATION, LVH, GRADE 1 DIASTOLIC DYSFUNCTION, ECHO 2018 SUGGESTS RUPTURED CHORDAE TENDINEAE ( DR. BLANCA)   MVP (mitral valve prolapse)    Myalgia    OA (osteoarthritis) of hip 02/12/2016   Osteoporosis    Other nonrheumatic mitral valve disorders    Psoriasis    Pupil dilated    DUE TO EYE DROPS FOR TX OF RT  EYE PROBLEM   Reactive hypoglycemia    Sensitive skin    Shingles    Vertigo    Vestibular neuronitis    Vitamin D deficiency    Past Surgical History:   Procedure Laterality Date   EYE SURGERY Right 02-05-15   Cataract   EYE SURGERY Left 02-21-15   Cataract   TONSILLECTOMY     TOTAL HIP ARTHROPLASTY Right 02/12/2016   Procedure: RIGHT TOTAL HIP ARTHROPLASTY ANTERIOR APPROACH;  Surgeon: Dempsey Moan, MD;  Location: [facility];  Service: Orthopedics;  Laterality: Right;   Patient Active Problem List   Diagnosis Date Noted   S/P lumbar fusion 02/18/2024   Mitral valve regurgitation 08/14/2021   Coronary artery calcification 08/14/2021   Aortic atherosclerosis (HCC) 08/14/2021   Hyperlipidemia 08/14/2021   Carotid artery disease (HCC) 08/14/2021   OA (osteoarthritis) of hip 02/12/2016    PCP: Cleotilde Been, MD  REFERRING PROVIDER: Joshua Alm Hamilton, MD  REFERRING DIAG: M54.16 (ICD-10-CM) - Radiculopathy, lumbar region  Rationale for Evaluation and Treatment: Rehabilitation  THERAPY DIAG:  Radiculopathy, lumbar region  Muscle weakness (generalized)  Unsteadiness on feet  Dizziness and giddiness  Other abnormalities of gait and mobility  ONSET DATE: Surgery Date 02/18/2024  SUBJECTIVE:  SUBJECTIVE STATEMENT: Patient reports she is doing good today.   PERTINENT HISTORY:  Depression; osteoporosis; vertigo; Meniere's disease; hx Rt total hip arthroplasty ; scoliosis   PAIN:  Are you having pain? Yes: NPRS scale: 5-6/ 10  Pain location: right hip/leg Pain description: throbbing; burning; sore Aggravating factors: continuous movement; reaching in cabinets; walking in grocery store  Relieving factors: laying down  PRECAUTIONS: Back lumbar fusion 3/7/225. Lifting < 10 lbs  RED FLAGS: None   WEIGHT BEARING RESTRICTIONS: No  FALLS:  Has patient fallen in last 6 months? No  LIVING ENVIRONMENT: Lives with: lives alone Lives in:  House/apartment Stairs: Yes: Internal: 1  steps; none Has following equipment at home: Single point cane  OCCUPATION: Retired  PLOF: Independent, Independent with basic ADLs, Independent with household mobility without device, and Independent with gait  PATIENT GOALS: Return to activities she does at her church; Return to [employer]; shop without issues;   NEXT MD VISIT: July 2025 with Dr Joshua  OBJECTIVE:  Note: Objective measures were completed at Evaluation unless otherwise noted.  DIAGNOSTIC FINDINGS:  12/29/2023 Lumbar CT without contrast  IMPRESSION: 1. Transitional lumbosacral anatomy. Designating normal lumbar segmentation results in hypoplastic ribs at L1. Correlation with radiographs is recommended prior to any operative intervention. 2. Levoconvex lumbar scoliosis with degenerative spondylolisthesis at L2-L3, L5-S1. Advanced endplate degeneration at the former, facet arthropathy at the latter. 3. No convincing spinal stenosis by CT. But moderate right lateral recess and severe right neural foraminal stenosis at L2-L3. Mild left lateral recess stenosis at L3-L4. 4.  Aortic Atherosclerosis (ICD10-I70.0).   PATIENT SURVEYS:  Eval:  Modified Oswestry 17/15 34%  05/03/2024:  Modified Oswestry Low Back Pain Disability Questionnaire: 17 / 50 = 34.0 %  COGNITION: Overall cognitive status: Within functional limits for tasks assessed     SENSATION: WFL   POSTURE: rounded shoulders  SKIN: Incision healing well; no warmth noted  LUMBAR ROM: NT due to post surgery   LOWER EXTREMITY ROM:   WFL   LOWER EXTREMITY MMT:  Grossly 4-/5   FUNCTIONAL TESTS:  Eval: 5 times sit to stand: 24.35 sec w. UE support Timed up and go (TUG): 27.10 sec with str cane. Pt experienced some dizziness after completing turn  03/21/2024: Modified CTSIB:  Condition 1- 30 sec (but self corrected loss of balance at 20 sec), Condition 2- 5 sec, Condition 3- 30 sec (with increased  sway), Condition 4- 2 sec.  Total- 67/120    03/23/2024 6 MWT with str cane:626ft- increased anterior Lt hip pain at 2 minute mark. Patient frequently touches walls when ambulating. She verbalized dizziness when people were walking around her.    04/12/2024: 5 times sit to stand: 11.77 sec with pain of 1/10 it's more of a throb, not sharp pain. Timed up and go (TUG): 12.69 sec without assistive device (with slight balance check with 180 degree turn)  04/19/2024: 5 times sit to stand: 10.99 sec Timed up and go (TUG): 10.14 sec with some slight self corrected balance checks Modified CTSIB:  Condition 1- 30 sec (very slight balance adjustments), Condition 2- 17 sec, Condition 3- 30 sec (with increased sway), Condition 4- 5 sec.  Total- 82/120  05/10/2024: 6 minute walk test:  1005 ft with pain in bilateral hips pain 3/10 starting at 3.5 min mark.  Only had to touch wall once for balance. Modified CTSIB:  Condition 1- 30 sec, Condition 2- 15 sec, Condition 3- 30 sec (with increased sway), Condition 4-  20 sec.  Total- 95/120  GAIT: Distance walked: 63ft Assistive device utilized: Single point cane Level of assistance: Complete Independence Comments: decreased step length; patient experiences mild dizziness when turning  VESTIBULAR ASSESSMENT: 03/21/2024: Smooth pursuit:  nystagmus noted when looking to the right and up.  No nystagmus looking left Saccades:  WNL Gaze stabilization is WNL Head Impulse Test:  positive bilaterally, worse on right than left [place] negative bilaterally   TREATMENT DATE:  06/13/2024: Nustep level 5 x8  min with PT present to discuss status Marching on airex holding 5# KB x 20 each side Walking around ortho gym for processing  Standing on airex head turns (side to die and up/down) x 20 sec each no UE support 6 inch step taps holding 10# KB x 20 each Sit to stand holding 10# KB 2 x 8 Seated on purple dynadisk shoulder flexion & scaption 2# x 10  each Seated on purple dynadisk LAQ x 10 bilateral  Sitting on stability ball: march, pelvic tilts, chest press with 5# KB x 20 each exercise Rockerboard (M/Lt & Forwards backwards) x 2 min     06/08/2024: Nustep level 5 x7  min with PT present to discuss status 6 inch step taps holding 10# KB x 20 each Standing on foam pad with eyes closed  2x1 min without UE support and with close SBA  Standing on foam pad performing horizontal and vertical head turns 2x10 each with light UE support of Parallel bars Laying on 1/2 foam roll shoulder abduction and ER x 12  Seated transversus abdominus contraction by pressing ball into thighs 2x10 Seated on purple dynadisk shoulder flexion & scaption 2# x 10 Sit to stand holding 7# DB 2 x 10 FWD step over 4 hurdles in parallel bars without UE support down and back x3 laps Side stepping over 4 hurdles in parallel bars without UE support down and back x3 laps Rockerboard (M/Lt & Forwards backwards) x 2 min each direction - more challenging M/L Ambulation with horizontal head turns in parallel bars without UE support down and back x3 laps Ambulation with vertical head turns in parallel bars without UE support down and back x3 laps    06/05/2024: Nustep level 5 x7  min with PT present to discuss status Standing on foam pad with eyes open 2x1 min without UE support and with close SBA (occasional CGA from PT) Standing on foam pad performing horizontal and vertical head turns 2x10 each with light UE support of Parallel bars Standing on foam pad with eyes closed without UE support and close SBA/CGA 2x30 sec FWD step over 4 hurdles in parallel bars without UE support down and back x3 laps Side stepping over 4 hurdles in parallel bars without UE support down and back x3 laps Ambulation with horizontal head turns in parallel bars without UE support down and back x3 laps Ambulation with vertical head turns in parallel bars without UE support down and back x3  laps Marching on trampoline x1 min Seated transversus abdominus contraction by pressing ball into thighs 2x10 Seated hip adduction ball squeeze 2x10     PATIENT EDUCATION:  Education details: Spinal precautions; log roll; POC Person educated: Patient Education method: Explanation, Demonstration, and Handouts Education comprehension: verbalized understanding, returned demonstration, and needs further education  HOME EXERCISE PROGRAM: Access Code: [email or web address] URL: [email or web address] Date: 05/03/2024 Prepared by: Jarrell Menke  Exercises - Supine Transversus Abdominis Bracing - Hands on Stomach  - 1 x daily - 7 x weekly -  2 sets - 10 reps - 4 sec hold - Supine Diaphragmatic Breathing  - 1 x daily - 7 x weekly - 1 sets - 10 reps - Seated Long Arc Quad  - 1 x daily - 7 x weekly - 2 sets - 10 reps - 2 hold - Standing Heel Raise with Support  - 1 x daily - 7 x weekly - 2 sets - 10 reps - Seated Horizontal Smooth Pursuit  - 1 x daily - 7 x weekly - 2 sets - 10 reps - Seated Vertical Smooth Pursuit  - 1 x daily - 7 x weekly - 2 sets - 10 reps - Seated Proximal-Distal Smooth Pursuit  - 1 x daily - 7 x weekly - 2 sets - 10 reps - Seated Gaze Stabilization with Head Rotation  - 1 x daily - 7 x weekly - 2 sets - 30 sec hold - Seated Gaze Stabilization with Head Nod  - 1 x daily - 7 x weekly - 2 sets - 30 sec hold - Seated Scapular Retraction  - 1 x daily - 7 x weekly - 3 sets - 10 reps - Single Leg Balance with Clock Reach  - 1 x daily - 7 x weekly - 1-2 sets - 10 reps - Forward T with Counter Support  - 1 x daily - 7 x weekly - 1 sets - 10 reps - Forward Monster Walk with Resistance at Ankles and Counter Support  - 1 x daily - 7 x weekly - 2 sets - 10 reps - Side Stepping with Resistance at Ankles and Counter Support  - 1 x daily - 7 x weekly - 2 sets - 10 reps  ASSESSMENT:  CLINICAL IMPRESSION: Ms Thoreson verbalizes feeling some better since her vestibular migraine flare, but  still not at baseline. When walking patient focuses on one spot when negotiating moving objects. [employer] walking around busy therapy gym trying not to avoid looking at moving objects. Patient had two bouts of unsteadiness but she was able to self recover with arm strategy. Overall, she tolerated treatment session well. PT provided verbal and visual cues as needed. Patient will benefit from skilled PT to address the below impairments and improve overall function.      OBJECTIVE IMPAIRMENTS: Abnormal gait, decreased balance, decreased coordination, difficulty walking, decreased ROM, decreased strength, dizziness, increased muscle spasms, postural dysfunction, and pain.   ACTIVITY LIMITATIONS: carrying, lifting, bending, sitting, standing, squatting, transfers, and bed mobility  PARTICIPATION LIMITATIONS: cleaning, laundry, shopping, community activity, yard work, and church  PERSONAL FACTORS: Age, Fitness, and 1-2 comorbidities: Depression; osteoporosis are also affecting patient's functional outcome.   REHAB POTENTIAL: Good  CLINICAL DECISION MAKING: Evolving/moderate complexity  EVALUATION COMPLEXITY: Moderate   GOALS: Goals reviewed with patient? Yes  SHORT TERM GOALS: Target date: 04/17/2024  Patient will be independent with initial HEP. Baseline:  Goal status: Met on 03/27/24  2.  Patient will report > or = to 40% improvement in functional activity since starting PT. Baseline:  Goal status: Met on 03/27/24  3.  Patient to undergo vestibular assessment by vestibular physical therapist. Baseline:  Goal status: Met on 03/21/2024  4.  Patient will be able to perform STS with no UE support due to improved LE strength. Baseline:  Goal status: Met on 03/27/24   LONG TERM GOALS: Target date: 07/07/2024 Patient will demonstrate independence in advanced HEP to allow for self progression after discharge. Baseline:  Goal status: Ongoing  2.  Patient will demonstrate  improved  walking tolerance and be able to grocery shop with minimal limitations due to her back pain. Baseline:  Goal status: Ongoing (see above for 6 min walk, still has pain starting at 3.5 min on 05/10/24)  3.  Patient will verbalize and demonstrate self-care strategies to manage pain including tissue mobility practices and change of position. Baseline:  Goal status: Ongoing  3.  Patient will reports > or = 70% improvement in her dizziness since starting PT. Baseline:  Goal status: Met on 04/12/24 (with patient reporting has improved 75% to her baseline)  4.  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  Baseline:  Goal status: Met on 04/21/24  5.  Patient will perform tug in < or = to 17 sec for decreased falls risk.  Baseline: 27.10sec Goal status: Partially Met (met for time, but one slight bobble with 180 degree turn on 04/12/24)    PLAN:  PT FREQUENCY: 1-2x/week  PT DURATION: 8 weeks  PLANNED INTERVENTIONS: 97164- PT Re-evaluation, 97110-Therapeutic exercises, 97530- Therapeutic activity, 97112- Neuromuscular re-education, 97535- Self Care, [id number]- Manual therapy, [phone]- Gait training, [phone]- Orthotic Fit/training, [phone]- Canalith repositioning, [id number]- Aquatic Therapy, [phone]- Electrical stimulation (unattended), 97016- Vasopneumatic device, [id number]- Ultrasound, [id number]- Ionotophoresis 4mg /ml Dexamethasone , Patient/Family education, Balance training, Stair training, Taping, Dry Needling, Joint mobilization, Joint manipulation, Vestibular training, Cryotherapy, and Moist heat.  PLAN FOR NEXT SESSION:  erector spinae strengthening; continue functional & core strengthening and balance   Kristeen Sar, PT 06/13/24 10:24 AM [facility] [address], [address] [place], [place] [address] Phone # [phone] Fax [phone]

## 2024-06-15 ENCOUNTER — Ambulatory Visit: Admitting: Rehabilitative and Restorative Service Providers"

## 2024-06-15 ENCOUNTER — Encounter: Payer: Self-pay | Admitting: Rehabilitative and Restorative Service Providers"

## 2024-06-15 DIAGNOSIS — R42 Dizziness and giddiness: Secondary | ICD-10-CM

## 2024-06-15 DIAGNOSIS — M5416 Radiculopathy, lumbar region: Secondary | ICD-10-CM

## 2024-06-15 DIAGNOSIS — R2689 Other abnormalities of gait and mobility: Secondary | ICD-10-CM

## 2024-06-15 DIAGNOSIS — M6281 Muscle weakness (generalized): Secondary | ICD-10-CM

## 2024-06-15 DIAGNOSIS — R2681 Unsteadiness on feet: Secondary | ICD-10-CM

## 2024-06-15 NOTE — Therapy (Signed)
 OUTPATIENT PHYSICAL THERAPY TREATMENT NOTE    Patient Name: Lori Frost MRN: [id number] DOB:[birth year], 78 y.o., female Today's Date: 06/15/2024   END OF SESSION:  PT End of Session - 06/15/24 0935     Visit Number 22    Date for PT Re-Evaluation 07/07/24    Authorization Type HTA    Progress Note Due on Visit 26    PT Start Time 0930    PT Stop Time 1010    PT Time Calculation (min) 40 min    Activity Tolerance Patient tolerated treatment well    Behavior During Therapy WFL for tasks assessed/performed                  Past Medical History:  Diagnosis Date   Allergy    Non-Allergic Rhinitis   Balance problem due to vestibular dysfunction    Bloating    CAD (coronary artery disease)    Carotid artery occlusion    Cataract    Chordae tendineae rupture (HCC)    Cyst of ovary, right    Depression    Diverticulosis    DJD (degenerative joint disease)    Eczema    Family history of adverse reaction to anesthesia    SISTER HEART STOPPED DURING [id number] UNKNOWN CAUSE -  SHE DIED 10 YRS LATER OF HEART ATTACK   GERD (gastroesophageal reflux disease)    ESOPHAGITIS PRESENCE NOT SPECIFIED   Granuloma annulare    History of hiatal hernia    History of recurrent UTI (urinary tract infection)    Hypercholesterolemia    Hyperlipidemia    Lower back pain    Meniere's disease    Migraine    Mitral valve regurgitation    MODERATE NORMAL, MILD AORTIC REGURGITATION, MILD TRICUSPID REGURGITATION, LVH, GRADE 1 DIASTOLIC DYSFUNCTION, ECHO 2018 SUGGESTS RUPTURED CHORDAE TENDINEAE ( DR. BLANCA)   MVP (mitral valve prolapse)    Myalgia    OA (osteoarthritis) of hip 02/12/2016   Osteoporosis    Other nonrheumatic mitral valve disorders    Psoriasis    Pupil dilated    DUE TO EYE DROPS FOR TX OF RT  EYE PROBLEM   Reactive hypoglycemia    Sensitive skin    Shingles    Vertigo    Vestibular neuronitis    Vitamin D deficiency    Past Surgical History:   Procedure Laterality Date   EYE SURGERY Right 02-05-15   Cataract   EYE SURGERY Left 02-21-15   Cataract   TONSILLECTOMY     TOTAL HIP ARTHROPLASTY Right 02/12/2016   Procedure: RIGHT TOTAL HIP ARTHROPLASTY ANTERIOR APPROACH;  Surgeon: Dempsey Moan, MD;  Location: [facility];  Service: Orthopedics;  Laterality: Right;   Patient Active Problem List   Diagnosis Date Noted   S/P lumbar fusion 02/18/2024   Mitral valve regurgitation 08/14/2021   Coronary artery calcification 08/14/2021   Aortic atherosclerosis (HCC) 08/14/2021   Hyperlipidemia 08/14/2021   Carotid artery disease (HCC) 08/14/2021   OA (osteoarthritis) of hip 02/12/2016    PCP: Cleotilde Been, MD  REFERRING PROVIDER: Joshua Alm Hamilton, MD  REFERRING DIAG: M54.16 (ICD-10-CM) - Radiculopathy, lumbar region  Rationale for Evaluation and Treatment: Rehabilitation  THERAPY DIAG:  Radiculopathy, lumbar region  Muscle weakness (generalized)  Unsteadiness on feet  Dizziness and giddiness  Other abnormalities of gait and mobility  ONSET DATE: Surgery Date 02/18/2024  SUBJECTIVE:  SUBJECTIVE STATEMENT: Patient reports no new complaints.  PERTINENT HISTORY:  Depression; osteoporosis; vertigo; Meniere's disease; hx Rt total hip arthroplasty ; scoliosis   PAIN:  Are you having pain? Yes: NPRS scale: 5/ 10  Pain location: right hip/leg Pain description: throbbing; burning; sore Aggravating factors: continuous movement; reaching in cabinets; walking in grocery store  Relieving factors: laying down  PRECAUTIONS: Back lumbar fusion 3/7/225. Lifting < 10 lbs  RED FLAGS: None   WEIGHT BEARING RESTRICTIONS: No  FALLS:  Has patient fallen in last 6 months? No  LIVING ENVIRONMENT: Lives with: lives alone Lives in:  House/apartment Stairs: Yes: Internal: 1  steps; none Has following equipment at home: Single point cane  OCCUPATION: Retired  PLOF: Independent, Independent with basic ADLs, Independent with household mobility without device, and Independent with gait  PATIENT GOALS: Return to activities she does at her church; Return to [employer]; shop without issues;   NEXT MD VISIT: July 2025 with Dr Joshua  OBJECTIVE:  Note: Objective measures were completed at Evaluation unless otherwise noted.  DIAGNOSTIC FINDINGS:  12/29/2023 Lumbar CT without contrast  IMPRESSION: 1. Transitional lumbosacral anatomy. Designating normal lumbar segmentation results in hypoplastic ribs at L1. Correlation with radiographs is recommended prior to any operative intervention. 2. Levoconvex lumbar scoliosis with degenerative spondylolisthesis at L2-L3, L5-S1. Advanced endplate degeneration at the former, facet arthropathy at the latter. 3. No convincing spinal stenosis by CT. But moderate right lateral recess and severe right neural foraminal stenosis at L2-L3. Mild left lateral recess stenosis at L3-L4. 4.  Aortic Atherosclerosis (ICD10-I70.0).   PATIENT SURVEYS:  Eval:  Modified Oswestry 17/15 34%  05/03/2024:  Modified Oswestry Low Back Pain Disability Questionnaire: 17 / 50 = 34.0 %  COGNITION: Overall cognitive status: Within functional limits for tasks assessed     SENSATION: WFL   POSTURE: rounded shoulders  SKIN: Incision healing well; no warmth noted  LUMBAR ROM: NT due to post surgery   LOWER EXTREMITY ROM:   WFL   LOWER EXTREMITY MMT:  Grossly 4-/5   FUNCTIONAL TESTS:  Eval: 5 times sit to stand: 24.35 sec w. UE support Timed up and go (TUG): 27.10 sec with str cane. Pt experienced some dizziness after completing turn  03/21/2024: Modified CTSIB:  Condition 1- 30 sec (but self corrected loss of balance at 20 sec), Condition 2- 5 sec, Condition 3- 30 sec (with increased  sway), Condition 4- 2 sec.  Total- 67/120    03/23/2024 6 MWT with str cane:627ft- increased anterior Lt hip pain at 2 minute mark. Patient frequently touches walls when ambulating. She verbalized dizziness when people were walking around her.    04/12/2024: 5 times sit to stand: 11.77 sec with pain of 1/10 it's more of a throb, not sharp pain. Timed up and go (TUG): 12.69 sec without assistive device (with slight balance check with 180 degree turn)  04/19/2024: 5 times sit to stand: 10.99 sec Timed up and go (TUG): 10.14 sec with some slight self corrected balance checks Modified CTSIB:  Condition 1- 30 sec (very slight balance adjustments), Condition 2- 17 sec, Condition 3- 30 sec (with increased sway), Condition 4- 5 sec.  Total- 82/120  05/10/2024: 6 minute walk test:  1005 ft with pain in bilateral hips pain 3/10 starting at 3.5 min mark.  Only had to touch wall once for balance. Modified CTSIB:  Condition 1- 30 sec, Condition 2- 15 sec, Condition 3- 30 sec (with increased sway), Condition 4-  20 sec.  Total- 95/120  GAIT: Distance walked: 64ft Assistive device utilized: Single point cane Level of assistance: Complete Independence Comments: decreased step length; patient experiences mild dizziness when turning  VESTIBULAR ASSESSMENT: 03/21/2024: Smooth pursuit:  nystagmus noted when looking to the right and up.  No nystagmus looking left Saccades:  WNL Gaze stabilization is WNL Head Impulse Test:  positive bilaterally, worse on right than left [place] negative bilaterally   TREATMENT DATE:  06/15/2024: Nustep level 5 x6 min with PT present to discuss status Marching on airex holding 5# kettlebell x 20 each side Standing on airex head turns (side to die and up/down) 2 x 20 sec each no UE support 6 inch step taps holding 10# kettlebell x 20 each FWD step ups with unilateral hand on rail and other hand holding 10# kettlebell x10 bilat Sit to stand holding 10# kettlebell 2  x 8 Sit to stand on foam pad x5 Seated on dynadisk shoulder flexion & scaption 2# 2 x 10 each Seated on dynadisk LAQ 2 x 10 bilateral  Seated on dynadisk:  4 way pelvic tilts x10 each Seated on dynadisk:  chest press with 5# kettlebell x20 Ambulation with horizontal head turns in parallel bars without UE support down and back x2 laps   06/13/2024: Nustep level 5 x8  min with PT present to discuss status Marching on airex holding 5# KB x 20 each side Walking around ortho gym for processing  Standing on airex head turns (side to die and up/down) x 20 sec each no UE support 6 inch step taps holding 10# KB x 20 each Sit to stand holding 10# KB 2 x 8 Seated on purple dynadisk shoulder flexion & scaption 2# x 10 each Seated on purple dynadisk LAQ x 10 bilateral  Sitting on stability ball: march, pelvic tilts, chest press with 5# KB x 20 each exercise Rockerboard (M/Lt & Forwards backwards) x 2 min     06/08/2024: Nustep level 5 x7  min with PT present to discuss status 6 inch step taps holding 10# KB x 20 each Standing on foam pad with eyes closed  2x1 min without UE support and with close SBA  Standing on foam pad performing horizontal and vertical head turns 2x10 each with light UE support of Parallel bars Laying on 1/2 foam roll shoulder abduction and ER x 12  Seated transversus abdominus contraction by pressing ball into thighs 2x10 Seated on purple dynadisk shoulder flexion & scaption 2# x 10 Sit to stand holding 7# DB 2 x 10 FWD step over 4 hurdles in parallel bars without UE support down and back x3 laps Side stepping over 4 hurdles in parallel bars without UE support down and back x3 laps Rockerboard (M/Lt & Forwards backwards) x 2 min each direction - more challenging M/L Ambulation with horizontal head turns in parallel bars without UE support down and back x3 laps Ambulation with vertical head turns in parallel bars without UE support down and back x3 laps   PATIENT  EDUCATION:  Education details: Spinal precautions; log roll; POC Person educated: Patient Education method: Explanation, Demonstration, and Handouts Education comprehension: verbalized understanding, returned demonstration, and needs further education  HOME EXERCISE PROGRAM: Access Code: [email or web address] URL: [email or web address] Date: 05/03/2024 Prepared by: Jarrell Malone Vanblarcom  Exercises - Supine Transversus Abdominis Bracing - Hands on Stomach  - 1 x daily - 7 x weekly - 2 sets - 10 reps - 4 sec hold - Supine Diaphragmatic Breathing  - 1 x daily -  7 x weekly - 1 sets - 10 reps - Seated Long Arc Quad  - 1 x daily - 7 x weekly - 2 sets - 10 reps - 2 hold - Standing Heel Raise with Support  - 1 x daily - 7 x weekly - 2 sets - 10 reps - Seated Horizontal Smooth Pursuit  - 1 x daily - 7 x weekly - 2 sets - 10 reps - Seated Vertical Smooth Pursuit  - 1 x daily - 7 x weekly - 2 sets - 10 reps - Seated Proximal-Distal Smooth Pursuit  - 1 x daily - 7 x weekly - 2 sets - 10 reps - Seated Gaze Stabilization with Head Rotation  - 1 x daily - 7 x weekly - 2 sets - 30 sec hold - Seated Gaze Stabilization with Head Nod  - 1 x daily - 7 x weekly - 2 sets - 30 sec hold - Seated Scapular Retraction  - 1 x daily - 7 x weekly - 3 sets - 10 reps - Single Leg Balance with Clock Reach  - 1 x daily - 7 x weekly - 1-2 sets - 10 reps - Forward T with Counter Support  - 1 x daily - 7 x weekly - 1 sets - 10 reps - Forward Monster Walk with Resistance at Ankles and Counter Support  - 1 x daily - 7 x weekly - 2 sets - 10 reps - Side Stepping with Resistance at Ankles and Counter Support  - 1 x daily - 7 x weekly - 2 sets - 10 reps  ASSESSMENT:  CLINICAL IMPRESSION: Ms Dooner presents to skilled PT stating that she is doing well and progressing towards goal related activities. Patient continues to progress towards vestibular rehab and improved balance.  Patient continues to progress with increased core  strengthening and balance.  Patient continues to require skilled PT to progress towards goal related activities.    OBJECTIVE IMPAIRMENTS: Abnormal gait, decreased balance, decreased coordination, difficulty walking, decreased ROM, decreased strength, dizziness, increased muscle spasms, postural dysfunction, and pain.   ACTIVITY LIMITATIONS: carrying, lifting, bending, sitting, standing, squatting, transfers, and bed mobility  PARTICIPATION LIMITATIONS: cleaning, laundry, shopping, community activity, yard work, and church  PERSONAL FACTORS: Age, Fitness, and 1-2 comorbidities: Depression; osteoporosis are also affecting patient's functional outcome.   REHAB POTENTIAL: Good  CLINICAL DECISION MAKING: Evolving/moderate complexity  EVALUATION COMPLEXITY: Moderate   GOALS: Goals reviewed with patient? Yes  SHORT TERM GOALS: Target date: 04/17/2024  Patient will be independent with initial HEP. Baseline:  Goal status: Met on 03/27/24  2.  Patient will report > or = to 40% improvement in functional activity since starting PT. Baseline:  Goal status: Met on 03/27/24  3.  Patient to undergo vestibular assessment by vestibular physical therapist. Baseline:  Goal status: Met on 03/21/2024  4.  Patient will be able to perform STS with no UE support due to improved LE strength. Baseline:  Goal status: Met on 03/27/24   LONG TERM GOALS: Target date: 07/07/2024 Patient will demonstrate independence in advanced HEP to allow for self progression after discharge. Baseline:  Goal status: Ongoing  2.  Patient will demonstrate improved walking tolerance and be able to grocery shop with minimal limitations due to her back pain. Baseline:  Goal status: Ongoing (see above for 6 min walk, still has pain starting at 3.5 min on 05/10/24)  3.  Patient will verbalize and demonstrate self-care strategies to manage pain including tissue mobility practices and  change of position. Baseline:  Goal status:  Ongoing  3.  Patient will reports > or = 70% improvement in her dizziness since starting PT. Baseline:  Goal status: Met on 04/12/24 (with patient reporting has improved 75% to her baseline)  4.  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  Baseline:  Goal status: Met on 04/21/24  5.  Patient will perform tug in < or = to 17 sec for decreased falls risk.  Baseline: 27.10sec Goal status: Partially Met (met for time, but one slight bobble with 180 degree turn on 04/12/24)    PLAN:  PT FREQUENCY: 1-2x/week  PT DURATION: 8 weeks  PLANNED INTERVENTIONS: 97164- PT Re-evaluation, 97110-Therapeutic exercises, 97530- Therapeutic activity, 97112- Neuromuscular re-education, 97535- Self Care, [id number]- Manual therapy, [phone]- Gait training, [phone]- Orthotic Fit/training, [phone]- Canalith repositioning, [id number]- Aquatic Therapy, [phone]- Electrical stimulation (unattended), 97016- Vasopneumatic device, [id number]- Ultrasound, [id number]- Ionotophoresis 4mg /ml Dexamethasone , Patient/Family education, Balance training, Stair training, Taping, Dry Needling, Joint mobilization, Joint manipulation, Vestibular training, Cryotherapy, and Moist heat.  PLAN FOR NEXT SESSION:  erector spinae strengthening; continue functional & core strengthening and balance   Jarrell Laming, PT, DPT 06/15/24, 10:19 AM  [facility] [address] [place], [place] [address] Phone # [phone] Fax [phone]

## 2024-06-20 ENCOUNTER — Encounter: Payer: Self-pay | Admitting: Rehabilitative and Restorative Service Providers"

## 2024-06-20 ENCOUNTER — Ambulatory Visit: Admitting: Rehabilitative and Restorative Service Providers"

## 2024-06-20 DIAGNOSIS — R2681 Unsteadiness on feet: Secondary | ICD-10-CM

## 2024-06-20 DIAGNOSIS — M6281 Muscle weakness (generalized): Secondary | ICD-10-CM

## 2024-06-20 DIAGNOSIS — M5416 Radiculopathy, lumbar region: Secondary | ICD-10-CM | POA: Diagnosis not present

## 2024-06-20 DIAGNOSIS — R42 Dizziness and giddiness: Secondary | ICD-10-CM

## 2024-06-20 NOTE — Therapy (Signed)
 OUTPATIENT PHYSICAL THERAPY TREATMENT NOTE    Patient Name: Lori Frost MRN: [id number] DOB:03/23/1946, 78 y.o., female Today's Date: 06/20/2024   END OF SESSION:  PT End of Session - 06/20/24 0932     Visit Number 23    Date for PT Re-Evaluation 07/07/24    Authorization Type HTA    Progress Note Due on Visit 26    PT Start Time 0930    PT Stop Time 1010    PT Time Calculation (min) 40 min    Activity Tolerance Patient tolerated treatment well    Behavior During Therapy WFL for tasks assessed/performed                  Past Medical History:  Diagnosis Date   Allergy    Non-Allergic Rhinitis   Balance problem due to vestibular dysfunction    Bloating    CAD (coronary artery disease)    Carotid artery occlusion    Cataract    Chordae tendineae rupture (HCC)    Cyst of ovary, right    Depression    Diverticulosis    DJD (degenerative joint disease)    Eczema    Family history of adverse reaction to anesthesia    SISTER HEART STOPPED DURING [id number] UNKNOWN CAUSE -  SHE DIED 10 YRS LATER OF HEART ATTACK   GERD (gastroesophageal reflux disease)    ESOPHAGITIS PRESENCE NOT SPECIFIED   Granuloma annulare    History of hiatal hernia    History of recurrent UTI (urinary tract infection)    Hypercholesterolemia    Hyperlipidemia    Lower back pain    Meniere's disease    Migraine    Mitral valve regurgitation    MODERATE NORMAL, MILD AORTIC REGURGITATION, MILD TRICUSPID REGURGITATION, LVH, GRADE 1 DIASTOLIC DYSFUNCTION, ECHO 2018 SUGGESTS RUPTURED CHORDAE TENDINEAE ( DR. BLANCA)   MVP (mitral valve prolapse)    Myalgia    OA (osteoarthritis) of hip 02/12/2016   Osteoporosis    Other nonrheumatic mitral valve disorders    Psoriasis    Pupil dilated    DUE TO EYE DROPS FOR TX OF RT  EYE PROBLEM   Reactive hypoglycemia    Sensitive skin    Shingles    Vertigo    Vestibular neuronitis    Vitamin D deficiency    Past Surgical History:   Procedure Laterality Date   EYE SURGERY Right 02-05-15   Cataract   EYE SURGERY Left 02-21-15   Cataract   TONSILLECTOMY     TOTAL HIP ARTHROPLASTY Right 02/12/2016   Procedure: RIGHT TOTAL HIP ARTHROPLASTY ANTERIOR APPROACH;  Surgeon: Dempsey Moan, MD;  Location: [facility];  Service: Orthopedics;  Laterality: Right;   Patient Active Problem List   Diagnosis Date Noted   S/P lumbar fusion 02/18/2024   Mitral valve regurgitation 08/14/2021   Coronary artery calcification 08/14/2021   Aortic atherosclerosis (HCC) 08/14/2021   Hyperlipidemia 08/14/2021   Carotid artery disease (HCC) 08/14/2021   OA (osteoarthritis) of hip 02/12/2016    PCP: Cleotilde Been, MD  REFERRING PROVIDER: Joshua Alm Hamilton, MD  REFERRING DIAG: M54.16 (ICD-10-CM) - Radiculopathy, lumbar region  Rationale for Evaluation and Treatment: Rehabilitation  THERAPY DIAG:  Radiculopathy, lumbar region  Muscle weakness (generalized)  Unsteadiness on feet  Dizziness and giddiness  ONSET DATE: Surgery Date 02/18/2024  SUBJECTIVE:  SUBJECTIVE STATEMENT: Patient states that she is still having some right sided pain.  PERTINENT HISTORY:  Depression; osteoporosis; vertigo; Meniere's disease; hx Rt total hip arthroplasty ; scoliosis   PAIN:  Are you having pain? Yes: NPRS scale: 4/ 10  Pain location: right hip/leg Pain description: throbbing; burning; sore Aggravating factors: continuous movement; reaching in cabinets; walking in grocery store  Relieving factors: laying down  PRECAUTIONS: Back lumbar fusion 3/7/225. Lifting < 10 lbs  RED FLAGS: None   WEIGHT BEARING RESTRICTIONS: No  FALLS:  Has patient fallen in last 6 months? No  LIVING ENVIRONMENT: Lives with: lives alone Lives in: House/apartment Stairs: Yes:  Internal: 1  steps; none Has following equipment at home: Single point cane  OCCUPATION: Retired  PLOF: Independent, Independent with basic ADLs, Independent with household mobility without device, and Independent with gait  PATIENT GOALS: Return to activities she does at her church; Return to [employer]; shop without issues;   NEXT MD VISIT: July 2025 with Dr Joshua  OBJECTIVE:  Note: Objective measures were completed at Evaluation unless otherwise noted.  DIAGNOSTIC FINDINGS:  12/29/2023 Lumbar CT without contrast  IMPRESSION: 1. Transitional lumbosacral anatomy. Designating normal lumbar segmentation results in hypoplastic ribs at L1. Correlation with radiographs is recommended prior to any operative intervention. 2. Levoconvex lumbar scoliosis with degenerative spondylolisthesis at L2-L3, L5-S1. Advanced endplate degeneration at the former, facet arthropathy at the latter. 3. No convincing spinal stenosis by CT. But moderate right lateral recess and severe right neural foraminal stenosis at L2-L3. Mild left lateral recess stenosis at L3-L4. 4.  Aortic Atherosclerosis (ICD10-I70.0).   PATIENT SURVEYS:  Eval:  Modified Oswestry 17/15 34%  05/03/2024:  Modified Oswestry Low Back Pain Disability Questionnaire: 17 / 50 = 34.0 %  COGNITION: Overall cognitive status: Within functional limits for tasks assessed     SENSATION: WFL   POSTURE: rounded shoulders  SKIN: Incision healing well; no warmth noted  LUMBAR ROM: NT due to post surgery   LOWER EXTREMITY ROM:   WFL   LOWER EXTREMITY MMT:  Grossly 4-/5   FUNCTIONAL TESTS:  Eval: 5 times sit to stand: 24.35 sec w. UE support Timed up and go (TUG): 27.10 sec with str cane. Pt experienced some dizziness after completing turn  03/21/2024: Modified CTSIB:  Condition 1- 30 sec (but self corrected loss of balance at 20 sec), Condition 2- 5 sec, Condition 3- 30 sec (with increased sway), Condition 4- 2 sec.   Total- 67/120    03/23/2024 6 MWT with str cane:685ft- increased anterior Lt hip pain at 2 minute mark. Patient frequently touches walls when ambulating. She verbalized dizziness when people were walking around her.    04/12/2024: 5 times sit to stand: 11.77 sec with pain of 1/10 it's more of a throb, not sharp pain. Timed up and go (TUG): 12.69 sec without assistive device (with slight balance check with 180 degree turn)  04/19/2024: 5 times sit to stand: 10.99 sec Timed up and go (TUG): 10.14 sec with some slight self corrected balance checks Modified CTSIB:  Condition 1- 30 sec (very slight balance adjustments), Condition 2- 17 sec, Condition 3- 30 sec (with increased sway), Condition 4- 5 sec.  Total- 82/120  05/10/2024: 6 minute walk test:  1005 ft with pain in bilateral hips pain 3/10 starting at 3.5 min mark.  Only had to touch wall once for balance. Modified CTSIB:  Condition 1- 30 sec, Condition 2- 15 sec, Condition 3- 30 sec (with increased  sway), Condition 4-  20 sec.  Total- 95/120  GAIT: Distance walked: 60ft Assistive device utilized: Single point cane Level of assistance: Complete Independence Comments: decreased step length; patient experiences mild dizziness when turning  VESTIBULAR ASSESSMENT: 03/21/2024: Smooth pursuit:  nystagmus noted when looking to the right and up.  No nystagmus looking left Saccades:  WNL Gaze stabilization is WNL Head Impulse Test:  positive bilaterally, worse on right than left [place] negative bilaterally   TREATMENT DATE:  06/20/2024: Nustep level 5 x6 min with PT present to discuss status Marching on airex holding 5# kettlebell x 20 each side Standing on airex head turns (side to die and up/down) 2 x 20 sec each no UE support 6 inch step taps holding 8# dumb bell x 20 each FWD step ups with unilateral hand on rail and other hand holding 8# dumbbell x10 bilat Sit to stand holding 8# dumbbell 2 x 8 Sit to stand on foam pad  2x10 Seated on dynadisk shoulder flexion & scaption 2# 2 x 10 each Seated on dynadisk LAQ 2 x 10 bilateral  Seated on dynadisk:  4 way pelvic tilts x10 each Seated on dynadisk:  chest press with 3# dumbbells in bilat hands x20 Single leg on blue pod performing clock taps x10 bilat Ambulation with horizontal head turns in parallel bars without UE support down and back x2 laps   06/15/2024: Nustep level 5 x6 min with PT present to discuss status Marching on airex holding 5# kettlebell x 20 each side Standing on airex head turns (side to die and up/down) 2 x 20 sec each no UE support 6 inch step taps holding 10# kettlebell x 20 each FWD step ups with unilateral hand on rail and other hand holding 10# kettlebell x10 bilat Sit to stand holding 10# kettlebell 2 x 8 Sit to stand on foam pad x5 Seated on dynadisk shoulder flexion & scaption 2# 2 x 10 each Seated on dynadisk LAQ 2 x 10 bilateral  Seated on dynadisk:  4 way pelvic tilts x10 each Seated on dynadisk:  chest press with 5# kettlebell x20 Ambulation with horizontal head turns in parallel bars without UE support down and back x2 laps   06/13/2024: Nustep level 5 x8  min with PT present to discuss status Marching on airex holding 5# KB x 20 each side Walking around ortho gym for processing  Standing on airex head turns (side to die and up/down) x 20 sec each no UE support 6 inch step taps holding 10# KB x 20 each Sit to stand holding 10# KB 2 x 8 Seated on purple dynadisk shoulder flexion & scaption 2# x 10 each Seated on purple dynadisk LAQ x 10 bilateral  Sitting on stability ball: march, pelvic tilts, chest press with 5# KB x 20 each exercise Rockerboard (M/Lt & Forwards backwards) x 2 min      PATIENT EDUCATION:  Education details: Spinal precautions; log roll; POC Person educated: Patient Education method: Explanation, Demonstration, and Handouts Education comprehension: verbalized understanding, returned demonstration,  and needs further education  HOME EXERCISE PROGRAM: Access Code: [email or web address] URL: [email or web address] Date: 05/03/2024 Prepared by: Jarrell Melana Hingle  Exercises - Supine Transversus Abdominis Bracing - Hands on Stomach  - 1 x daily - 7 x weekly - 2 sets - 10 reps - 4 sec hold - Supine Diaphragmatic Breathing  - 1 x daily - 7 x weekly - 1 sets - 10 reps - Seated Long Arc Quad  - 1 x daily -  7 x weekly - 2 sets - 10 reps - 2 hold - Standing Heel Raise with Support  - 1 x daily - 7 x weekly - 2 sets - 10 reps - Seated Horizontal Smooth Pursuit  - 1 x daily - 7 x weekly - 2 sets - 10 reps - Seated Vertical Smooth Pursuit  - 1 x daily - 7 x weekly - 2 sets - 10 reps - Seated Proximal-Distal Smooth Pursuit  - 1 x daily - 7 x weekly - 2 sets - 10 reps - Seated Gaze Stabilization with Head Rotation  - 1 x daily - 7 x weekly - 2 sets - 30 sec hold - Seated Gaze Stabilization with Head Nod  - 1 x daily - 7 x weekly - 2 sets - 30 sec hold - Seated Scapular Retraction  - 1 x daily - 7 x weekly - 3 sets - 10 reps - Single Leg Balance with Clock Reach  - 1 x daily - 7 x weekly - 1-2 sets - 10 reps - Forward T with Counter Support  - 1 x daily - 7 x weekly - 1 sets - 10 reps - Forward Monster Walk with Resistance at Ankles and Counter Support  - 1 x daily - 7 x weekly - 2 sets - 10 reps - Side Stepping with Resistance at Ankles and Counter Support  - 1 x daily - 7 x weekly - 2 sets - 10 reps  ASSESSMENT:  CLINICAL IMPRESSION: Ms Esh presents to skilled PT with continued complaints of the right sided pain.  Patient with continued decreased balance secondary to her Meniere's disease and requires occasional UE support of parallel bars during balance.  Patient continues to progress with improved core strengthening during session and is progressing with overall posture, but states that she has to be mindful of her scoliosis.      OBJECTIVE IMPAIRMENTS: Abnormal gait, decreased balance,  decreased coordination, difficulty walking, decreased ROM, decreased strength, dizziness, increased muscle spasms, postural dysfunction, and pain.   ACTIVITY LIMITATIONS: carrying, lifting, bending, sitting, standing, squatting, transfers, and bed mobility  PARTICIPATION LIMITATIONS: cleaning, laundry, shopping, community activity, yard work, and church  PERSONAL FACTORS: Age, Fitness, and 1-2 comorbidities: Depression; osteoporosis are also affecting patient's functional outcome.   REHAB POTENTIAL: Good  CLINICAL DECISION MAKING: Evolving/moderate complexity  EVALUATION COMPLEXITY: Moderate   GOALS: Goals reviewed with patient? Yes  SHORT TERM GOALS: Target date: 04/17/2024  Patient will be independent with initial HEP. Baseline:  Goal status: Met on 03/27/24  2.  Patient will report > or = to 40% improvement in functional activity since starting PT. Baseline:  Goal status: Met on 03/27/24  3.  Patient to undergo vestibular assessment by vestibular physical therapist. Baseline:  Goal status: Met on 03/21/2024  4.  Patient will be able to perform STS with no UE support due to improved LE strength. Baseline:  Goal status: Met on 03/27/24   LONG TERM GOALS: Target date: 07/07/2024 Patient will demonstrate independence in advanced HEP to allow for self progression after discharge. Baseline:  Goal status: Ongoing  2.  Patient will demonstrate improved walking tolerance and be able to grocery shop with minimal limitations due to her back pain. Baseline:  Goal status: Ongoing (see above for 6 min walk, still has pain starting at 3.5 min on 05/10/24)  3.  Patient will verbalize and demonstrate self-care strategies to manage pain including tissue mobility practices and change of position. Baseline:  Goal status:  Ongoing  3.  Patient will reports > or = 70% improvement in her dizziness since starting PT. Baseline:  Goal status: Met on 04/12/24 (with patient reporting has improved 75%  to her baseline)  4.  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  Baseline:  Goal status: Met on 04/21/24  5.  Patient will perform tug in < or = to 17 sec for decreased falls risk.  Baseline: 27.10sec Goal status: Partially Met (met for time, but one slight bobble with 180 degree turn on 04/12/24)    PLAN:  PT FREQUENCY: 1-2x/week  PT DURATION: 8 weeks  PLANNED INTERVENTIONS: 97164- PT Re-evaluation, 97110-Therapeutic exercises, 97530- Therapeutic activity, 97112- Neuromuscular re-education, 97535- Self Care, [id number]- Manual therapy, [phone]- Gait training, [id number]- Orthotic Fit/training, [phone]- Canalith repositioning, [id number]- Aquatic Therapy, [phone]- Electrical stimulation (unattended), 97016- Vasopneumatic device, [id number]- Ultrasound, [id number]- Ionotophoresis 4mg /ml Dexamethasone , Patient/Family education, Balance training, Stair training, Taping, Dry Needling, Joint mobilization, Joint manipulation, Vestibular training, Cryotherapy, and Moist heat.  PLAN FOR NEXT SESSION:  erector spinae strengthening; continue functional & core strengthening and balance   Jarrell Laming, PT, DPT 06/20/24, 10:14 AM  [facility] [address], [address] [place], [place] [address] Phone # [phone] Fax [phone]

## 2024-06-22 ENCOUNTER — Encounter: Payer: Self-pay | Admitting: Rehabilitative and Restorative Service Providers"

## 2024-06-22 ENCOUNTER — Ambulatory Visit: Admitting: Rehabilitative and Restorative Service Providers"

## 2024-06-22 DIAGNOSIS — R2689 Other abnormalities of gait and mobility: Secondary | ICD-10-CM

## 2024-06-22 DIAGNOSIS — M5416 Radiculopathy, lumbar region: Secondary | ICD-10-CM

## 2024-06-22 DIAGNOSIS — M6281 Muscle weakness (generalized): Secondary | ICD-10-CM

## 2024-06-22 DIAGNOSIS — R2681 Unsteadiness on feet: Secondary | ICD-10-CM

## 2024-06-22 DIAGNOSIS — R42 Dizziness and giddiness: Secondary | ICD-10-CM

## 2024-06-22 NOTE — Therapy (Signed)
 OUTPATIENT PHYSICAL THERAPY TREATMENT NOTE    Patient Name: Lori Frost MRN: [id number] DOB:1946-04-01, 78 y.o., female Today's Date: 06/22/2024   END OF SESSION:  PT End of Session - 06/22/24 1027     Visit Number 24    Date for PT Re-Evaluation 07/07/24    Authorization Type HTA    Progress Note Due on Visit 26    PT Start Time 1015    PT Stop Time 1055    PT Time Calculation (min) 40 min    Activity Tolerance Patient tolerated treatment well    Behavior During Therapy WFL for tasks assessed/performed                  Past Medical History:  Diagnosis Date   Allergy    Non-Allergic Rhinitis   Balance problem due to vestibular dysfunction    Bloating    CAD (coronary artery disease)    Carotid artery occlusion    Cataract    Chordae tendineae rupture (HCC)    Cyst of ovary, right    Depression    Diverticulosis    DJD (degenerative joint disease)    Eczema    Family history of adverse reaction to anesthesia    SISTER HEART STOPPED DURING [id number] UNKNOWN CAUSE -  SHE DIED 10 YRS LATER OF HEART ATTACK   GERD (gastroesophageal reflux disease)    ESOPHAGITIS PRESENCE NOT SPECIFIED   Granuloma annulare    History of hiatal hernia    History of recurrent UTI (urinary tract infection)    Hypercholesterolemia    Hyperlipidemia    Lower back pain    Meniere's disease    Migraine    Mitral valve regurgitation    MODERATE NORMAL, MILD AORTIC REGURGITATION, MILD TRICUSPID REGURGITATION, LVH, GRADE 1 DIASTOLIC DYSFUNCTION, ECHO 2018 SUGGESTS RUPTURED CHORDAE TENDINEAE ( DR. BLANCA)   MVP (mitral valve prolapse)    Myalgia    OA (osteoarthritis) of hip 02/12/2016   Osteoporosis    Other nonrheumatic mitral valve disorders    Psoriasis    Pupil dilated    DUE TO EYE DROPS FOR TX OF RT  EYE PROBLEM   Reactive hypoglycemia    Sensitive skin    Shingles    Vertigo    Vestibular neuronitis    Vitamin D deficiency    Past Surgical History:   Procedure Laterality Date   EYE SURGERY Right 02-05-15   Cataract   EYE SURGERY Left 02-21-15   Cataract   TONSILLECTOMY     TOTAL HIP ARTHROPLASTY Right 02/12/2016   Procedure: RIGHT TOTAL HIP ARTHROPLASTY ANTERIOR APPROACH;  Surgeon: Dempsey Moan, MD;  Location: [facility];  Service: Orthopedics;  Laterality: Right;   Patient Active Problem List   Diagnosis Date Noted   S/P lumbar fusion 02/18/2024   Mitral valve regurgitation 08/14/2021   Coronary artery calcification 08/14/2021   Aortic atherosclerosis (HCC) 08/14/2021   Hyperlipidemia 08/14/2021   Carotid artery disease (HCC) 08/14/2021   OA (osteoarthritis) of hip 02/12/2016    PCP: Cleotilde Been, MD  REFERRING PROVIDER: Joshua Alm Hamilton, MD  REFERRING DIAG: M54.16 (ICD-10-CM) - Radiculopathy, lumbar region  Rationale for Evaluation and Treatment: Rehabilitation  THERAPY DIAG:  Radiculopathy, lumbar region  Muscle weakness (generalized)  Unsteadiness on feet  Dizziness and giddiness  Other abnormalities of gait and mobility  ONSET DATE: Surgery Date 02/18/2024  SUBJECTIVE:  SUBJECTIVE STATEMENT: Patient states that her balance seems to be feeling better than last visit.  PERTINENT HISTORY:  Depression; osteoporosis; vertigo; Meniere's disease; hx Rt total hip arthroplasty ; scoliosis   PAIN:  Are you having pain? Yes: NPRS scale: 3-4/ 10  Pain location: right hip/leg Pain description: throbbing; burning; sore Aggravating factors: continuous movement; reaching in cabinets; walking in grocery store  Relieving factors: laying down  PRECAUTIONS: Back lumbar fusion 3/7/225. Lifting < 10 lbs  RED FLAGS: None   WEIGHT BEARING RESTRICTIONS: No  FALLS:  Has patient fallen in last 6 months? No  LIVING ENVIRONMENT: Lives with:  lives alone Lives in: House/apartment Stairs: Yes: Internal: 1  steps; none Has following equipment at home: Single point cane  OCCUPATION: Retired  PLOF: Independent, Independent with basic ADLs, Independent with household mobility without device, and Independent with gait  PATIENT GOALS: Return to activities she does at her church; Return to [employer]; shop without issues;   NEXT MD VISIT: July 2025 with Dr Joshua  OBJECTIVE:  Note: Objective measures were completed at Evaluation unless otherwise noted.  DIAGNOSTIC FINDINGS:  12/29/2023 Lumbar CT without contrast  IMPRESSION: 1. Transitional lumbosacral anatomy. Designating normal lumbar segmentation results in hypoplastic ribs at L1. Correlation with radiographs is recommended prior to any operative intervention. 2. Levoconvex lumbar scoliosis with degenerative spondylolisthesis at L2-L3, L5-S1. Advanced endplate degeneration at the former, facet arthropathy at the latter. 3. No convincing spinal stenosis by CT. But moderate right lateral recess and severe right neural foraminal stenosis at L2-L3. Mild left lateral recess stenosis at L3-L4. 4.  Aortic Atherosclerosis (ICD10-I70.0).   PATIENT SURVEYS:  Eval:  Modified Oswestry 17/15 34%  05/03/2024:  Modified Oswestry Low Back Pain Disability Questionnaire: 17 / 50 = 34.0 % 06/22/2024:  Modified Oswestry Low Back Pain Disability Questionnaire: 14 / 50 = 28.0 %  COGNITION: Overall cognitive status: Within functional limits for tasks assessed     SENSATION: WFL   POSTURE: rounded shoulders  SKIN: Incision healing well; no warmth noted  LUMBAR ROM: NT due to post surgery   LOWER EXTREMITY ROM:   WFL   LOWER EXTREMITY MMT:  Grossly 4-/5   FUNCTIONAL TESTS:  Eval: 5 times sit to stand: 24.35 sec w. UE support Timed up and go (TUG): 27.10 sec with str cane. Pt experienced some dizziness after completing turn  03/21/2024: Modified CTSIB:  Condition 1-  30 sec (but self corrected loss of balance at 20 sec), Condition 2- 5 sec, Condition 3- 30 sec (with increased sway), Condition 4- 2 sec.  Total- 67/120    03/23/2024 6 MWT with str cane:654ft- increased anterior Lt hip pain at 2 minute mark. Patient frequently touches walls when ambulating. She verbalized dizziness when people were walking around her.    04/12/2024: 5 times sit to stand: 11.77 sec with pain of 1/10 it's more of a throb, not sharp pain. Timed up and go (TUG): 12.69 sec without assistive device (with slight balance check with 180 degree turn)  04/19/2024: 5 times sit to stand: 10.99 sec Timed up and go (TUG): 10.14 sec with some slight self corrected balance checks Modified CTSIB:  Condition 1- 30 sec (very slight balance adjustments), Condition 2- 17 sec, Condition 3- 30 sec (with increased sway), Condition 4- 5 sec.  Total- 82/120  05/10/2024: 6 minute walk test:  1005 ft with pain in bilateral hips pain 3/10 starting at 3.5 min mark.  Only had to touch wall once for balance.  Modified CTSIB:  Condition 1- 30 sec, Condition 2- 15 sec, Condition 3- 30 sec (with increased sway), Condition 4-  20 sec.  Total- 95/120  GAIT: Distance walked: 51ft Assistive device utilized: Single point cane Level of assistance: Complete Independence Comments: decreased step length; patient experiences mild dizziness when turning  VESTIBULAR ASSESSMENT: 03/21/2024: Smooth pursuit:  nystagmus noted when looking to the right and up.  No nystagmus looking left Saccades:  WNL Gaze stabilization is WNL Head Impulse Test:  positive bilaterally, worse on right than left [place] negative bilaterally   TREATMENT DATE:  06/22/2024: Nustep level 5 x6 min with PT present to discuss status FWD and backwards tandem gait on AirEx beam x3 laps with with UE support on parallel bars Side stepping on on AirEx beam x3 laps down and back bilat Marching on airex holding 5# kettlebell x 20 each  side Standing on airex head turns (side to die and up/down) 2 x 20 sec each no UE support FWD step ups with unilateral hand on rail and other hand holding 8# dumbbell x10 bilat Sit to stand holding 8# dumbbell 2 x 8 Sit to stand on foam pad 2x10 Seated on dynadisk shoulder flexion & scaption 2# 2 x 10 each Seated on dynadisk LAQ 2 x 10 bilateral  Seated on dynadisk:  4 way pelvic tilts x10 each   06/20/2024: Nustep level 5 x6 min with PT present to discuss status Marching on airex holding 5# kettlebell x 20 each side Standing on airex head turns (side to die and up/down) 2 x 20 sec each no UE support 6 inch step taps holding 8# dumb bell x 20 each FWD step ups with unilateral hand on rail and other hand holding 8# dumbbell x10 bilat Sit to stand holding 8# dumbbell 2 x 8 Sit to stand on foam pad 2x10 Seated on dynadisk shoulder flexion & scaption 2# 2 x 10 each Seated on dynadisk LAQ 2 x 10 bilateral  Seated on dynadisk:  4 way pelvic tilts x10 each Seated on dynadisk:  chest press with 3# dumbbells in bilat hands x20 Single leg on blue pod performing clock taps x10 bilat Ambulation with horizontal head turns in parallel bars without UE support down and back x2 laps   06/15/2024: Nustep level 5 x6 min with PT present to discuss status Marching on airex holding 5# kettlebell x 20 each side Standing on airex head turns (side to die and up/down) 2 x 20 sec each no UE support 6 inch step taps holding 10# kettlebell x 20 each FWD step ups with unilateral hand on rail and other hand holding 10# kettlebell x10 bilat Sit to stand holding 10# kettlebell 2 x 8 Sit to stand on foam pad x5 Seated on dynadisk shoulder flexion & scaption 2# 2 x 10 each Seated on dynadisk LAQ 2 x 10 bilateral  Seated on dynadisk:  4 way pelvic tilts x10 each Seated on dynadisk:  chest press with 5# kettlebell x20 Ambulation with horizontal head turns in parallel bars without UE support down and back x2  laps   06/13/2024: Nustep level 5 x8  min with PT present to discuss status Marching on airex holding 5# KB x 20 each side Walking around ortho gym for processing  Standing on airex head turns (side to die and up/down) x 20 sec each no UE support 6 inch step taps holding 10# KB x 20 each Sit to stand holding 10# KB 2 x 8 Seated on  purple dynadisk shoulder flexion & scaption 2# x 10 each Seated on purple dynadisk LAQ x 10 bilateral  Sitting on stability ball: march, pelvic tilts, chest press with 5# KB x 20 each exercise Rockerboard (M/Lt & Forwards backwards) x 2 min      PATIENT EDUCATION:  Education details: Spinal precautions; log roll; POC Person educated: Patient Education method: Explanation, Demonstration, and Handouts Education comprehension: verbalized understanding, returned demonstration, and needs further education  HOME EXERCISE PROGRAM: Access Code: [email or web address] URL: [email or web address] Date: 05/03/2024 Prepared by: Jarrell Ashari Llewellyn  Exercises - Supine Transversus Abdominis Bracing - Hands on Stomach  - 1 x daily - 7 x weekly - 2 sets - 10 reps - 4 sec hold - Supine Diaphragmatic Breathing  - 1 x daily - 7 x weekly - 1 sets - 10 reps - Seated Long Arc Quad  - 1 x daily - 7 x weekly - 2 sets - 10 reps - 2 hold - Standing Heel Raise with Support  - 1 x daily - 7 x weekly - 2 sets - 10 reps - Seated Horizontal Smooth Pursuit  - 1 x daily - 7 x weekly - 2 sets - 10 reps - Seated Vertical Smooth Pursuit  - 1 x daily - 7 x weekly - 2 sets - 10 reps - Seated Proximal-Distal Smooth Pursuit  - 1 x daily - 7 x weekly - 2 sets - 10 reps - Seated Gaze Stabilization with Head Rotation  - 1 x daily - 7 x weekly - 2 sets - 30 sec hold - Seated Gaze Stabilization with Head Nod  - 1 x daily - 7 x weekly - 2 sets - 30 sec hold - Seated Scapular Retraction  - 1 x daily - 7 x weekly - 3 sets - 10 reps - Single Leg Balance with Clock Reach  - 1 x daily - 7 x weekly - 1-2 sets  - 10 reps - Forward T with Counter Support  - 1 x daily - 7 x weekly - 1 sets - 10 reps - Forward Monster Walk with Resistance at Ankles and Counter Support  - 1 x daily - 7 x weekly - 2 sets - 10 reps - Side Stepping with Resistance at Ankles and Counter Support  - 1 x daily - 7 x weekly - 2 sets - 10 reps  ASSESSMENT:  CLINICAL IMPRESSION: Ms Michna presents to skilled PT with continued reports that in general, she does not have pain, but occasionally, she gets a quick burst of pain in her right side. Patient states that her dizziness is doing better today.  Patient continues to progress with balance, but required UE support with tandem gait on AirEx beam.  Patient with some pain with pelvic tilts noted today, but very brief in duration.  Patient continues to require skilled PT to progress towards goal related activities.    OBJECTIVE IMPAIRMENTS: Abnormal gait, decreased balance, decreased coordination, difficulty walking, decreased ROM, decreased strength, dizziness, increased muscle spasms, postural dysfunction, and pain.   ACTIVITY LIMITATIONS: carrying, lifting, bending, sitting, standing, squatting, transfers, and bed mobility  PARTICIPATION LIMITATIONS: cleaning, laundry, shopping, community activity, yard work, and church  PERSONAL FACTORS: Age, Fitness, and 1-2 comorbidities: Depression; osteoporosis are also affecting patient's functional outcome.   REHAB POTENTIAL: Good  CLINICAL DECISION MAKING: Evolving/moderate complexity  EVALUATION COMPLEXITY: Moderate   GOALS: Goals reviewed with patient? Yes  SHORT TERM GOALS: Target date: 04/17/2024  Patient will be independent with initial HEP. Baseline:  Goal status: Met on 03/27/24  2.  Patient will report > or = to 40% improvement in functional activity since starting PT. Baseline:  Goal status: Met on 03/27/24  3.  Patient to undergo vestibular assessment by vestibular physical therapist. Baseline:  Goal status: Met on  03/21/2024  4.  Patient will be able to perform STS with no UE support due to improved LE strength. Baseline:  Goal status: Met on 03/27/24   LONG TERM GOALS: Target date: 07/07/2024 Patient will demonstrate independence in advanced HEP to allow for self progression after discharge. Baseline:  Goal status: Ongoing  2.  Patient will demonstrate improved walking tolerance and be able to grocery shop with minimal limitations due to her back pain. Baseline:  Goal status: Ongoing (see above for 6 min walk, still has pain starting at 3.5 min on 05/10/24)  3.  Patient will verbalize and demonstrate self-care strategies to manage pain including tissue mobility practices and change of position. Baseline:  Goal status: Ongoing  3.  Patient will reports > or = 70% improvement in her dizziness since starting PT. Baseline:  Goal status: Met on 04/12/24 (with patient reporting has improved 75% to her baseline)  4.  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  Baseline:  Goal status: Met on 04/21/24  5.  Patient will perform tug in < or = to 17 sec for decreased falls risk.  Baseline: 27.10sec Goal status: Partially Met (met for time, but one slight bobble with 180 degree turn on 04/12/24)    PLAN:  PT FREQUENCY: 1-2x/week  PT DURATION: 8 weeks  PLANNED INTERVENTIONS: 97164- PT Re-evaluation, 97110-Therapeutic exercises, 97530- Therapeutic activity, 97112- Neuromuscular re-education, 97535- Self Care, [id number]- Manual therapy, [phone]- Gait training, [phone]- Orthotic Fit/training, [phone]- Canalith repositioning, [id number]- Aquatic Therapy, [phone]- Electrical stimulation (unattended), 97016- Vasopneumatic device, [id number]- Ultrasound, [id number]- Ionotophoresis 4mg /ml Dexamethasone , Patient/Family education, Balance training, Stair training, Taping, Dry Needling, Joint mobilization, Joint manipulation, Vestibular training, Cryotherapy, and Moist  heat.  PLAN FOR NEXT SESSION:  erector spinae strengthening; continue functional & core strengthening and balance   Jarrell Laming, PT, DPT 06/22/24, 11:17 AM  [facility] [address] [place], [place] [address] Phone # [phone] Fax [phone]

## 2024-06-27 ENCOUNTER — Encounter: Payer: Self-pay | Admitting: Rehabilitative and Restorative Service Providers"

## 2024-06-27 ENCOUNTER — Ambulatory Visit: Admitting: Rehabilitative and Restorative Service Providers"

## 2024-06-27 DIAGNOSIS — M5416 Radiculopathy, lumbar region: Secondary | ICD-10-CM | POA: Diagnosis not present

## 2024-06-27 DIAGNOSIS — R2681 Unsteadiness on feet: Secondary | ICD-10-CM

## 2024-06-27 DIAGNOSIS — M6281 Muscle weakness (generalized): Secondary | ICD-10-CM

## 2024-06-27 DIAGNOSIS — R42 Dizziness and giddiness: Secondary | ICD-10-CM

## 2024-06-27 DIAGNOSIS — R2689 Other abnormalities of gait and mobility: Secondary | ICD-10-CM

## 2024-06-27 NOTE — Therapy (Signed)
 OUTPATIENT PHYSICAL THERAPY TREATMENT NOTE    Patient Name: Lori Frost MRN: [id number] DOB:02-Jul-1946, 78 y.o., female Today's Date: 06/27/2024   END OF SESSION:  PT End of Session - 06/27/24 0926     Visit Number 25    Date for PT Re-Evaluation 07/07/24    Authorization Type HTA    Progress Note Due on Visit 26    PT Start Time 0922    PT Stop Time 1015    PT Time Calculation (min) 53 min    Activity Tolerance Patient tolerated treatment well    Behavior During Therapy WFL for tasks assessed/performed                  Past Medical History:  Diagnosis Date   Allergy    Non-Allergic Rhinitis   Balance problem due to vestibular dysfunction    Bloating    CAD (coronary artery disease)    Carotid artery occlusion    Cataract    Chordae tendineae rupture (HCC)    Cyst of ovary, right    Depression    Diverticulosis    DJD (degenerative joint disease)    Eczema    Family history of adverse reaction to anesthesia    SISTER HEART STOPPED DURING [id number] UNKNOWN CAUSE -  SHE DIED 10 YRS LATER OF HEART ATTACK   GERD (gastroesophageal reflux disease)    ESOPHAGITIS PRESENCE NOT SPECIFIED   Granuloma annulare    History of hiatal hernia    History of recurrent UTI (urinary tract infection)    Hypercholesterolemia    Hyperlipidemia    Lower back pain    Meniere's disease    Migraine    Mitral valve regurgitation    MODERATE NORMAL, MILD AORTIC REGURGITATION, MILD TRICUSPID REGURGITATION, LVH, GRADE 1 DIASTOLIC DYSFUNCTION, ECHO 2018 SUGGESTS RUPTURED CHORDAE TENDINEAE ( DR. BLANCA)   MVP (mitral valve prolapse)    Myalgia    OA (osteoarthritis) of hip 02/12/2016   Osteoporosis    Other nonrheumatic mitral valve disorders    Psoriasis    Pupil dilated    DUE TO EYE DROPS FOR TX OF RT  EYE PROBLEM   Reactive hypoglycemia    Sensitive skin    Shingles    Vertigo    Vestibular neuronitis    Vitamin D deficiency    Past Surgical History:   Procedure Laterality Date   EYE SURGERY Right 02-05-15   Cataract   EYE SURGERY Left 02-21-15   Cataract   TONSILLECTOMY     TOTAL HIP ARTHROPLASTY Right 02/12/2016   Procedure: RIGHT TOTAL HIP ARTHROPLASTY ANTERIOR APPROACH;  Surgeon: Dempsey Moan, MD;  Location: [facility];  Service: Orthopedics;  Laterality: Right;   Patient Active Problem List   Diagnosis Date Noted   S/P lumbar fusion 02/18/2024   Mitral valve regurgitation 08/14/2021   Coronary artery calcification 08/14/2021   Aortic atherosclerosis (HCC) 08/14/2021   Hyperlipidemia 08/14/2021   Carotid artery disease (HCC) 08/14/2021   OA (osteoarthritis) of hip 02/12/2016    PCP: Cleotilde Been, MD  REFERRING PROVIDER: Joshua Alm Hamilton, MD  REFERRING DIAG: M54.16 (ICD-10-CM) - Radiculopathy, lumbar region  Rationale for Evaluation and Treatment: Rehabilitation  THERAPY DIAG:  Radiculopathy, lumbar region  Muscle weakness (generalized)  Unsteadiness on feet  Other abnormalities of gait and mobility  Dizziness and giddiness  ONSET DATE: Surgery Date 02/18/2024  SUBJECTIVE:  SUBJECTIVE STATEMENT: Patient reports that she is having increased pain, states that she notices increased pain with sitting in the chairs at church and band.  PERTINENT HISTORY:  Depression; osteoporosis; vertigo; Meniere's disease; hx Rt total hip arthroplasty ; scoliosis   PAIN:  Are you having pain? Yes: NPRS scale: 5/ 10  Pain location: right hip/leg Pain description: throbbing; burning; sore Aggravating factors: continuous movement; reaching in cabinets; walking in grocery store  Relieving factors: laying down  PRECAUTIONS: Back lumbar fusion 3/7/225. Lifting < 10 lbs  RED FLAGS: None   WEIGHT BEARING RESTRICTIONS: No  FALLS:  Has patient fallen  in last 6 months? No  LIVING ENVIRONMENT: Lives with: lives alone Lives in: House/apartment Stairs: Yes: Internal: 1  steps; none Has following equipment at home: Single point cane  OCCUPATION: Retired  PLOF: Independent, Independent with basic ADLs, Independent with household mobility without device, and Independent with gait  PATIENT GOALS: Return to activities she does at her church; Return to [employer]; shop without issues;   NEXT MD VISIT: July 2025 with Dr Joshua  OBJECTIVE:  Note: Objective measures were completed at Evaluation unless otherwise noted.  DIAGNOSTIC FINDINGS:  12/29/2023 Lumbar CT without contrast  IMPRESSION: 1. Transitional lumbosacral anatomy. Designating normal lumbar segmentation results in hypoplastic ribs at L1. Correlation with radiographs is recommended prior to any operative intervention. 2. Levoconvex lumbar scoliosis with degenerative spondylolisthesis at L2-L3, L5-S1. Advanced endplate degeneration at the former, facet arthropathy at the latter. 3. No convincing spinal stenosis by CT. But moderate right lateral recess and severe right neural foraminal stenosis at L2-L3. Mild left lateral recess stenosis at L3-L4. 4.  Aortic Atherosclerosis (ICD10-I70.0).   PATIENT SURVEYS:  Eval:  Modified Oswestry 17/15 34%  05/03/2024:  Modified Oswestry Low Back Pain Disability Questionnaire: 17 / 50 = 34.0 % 06/22/2024:  Modified Oswestry Low Back Pain Disability Questionnaire: 14 / 50 = 28.0 %  COGNITION: Overall cognitive status: Within functional limits for tasks assessed     SENSATION: WFL   POSTURE: rounded shoulders  SKIN: Incision healing well; no warmth noted  LUMBAR ROM: NT due to post surgery   LOWER EXTREMITY ROM:   WFL   LOWER EXTREMITY MMT:  Grossly 4-/5   FUNCTIONAL TESTS:  Eval: 5 times sit to stand: 24.35 sec w. UE support Timed up and go (TUG): 27.10 sec with str cane. Pt experienced some dizziness after  completing turn  03/21/2024: Modified CTSIB:  Condition 1- 30 sec (but self corrected loss of balance at 20 sec), Condition 2- 5 sec, Condition 3- 30 sec (with increased sway), Condition 4- 2 sec.  Total- 67/120    03/23/2024 6 MWT with str cane:680ft- increased anterior Lt hip pain at 2 minute mark. Patient frequently touches walls when ambulating. She verbalized dizziness when people were walking around her.    04/12/2024: 5 times sit to stand: 11.77 sec with pain of 1/10 it's more of a throb, not sharp pain. Timed up and go (TUG): 12.69 sec without assistive device (with slight balance check with 180 degree turn)  04/19/2024: 5 times sit to stand: 10.99 sec Timed up and go (TUG): 10.14 sec with some slight self corrected balance checks Modified CTSIB:  Condition 1- 30 sec (very slight balance adjustments), Condition 2- 17 sec, Condition 3- 30 sec (with increased sway), Condition 4- 5 sec.  Total- 82/120  05/10/2024: 6 minute walk test:  1005 ft with pain in bilateral hips pain 3/10 starting at 3.5 min  mark.  Only had to touch wall once for balance. Modified CTSIB:  Condition 1- 30 sec, Condition 2- 15 sec, Condition 3- 30 sec (with increased sway), Condition 4-  20 sec.  Total- 95/120  GAIT: Distance walked: 12ft Assistive device utilized: Single point cane Level of assistance: Complete Independence Comments: decreased step length; patient experiences mild dizziness when turning  VESTIBULAR ASSESSMENT: 03/21/2024: Smooth pursuit:  nystagmus noted when looking to the right and up.  No nystagmus looking left Saccades:  WNL Gaze stabilization is WNL Head Impulse Test:  positive bilaterally, worse on right than left [place] negative bilaterally   TREATMENT DATE:  06/27/2024: Nustep level 5 x7 min with PT present to discuss status Seated hamstring stretch 2x20 sec bilat Seated hip adduction ball squeeze with 5 sec hold 2x10 Seated transversus abdominus contraction with pushing  ball into thighs 2x10 Supine hip abduction clamshells with yellow loop 2x10 Hooklying transversus abdominus contractions 2x10 Hooklying marching with yellow loop 2x10 Supine straight leg raise x8 bilat Supine short arc quad with 2# ankle weights 2x10 bilat Manual Therapy: Soft tissue mobilization to right hip and IT band region with increased soreness noted.  Patient with tenderness noted, so started to utilize the myofascial roller to allow for increased fascia mobility.  Noted increased tenderness especially noted over right great trochanter area.  Also performed on left IT band region with less tenderness/pain noted.   06/22/2024: Nustep level 5 x6 min with PT present to discuss status FWD and backwards tandem gait on AirEx beam x3 laps with with UE support on parallel bars Side stepping on on AirEx beam x3 laps down and back bilat Marching on airex holding 5# kettlebell x 20 each side Standing on airex head turns (side to die and up/down) 2 x 20 sec each no UE support FWD step ups with unilateral hand on rail and other hand holding 8# dumbbell x10 bilat Sit to stand holding 8# dumbbell 2 x 8 Sit to stand on foam pad 2x10 Seated on dynadisk shoulder flexion & scaption 2# 2 x 10 each Seated on dynadisk LAQ 2 x 10 bilateral  Seated on dynadisk:  4 way pelvic tilts x10 each   06/20/2024: Nustep level 5 x6 min with PT present to discuss status Marching on airex holding 5# kettlebell x 20 each side Standing on airex head turns (side to die and up/down) 2 x 20 sec each no UE support 6 inch step taps holding 8# dumb bell x 20 each FWD step ups with unilateral hand on rail and other hand holding 8# dumbbell x10 bilat Sit to stand holding 8# dumbbell 2 x 8 Sit to stand on foam pad 2x10 Seated on dynadisk shoulder flexion & scaption 2# 2 x 10 each Seated on dynadisk LAQ 2 x 10 bilateral  Seated on dynadisk:  4 way pelvic tilts x10 each Seated on dynadisk:  chest press with 3# dumbbells in  bilat hands x20 Single leg on blue pod performing clock taps x10 bilat Ambulation with horizontal head turns in parallel bars without UE support down and back x2 laps   PATIENT EDUCATION:  Education details: Spinal precautions; log roll; POC Person educated: Patient Education method: Explanation, Demonstration, and Handouts Education comprehension: verbalized understanding, returned demonstration, and needs further education  HOME EXERCISE PROGRAM: Access Code: [email or web address] URL: [email or web address] Date: 05/03/2024 Prepared by: Jarrell Javaria Knapke  Exercises - Supine Transversus Abdominis Bracing - Hands on Stomach  - 1 x daily - 7 x weekly - 2 sets -  10 reps - 4 sec hold - Supine Diaphragmatic Breathing  - 1 x daily - 7 x weekly - 1 sets - 10 reps - Seated Long Arc Quad  - 1 x daily - 7 x weekly - 2 sets - 10 reps - 2 hold - Standing Heel Raise with Support  - 1 x daily - 7 x weekly - 2 sets - 10 reps - Seated Horizontal Smooth Pursuit  - 1 x daily - 7 x weekly - 2 sets - 10 reps - Seated Vertical Smooth Pursuit  - 1 x daily - 7 x weekly - 2 sets - 10 reps - Seated Proximal-Distal Smooth Pursuit  - 1 x daily - 7 x weekly - 2 sets - 10 reps - Seated Gaze Stabilization with Head Rotation  - 1 x daily - 7 x weekly - 2 sets - 30 sec hold - Seated Gaze Stabilization with Head Nod  - 1 x daily - 7 x weekly - 2 sets - 30 sec hold - Seated Scapular Retraction  - 1 x daily - 7 x weekly - 3 sets - 10 reps - Single Leg Balance with Clock Reach  - 1 x daily - 7 x weekly - 1-2 sets - 10 reps - Forward T with Counter Support  - 1 x daily - 7 x weekly - 1 sets - 10 reps - Forward Monster Walk with Resistance at Ankles and Counter Support  - 1 x daily - 7 x weekly - 2 sets - 10 reps - Side Stepping with Resistance at Ankles and Counter Support  - 1 x daily - 7 x weekly - 2 sets - 10 reps  ASSESSMENT:  CLINICAL IMPRESSION: Lori Frost presents to skilled PT reporting that she has been  noticing increased pain, especially in her right hip region.  Patient noted to have increased antalgic gait pattern upon arrival.  Patient able to perform session in more of a seated and supine capacity secondary to pain.  Patient limited to 8 straight leg raises secondary to increased pain.  Performed soft tissue mobilization manually at first and noted increased pain, so switched to myofascial roller and patient with some sensitivity, but able to tolerate the roller better than manual (she state that this pain did remind her of when she had bursitis in the past).  Patient did report decreased pain following soft tissue mobilization.  Patient to follow up with her surgeon later this week and she is going to speak with him about her increased right hip pain.    OBJECTIVE IMPAIRMENTS: Abnormal gait, decreased balance, decreased coordination, difficulty walking, decreased ROM, decreased strength, dizziness, increased muscle spasms, postural dysfunction, and pain.   ACTIVITY LIMITATIONS: carrying, lifting, bending, sitting, standing, squatting, transfers, and bed mobility  PARTICIPATION LIMITATIONS: cleaning, laundry, shopping, community activity, yard work, and church  PERSONAL FACTORS: Age, Fitness, and 1-2 comorbidities: Depression; osteoporosis are also affecting patient's functional outcome.   REHAB POTENTIAL: Good  CLINICAL DECISION MAKING: Evolving/moderate complexity  EVALUATION COMPLEXITY: Moderate   GOALS: Goals reviewed with patient? Yes  SHORT TERM GOALS: Target date: 04/17/2024  Patient will be independent with initial HEP. Baseline:  Goal status: Met on 03/27/24  2.  Patient will report > or = to 40% improvement in functional activity since starting PT. Baseline:  Goal status: Met on 03/27/24  3.  Patient to undergo vestibular assessment by vestibular physical therapist. Baseline:  Goal status: Met on 03/21/2024  4.  Patient will be able to  perform STS with no UE support due  to improved LE strength. Baseline:  Goal status: Met on 03/27/24   LONG TERM GOALS: Target date: 07/07/2024 Patient will demonstrate independence in advanced HEP to allow for self progression after discharge. Baseline:  Goal status: Ongoing  2.  Patient will demonstrate improved walking tolerance and be able to grocery shop with minimal limitations due to her back pain. Baseline:  Goal status: Ongoing (see above for 6 min walk, still has pain starting at 3.5 min on 05/10/24)  3.  Patient will verbalize and demonstrate self-care strategies to manage pain including tissue mobility practices and change of position. Baseline:  Goal status: Ongoing  3.  Patient will reports > or = 70% improvement in her dizziness since starting PT. Baseline:  Goal status: Met on 04/12/24 (with patient reporting has improved 75% to her baseline)  4.  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  Baseline:  Goal status: Met on 04/21/24  5.  Patient will perform tug in < or = to 17 sec for decreased falls risk.  Baseline: 27.10sec Goal status: Partially Met (met for time, but one slight bobble with 180 degree turn on 04/12/24)    PLAN:  PT FREQUENCY: 1-2x/week  PT DURATION: 8 weeks  PLANNED INTERVENTIONS: 97164- PT Re-evaluation, 97110-Therapeutic exercises, 97530- Therapeutic activity, 97112- Neuromuscular re-education, 97535- Self Care, [id number]- Manual therapy, [phone]- Gait training, [phone]- Orthotic Fit/training, [phone]- Canalith repositioning, [id number]- Aquatic Therapy, [phone]- Electrical stimulation (unattended), 97016- Vasopneumatic device, [id number]- Ultrasound, [id number]- Ionotophoresis 4mg /ml Dexamethasone , Patient/Family education, Balance training, Stair training, Taping, Dry Needling, Joint mobilization, Joint manipulation, Vestibular training, Cryotherapy, and Moist heat.  PLAN FOR NEXT SESSION:  erector spinae strengthening; continue functional &  core strengthening and balance   Jarrell Laming, PT, DPT 06/27/24, 10:41 AM  [facility] [address] [place], [place] [address] Phone # [phone] Fax [phone]

## 2024-06-29 DIAGNOSIS — M7061 Trochanteric bursitis, right hip: Secondary | ICD-10-CM | POA: Diagnosis not present

## 2024-06-29 DIAGNOSIS — M546 Pain in thoracic spine: Secondary | ICD-10-CM | POA: Diagnosis not present

## 2024-06-29 DIAGNOSIS — M5416 Radiculopathy, lumbar region: Secondary | ICD-10-CM | POA: Diagnosis not present

## 2024-06-30 ENCOUNTER — Ambulatory Visit: Admitting: Physical Therapy

## 2024-06-30 ENCOUNTER — Encounter: Payer: Self-pay | Admitting: Physical Therapy

## 2024-06-30 DIAGNOSIS — M5416 Radiculopathy, lumbar region: Secondary | ICD-10-CM | POA: Diagnosis not present

## 2024-06-30 DIAGNOSIS — R2681 Unsteadiness on feet: Secondary | ICD-10-CM

## 2024-06-30 DIAGNOSIS — M6281 Muscle weakness (generalized): Secondary | ICD-10-CM

## 2024-06-30 DIAGNOSIS — R2689 Other abnormalities of gait and mobility: Secondary | ICD-10-CM

## 2024-06-30 DIAGNOSIS — R42 Dizziness and giddiness: Secondary | ICD-10-CM

## 2024-06-30 NOTE — Therapy (Signed)
 OUTPATIENT PHYSICAL THERAPY TREATMENT NOTE  Progress Note Reporting Period 05/10/2024 to 06/30/2024  See note below for Objective Data and Assessment of Progress/Goals.      Patient Name: Lori Frost MRN: [id number] DOB:January 11, 1946, 78 y.o., female Today's Date: 06/30/2024   END OF SESSION:  PT End of Session - 06/30/24 1025     Visit Number 26    Date for PT Re-Evaluation 07/07/24    Authorization Type HTA    Progress Note Due on Visit 36    PT Start Time 0930    PT Stop Time 1010    PT Time Calculation (min) 40 min    Activity Tolerance Patient tolerated treatment well    Behavior During Therapy WFL for tasks assessed/performed                   Past Medical History:  Diagnosis Date   Allergy    Non-Allergic Rhinitis   Balance problem due to vestibular dysfunction    Bloating    CAD (coronary artery disease)    Carotid artery occlusion    Cataract    Chordae tendineae rupture (HCC)    Cyst of ovary, right    Depression    Diverticulosis    DJD (degenerative joint disease)    Eczema    Family history of adverse reaction to anesthesia    SISTER HEART STOPPED DURING [id number] UNKNOWN CAUSE -  SHE DIED 10 YRS LATER OF HEART ATTACK   GERD (gastroesophageal reflux disease)    ESOPHAGITIS PRESENCE NOT SPECIFIED   Granuloma annulare    History of hiatal hernia    History of recurrent UTI (urinary tract infection)    Hypercholesterolemia    Hyperlipidemia    Lower back pain    Meniere's disease    Migraine    Mitral valve regurgitation    MODERATE NORMAL, MILD AORTIC REGURGITATION, MILD TRICUSPID REGURGITATION, LVH, GRADE 1 DIASTOLIC DYSFUNCTION, ECHO 2018 SUGGESTS RUPTURED CHORDAE TENDINEAE ( DR. BLANCA)   MVP (mitral valve prolapse)    Myalgia    OA (osteoarthritis) of hip 02/12/2016   Osteoporosis    Other nonrheumatic mitral valve disorders    Psoriasis    Pupil dilated    DUE TO EYE DROPS FOR TX OF RT  EYE PROBLEM   Reactive  hypoglycemia    Sensitive skin    Shingles    Vertigo    Vestibular neuronitis    Vitamin D deficiency    Past Surgical History:  Procedure Laterality Date   EYE SURGERY Right 02-05-15   Cataract   EYE SURGERY Left 02-21-15   Cataract   TONSILLECTOMY     TOTAL HIP ARTHROPLASTY Right 02/12/2016   Procedure: RIGHT TOTAL HIP ARTHROPLASTY ANTERIOR APPROACH;  Surgeon: Dempsey Moan, MD;  Location: [facility];  Service: Orthopedics;  Laterality: Right;   Patient Active Problem List   Diagnosis Date Noted   S/P lumbar fusion 02/18/2024   Mitral valve regurgitation 08/14/2021   Coronary artery calcification 08/14/2021   Aortic atherosclerosis (HCC) 08/14/2021   Hyperlipidemia 08/14/2021   Carotid artery disease (HCC) 08/14/2021   OA (osteoarthritis) of hip 02/12/2016    PCP: Cleotilde Been, MD  REFERRING PROVIDER: Joshua Alm Hamilton, MD  REFERRING DIAG: M54.16 (ICD-10-CM) - Radiculopathy, lumbar region  Rationale for Evaluation and Treatment: Rehabilitation  THERAPY DIAG:  Radiculopathy, lumbar region  Muscle weakness (generalized)  Unsteadiness on feet  Other abnormalities of gait and mobility  Dizziness and giddiness  ONSET DATE: Surgery Date 02/18/2024  SUBJECTIVE:                                                                                                                                                                                           SUBJECTIVE STATEMENT: My balance is not good today. I got a shot in my right hip yesterday for my hip bursitis.   PERTINENT HISTORY:  Depression; osteoporosis; vertigo; Meniere's disease; hx Rt total hip arthroplasty ; scoliosis   PAIN:  Are you having pain? Yes: NPRS scale: 5/ 10  Pain location: right hip/leg Pain description: throbbing; burning; sore Aggravating factors: continuous movement; reaching in cabinets; walking in grocery store  Relieving factors: laying down  PRECAUTIONS: Back lumbar fusion 3/7/225. Lifting < 10  lbs  RED FLAGS: None   WEIGHT BEARING RESTRICTIONS: No  FALLS:  Has patient fallen in last 6 months? No  LIVING ENVIRONMENT: Lives with: lives alone Lives in: House/apartment Stairs: Yes: Internal: 1  steps; none Has following equipment at home: Single point cane  OCCUPATION: Retired  PLOF: Independent, Independent with basic ADLs, Independent with household mobility without device, and Independent with gait  PATIENT GOALS: Return to activities she does at her church; Return to [employer]; shop without issues;   NEXT MD VISIT: July 2025 with Dr Joshua  OBJECTIVE:  Note: Objective measures were completed at Evaluation unless otherwise noted.  DIAGNOSTIC FINDINGS:  12/29/2023 Lumbar CT without contrast  IMPRESSION: 1. Transitional lumbosacral anatomy. Designating normal lumbar segmentation results in hypoplastic ribs at L1. Correlation with radiographs is recommended prior to any operative intervention. 2. Levoconvex lumbar scoliosis with degenerative spondylolisthesis at L2-L3, L5-S1. Advanced endplate degeneration at the former, facet arthropathy at the latter. 3. No convincing spinal stenosis by CT. But moderate right lateral recess and severe right neural foraminal stenosis at L2-L3. Mild left lateral recess stenosis at L3-L4. 4.  Aortic Atherosclerosis (ICD10-I70.0).   PATIENT SURVEYS:  Eval:  Modified Oswestry 17/15 34%  05/03/2024:  Modified Oswestry Low Back Pain Disability Questionnaire: 17 / 50 = 34.0 % 06/22/2024:  Modified Oswestry Low Back Pain Disability Questionnaire: 14 / 50 = 28.0 %  COGNITION: Overall cognitive status: Within functional limits for tasks assessed     SENSATION: WFL   POSTURE: rounded shoulders  SKIN: Incision healing well; no warmth noted  LUMBAR ROM: NT due to post surgery   LOWER EXTREMITY ROM:   WFL   LOWER EXTREMITY MMT:  Grossly 4-/5   FUNCTIONAL TESTS:  Eval: 5 times sit to stand: 24.35 sec w. UE  support Timed up and go (TUG): 27.10 sec with str cane. Pt experienced some dizziness after  completing turn  03/21/2024: Modified CTSIB:  Condition 1- 30 sec (but self corrected loss of balance at 20 sec), Condition 2- 5 sec, Condition 3- 30 sec (with increased sway), Condition 4- 2 sec.  Total- 67/120    03/23/2024 6 MWT with str cane:680ft- increased anterior Lt hip pain at 2 minute mark. Patient frequently touches walls when ambulating. She verbalized dizziness when people were walking around her.    04/12/2024: 5 times sit to stand: 11.77 sec with pain of 1/10 it's more of a throb, not sharp pain. Timed up and go (TUG): 12.69 sec without assistive device (with slight balance check with 180 degree turn)  04/19/2024: 5 times sit to stand: 10.99 sec Timed up and go (TUG): 10.14 sec with some slight self corrected balance checks Modified CTSIB:  Condition 1- 30 sec (very slight balance adjustments), Condition 2- 17 sec, Condition 3- 30 sec (with increased sway), Condition 4- 5 sec.  Total- 82/120  05/10/2024: 6 minute walk test:  1005 ft with pain in bilateral hips pain 3/10 starting at 3.5 min mark.  Only had to touch wall once for balance. Modified CTSIB:  Condition 1- 30 sec, Condition 2- 15 sec, Condition 3- 30 sec (with increased sway), Condition 4-  20 sec.  Total- 95/120  GAIT: Distance walked: 57ft Assistive device utilized: Single point cane Level of assistance: Complete Independence Comments: decreased step length; patient experiences mild dizziness when turning  VESTIBULAR ASSESSMENT: 03/21/2024: Smooth pursuit:  nystagmus noted when looking to the right and up.  No nystagmus looking left Saccades:  WNL Gaze stabilization is WNL Head Impulse Test:  positive bilaterally, worse on right than left [place] negative bilaterally   TREATMENT DATE:  06/30/2024: No NuStep today due to patient getting an hip injection yesterday Weight shifts on airex (M/L and staggered  stance) x 2 mins each Rocker board (m/l and forwards/backwards) x 2 mins each no UE support Seated transversus abdominus contraction with pushing ball into thighs 2x10 Seated hip adduction ball squeeze + TA activation x 20 Seated LAQ 2 x 10 bilateral 2# AW only on left Sit to stand 2 x 10 Tandem walking on airex beam (forwards & backwards then side stepping) x 3 laps each Standing heel raise on airex 2 x 10 Marching on BOSU (blue side up) x 20 Mini squats on BOSU (blue side up) x 10     06/27/2024: Nustep level 5 x7 min with PT present to discuss status Seated hamstring stretch 2x20 sec bilat Seated hip adduction ball squeeze with 5 sec hold 2x10 Seated transversus abdominus contraction with pushing ball into thighs 2x10 Supine hip abduction clamshells with yellow loop 2x10 Hooklying transversus abdominus contractions 2x10 Hooklying marching with yellow loop 2x10 Supine straight leg raise x8 bilat Supine short arc quad with 2# ankle weights 2x10 bilat Manual Therapy: Soft tissue mobilization to right hip and IT band region with increased soreness noted.  Patient with tenderness noted, so started to utilize the myofascial roller to allow for increased fascia mobility.  Noted increased tenderness especially noted over right great trochanter area.  Also performed on left IT band region with less tenderness/pain noted.   06/22/2024: Nustep level 5 x6 min with PT present to discuss status FWD and backwards tandem gait on AirEx beam x3 laps with with UE support on parallel bars Side stepping on on AirEx beam x3 laps down and back bilat Marching on airex holding 5# kettlebell x 20 each side Standing on airex head turns (  side to die and up/down) 2 x 20 sec each no UE support FWD step ups with unilateral hand on rail and other hand holding 8# dumbbell x10 bilat Sit to stand holding 8# dumbbell 2 x 8 Sit to stand on foam pad 2x10 Seated on dynadisk shoulder flexion & scaption 2# 2 x 10  each Seated on dynadisk LAQ 2 x 10 bilateral  Seated on dynadisk:  4 way pelvic tilts x10 each   06/20/2024: Nustep level 5 x6 min with PT present to discuss status Marching on airex holding 5# kettlebell x 20 each side Standing on airex head turns (side to die and up/down) 2 x 20 sec each no UE support 6 inch step taps holding 8# dumb bell x 20 each FWD step ups with unilateral hand on rail and other hand holding 8# dumbbell x10 bilat Sit to stand holding 8# dumbbell 2 x 8 Sit to stand on foam pad 2x10 Seated on dynadisk shoulder flexion & scaption 2# 2 x 10 each Seated on dynadisk LAQ 2 x 10 bilateral  Seated on dynadisk:  4 way pelvic tilts x10 each Seated on dynadisk:  chest press with 3# dumbbells in bilat hands x20 Single leg on blue pod performing clock taps x10 bilat Ambulation with horizontal head turns in parallel bars without UE support down and back x2 laps   PATIENT EDUCATION:  Education details: Spinal precautions; log roll; POC Person educated: Patient Education method: Explanation, Demonstration, and Handouts Education comprehension: verbalized understanding, returned demonstration, and needs further education  HOME EXERCISE PROGRAM: Access Code: [email or web address] URL: [email or web address] Date: 05/03/2024 Prepared by: Jarrell Menke  Exercises - Supine Transversus Abdominis Bracing - Hands on Stomach  - 1 x daily - 7 x weekly - 2 sets - 10 reps - 4 sec hold - Supine Diaphragmatic Breathing  - 1 x daily - 7 x weekly - 1 sets - 10 reps - Seated Long Arc Quad  - 1 x daily - 7 x weekly - 2 sets - 10 reps - 2 hold - Standing Heel Raise with Support  - 1 x daily - 7 x weekly - 2 sets - 10 reps - Seated Horizontal Smooth Pursuit  - 1 x daily - 7 x weekly - 2 sets - 10 reps - Seated Vertical Smooth Pursuit  - 1 x daily - 7 x weekly - 2 sets - 10 reps - Seated Proximal-Distal Smooth Pursuit  - 1 x daily - 7 x weekly - 2 sets - 10 reps - Seated Gaze Stabilization  with Head Rotation  - 1 x daily - 7 x weekly - 2 sets - 30 sec hold - Seated Gaze Stabilization with Head Nod  - 1 x daily - 7 x weekly - 2 sets - 30 sec hold - Seated Scapular Retraction  - 1 x daily - 7 x weekly - 3 sets - 10 reps - Single Leg Balance with Clock Reach  - 1 x daily - 7 x weekly - 1-2 sets - 10 reps - Forward T with Counter Support  - 1 x daily - 7 x weekly - 1 sets - 10 reps - Forward Monster Walk with Resistance at Ankles and Counter Support  - 1 x daily - 7 x weekly - 2 sets - 10 reps - Side Stepping with Resistance at Ankles and Counter Support  - 1 x daily - 7 x weekly - 2 sets - 10 reps  ASSESSMENT:  CLINICAL IMPRESSION: Ms Plant presents to  therapy with some right hip tenderness. She got a steroid shot yesterday in her right hip. Lower intensity exercises were performed today due to this. Patient also verbalized feeling like her balance was off today. Today's treatment session focused on balance on unstable surface and gentle strengthening. She was able to perform exercises with minimal UE support. Overall, she tolerated treatment session well. She is progressing appropriately with physical therapy. Patient will benefit from skilled PT to address the below impairments and improve overall function.     OBJECTIVE IMPAIRMENTS: Abnormal gait, decreased balance, decreased coordination, difficulty walking, decreased ROM, decreased strength, dizziness, increased muscle spasms, postural dysfunction, and pain.   ACTIVITY LIMITATIONS: carrying, lifting, bending, sitting, standing, squatting, transfers, and bed mobility  PARTICIPATION LIMITATIONS: cleaning, laundry, shopping, community activity, yard work, and church  PERSONAL FACTORS: Age, Fitness, and 1-2 comorbidities: Depression; osteoporosis are also affecting patient's functional outcome.   REHAB POTENTIAL: Good  CLINICAL DECISION MAKING: Evolving/moderate complexity  EVALUATION COMPLEXITY: Moderate   GOALS: Goals  reviewed with patient? Yes  SHORT TERM GOALS: Target date: 04/17/2024  Patient will be independent with initial HEP. Baseline:  Goal status: Met on 03/27/24  2.  Patient will report > or = to 40% improvement in functional activity since starting PT. Baseline:  Goal status: Met on 03/27/24  3.  Patient to undergo vestibular assessment by vestibular physical therapist. Baseline:  Goal status: Met on 03/21/2024  4.  Patient will be able to perform STS with no UE support due to improved LE strength. Baseline:  Goal status: Met on 03/27/24   LONG TERM GOALS: Target date: 07/07/2024 Patient will demonstrate independence in advanced HEP to allow for self progression after discharge. Baseline:  Goal status: Ongoing  2.  Patient will demonstrate improved walking tolerance and be able to grocery shop with minimal limitations due to her back pain. Baseline:  Goal status: Ongoing (see above for 6 min walk, still has pain starting at 3.5 min on 05/10/24)  3.  Patient will verbalize and demonstrate self-care strategies to manage pain including tissue mobility practices and change of position. Baseline:  Goal status: Ongoing  3.  Patient will reports > or = 70% improvement in her dizziness since starting PT. Baseline:  Goal status: Met on 04/12/24 (with patient reporting has improved 75% to her baseline)  4.  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  Baseline:  Goal status: Met on 04/21/24  5.  Patient will perform tug in < or = to 17 sec for decreased falls risk.  Baseline: 27.10sec Goal status: Partially Met (met for time, but one slight bobble with 180 degree turn on 04/12/24)    PLAN:  PT FREQUENCY: 1-2x/week  PT DURATION: 8 weeks  PLANNED INTERVENTIONS: 97164- PT Re-evaluation, 97110-Therapeutic exercises, 97530- Therapeutic activity, 97112- Neuromuscular re-education, 97535- Self Care, [id number]- Manual therapy, [phone]-  Gait training, [phone]- Orthotic Fit/training, [phone]- Canalith repositioning, [id number]- Aquatic Therapy, [phone]- Electrical stimulation (unattended), 97016- Vasopneumatic device, [id number]- Ultrasound, [id number]- Ionotophoresis 4mg /ml Dexamethasone , Patient/Family education, Balance training, Stair training, Taping, Dry Needling, Joint mobilization, Joint manipulation, Vestibular training, Cryotherapy, and Moist heat.  PLAN FOR NEXT SESSION: discuss POC (patient is unsure right now what she wants to do) erector spinae strengthening; continue functional & core strengthening and balance    Kristeen Sar, PT 06/30/24 10:31 AM [facility] [address], [address] [place], [place] [address] Phone # [phone] Fax [phone]

## 2024-07-04 ENCOUNTER — Encounter: Payer: Self-pay | Admitting: Rehabilitative and Restorative Service Providers"

## 2024-07-04 ENCOUNTER — Ambulatory Visit: Admitting: Rehabilitative and Restorative Service Providers"

## 2024-07-04 DIAGNOSIS — M5125 Other intervertebral disc displacement, thoracolumbar region: Secondary | ICD-10-CM | POA: Diagnosis not present

## 2024-07-04 DIAGNOSIS — R42 Dizziness and giddiness: Secondary | ICD-10-CM

## 2024-07-04 DIAGNOSIS — M4804 Spinal stenosis, thoracic region: Secondary | ICD-10-CM | POA: Diagnosis not present

## 2024-07-04 DIAGNOSIS — M6281 Muscle weakness (generalized): Secondary | ICD-10-CM

## 2024-07-04 DIAGNOSIS — M5416 Radiculopathy, lumbar region: Secondary | ICD-10-CM | POA: Diagnosis not present

## 2024-07-04 DIAGNOSIS — M546 Pain in thoracic spine: Secondary | ICD-10-CM | POA: Diagnosis not present

## 2024-07-04 DIAGNOSIS — M47814 Spondylosis without myelopathy or radiculopathy, thoracic region: Secondary | ICD-10-CM | POA: Diagnosis not present

## 2024-07-04 DIAGNOSIS — R2681 Unsteadiness on feet: Secondary | ICD-10-CM

## 2024-07-04 DIAGNOSIS — R2689 Other abnormalities of gait and mobility: Secondary | ICD-10-CM

## 2024-07-04 DIAGNOSIS — M4187 Other forms of scoliosis, lumbosacral region: Secondary | ICD-10-CM | POA: Diagnosis not present

## 2024-07-04 NOTE — Therapy (Signed)
 OUTPATIENT PHYSICAL THERAPY TREATMENT NOTE      Patient Name: Lori Frost MRN: [id number] DOB:[birth year], 78 y.o., female Today's Date: 07/04/2024   END OF SESSION:  PT End of Session - 07/04/24 1033     Visit Number 27    Date for PT Re-Evaluation 07/07/24    Authorization Type HTA    Progress Note Due on Visit 36    PT Start Time 0930    PT Stop Time 1013    PT Time Calculation (min) 43 min    Activity Tolerance Patient tolerated treatment well    Behavior During Therapy WFL for tasks assessed/performed            Past Medical History:  Diagnosis Date   Allergy    Non-Allergic Rhinitis   Balance problem due to vestibular dysfunction    Bloating    CAD (coronary artery disease)    Carotid artery occlusion    Cataract    Chordae tendineae rupture (HCC)    Cyst of ovary, right    Depression    Diverticulosis    DJD (degenerative joint disease)    Eczema    Family history of adverse reaction to anesthesia    SISTER HEART STOPPED DURING [id number] UNKNOWN CAUSE -  SHE DIED 10 YRS LATER OF HEART ATTACK   GERD (gastroesophageal reflux disease)    ESOPHAGITIS PRESENCE NOT SPECIFIED   Granuloma annulare    History of hiatal hernia    History of recurrent UTI (urinary tract infection)    Hypercholesterolemia    Hyperlipidemia    Lower back pain    Meniere's disease    Migraine    Mitral valve regurgitation    MODERATE NORMAL, MILD AORTIC REGURGITATION, MILD TRICUSPID REGURGITATION, LVH, GRADE 1 DIASTOLIC DYSFUNCTION, ECHO 2018 SUGGESTS RUPTURED CHORDAE TENDINEAE ( DR. BLANCA)   MVP (mitral valve prolapse)    Myalgia    OA (osteoarthritis) of hip 02/12/2016   Osteoporosis    Other nonrheumatic mitral valve disorders    Psoriasis    Pupil dilated    DUE TO EYE DROPS FOR TX OF RT  EYE PROBLEM   Reactive hypoglycemia    Sensitive skin    Shingles    Vertigo    Vestibular neuronitis    Vitamin D deficiency    Past Surgical History:  Procedure  Laterality Date   EYE SURGERY Right 02-05-15   Cataract   EYE SURGERY Left 02-21-15   Cataract   TONSILLECTOMY     TOTAL HIP ARTHROPLASTY Right 02/12/2016   Procedure: RIGHT TOTAL HIP ARTHROPLASTY ANTERIOR APPROACH;  Surgeon: Dempsey Moan, MD;  Location: [facility];  Service: Orthopedics;  Laterality: Right;   Patient Active Problem List   Diagnosis Date Noted   S/P lumbar fusion 02/18/2024   Mitral valve regurgitation 08/14/2021   Coronary artery calcification 08/14/2021   Aortic atherosclerosis (HCC) 08/14/2021   Hyperlipidemia 08/14/2021   Carotid artery disease (HCC) 08/14/2021   OA (osteoarthritis) of hip 02/12/2016    PCP: Cleotilde Been, MD  REFERRING PROVIDER: Joshua Alm Hamilton, MD  REFERRING DIAG: M54.16 (ICD-10-CM) - Radiculopathy, lumbar region  Rationale for Evaluation and Treatment: Rehabilitation  THERAPY DIAG:  Radiculopathy, lumbar region  Muscle weakness (generalized)  Unsteadiness on feet  Other abnormalities of gait and mobility  Dizziness and giddiness  ONSET DATE: Surgery Date 02/18/2024  SUBJECTIVE:  SUBJECTIVE STATEMENT:  Pt reports PT has helped her improve her balance overall, however pt is still in 5/10 pain on the right side of her body. She requested no heavy lifting today with the right arm.   PERTINENT HISTORY:  Depression; osteoporosis; vertigo; Meniere's disease; hx Rt total hip arthroplasty ; scoliosis   PAIN:  Are you having pain? Yes: NPRS scale: currently 5/ 10  Pain location: right side of body Pain description: throbbing; burning; sore Aggravating factors: continuous movement; reaching in cabinets; walking in grocery store  Relieving factors: laying down  PRECAUTIONS: Back lumbar fusion 3/7/225. Lifting < 10 lbs  RED FLAGS: None   WEIGHT BEARING  RESTRICTIONS: No  FALLS:  Has patient fallen in last 6 months? No  LIVING ENVIRONMENT: Lives with: lives alone Lives in: House/apartment Stairs: Yes: Internal: 1  steps; none Has following equipment at home: Single point cane  OCCUPATION: Retired  PLOF: Independent, Independent with basic ADLs, Independent with household mobility without device, and Independent with gait  PATIENT GOALS: Return to activities she does at her church; Return to [employer]; shop without issues;   NEXT MD VISIT: July 2025 with Dr Joshua  OBJECTIVE:  Note: Objective measures were completed at Evaluation unless otherwise noted.  DIAGNOSTIC FINDINGS:  12/29/2023 Lumbar CT without contrast  IMPRESSION: 1. Transitional lumbosacral anatomy. Designating normal lumbar segmentation results in hypoplastic ribs at L1. Correlation with radiographs is recommended prior to any operative intervention. 2. Levoconvex lumbar scoliosis with degenerative spondylolisthesis at L2-L3, L5-S1. Advanced endplate degeneration at the former, facet arthropathy at the latter. 3. No convincing spinal stenosis by CT. But moderate right lateral recess and severe right neural foraminal stenosis at L2-L3. Mild left lateral recess stenosis at L3-L4. 4.  Aortic Atherosclerosis (ICD10-I70.0).   PATIENT SURVEYS:  Eval:  Modified Oswestry 17/15 34%  05/03/2024:  Modified Oswestry Low Back Pain Disability Questionnaire: 17 / 50 = 34.0 % 06/22/2024:  Modified Oswestry Low Back Pain Disability Questionnaire: 14 / 50 = 28.0 %  COGNITION: Overall cognitive status: Within functional limits for tasks assessed     SENSATION: WFL   POSTURE: rounded shoulders  SKIN: Incision healing well; no warmth noted  LUMBAR ROM: NT due to post surgery   LOWER EXTREMITY ROM:   WFL   LOWER EXTREMITY MMT:  Grossly 4-/5   FUNCTIONAL TESTS:  Eval: 5 times sit to stand: 24.35 sec w. UE support Timed up and go (TUG): 27.10 sec with  str cane. Pt experienced some dizziness after completing turn  03/21/2024: Modified CTSIB:  Condition 1- 30 sec (but self corrected loss of balance at 20 sec), Condition 2- 5 sec, Condition 3- 30 sec (with increased sway), Condition 4- 2 sec.  Total- 67/120    03/23/2024 6 MWT with str cane:639ft- increased anterior Lt hip pain at 2 minute mark. Patient frequently touches walls when ambulating. She verbalized dizziness when people were walking around her.    04/12/2024: 5 times sit to stand: 11.77 sec with pain of 1/10 it's more of a throb, not sharp pain. Timed up and go (TUG): 12.69 sec without assistive device (with slight balance check with 180 degree turn)  04/19/2024: 5 times sit to stand: 10.99 sec Timed up and go (TUG): 10.14 sec with some slight self corrected balance checks Modified CTSIB:  Condition 1- 30 sec (very slight balance adjustments), Condition 2- 17 sec, Condition 3- 30 sec (with increased sway), Condition 4- 5 sec.  Total- 82/120  05/10/2024: 6 minute  walk test:  1005 ft with pain in bilateral hips pain 3/10 starting at 3.5 min mark.  Only had to touch wall once for balance. Modified CTSIB:  Condition 1- 30 sec, Condition 2- 15 sec, Condition 3- 30 sec (with increased sway), Condition 4-  20 sec.  Total- 95/120  GAIT: Distance walked: 34ft Assistive device utilized: Single point cane Level of assistance: Complete Independence Comments: decreased step length; patient experiences mild dizziness when turning  VESTIBULAR ASSESSMENT: 03/21/2024: Smooth pursuit:  nystagmus noted when looking to the right and up.  No nystagmus looking left Saccades:  WNL Gaze stabilization is WNL Head Impulse Test:  positive bilaterally, worse on right than left [place] negative bilaterally   TREATMENT DATE:  07/04/24 NuStep level 5 with LE only for 7 min with PT present to discuss status Weight shifts on airex (M/L and staggered stance) x 2 mins each-pt required UE support  first minute and intermittently throughout second minute when R foot first/up Rocker board (m/l and forwards/backwards) x 2 mins each using UE support  Seated transversus abdominus contraction with pushing ball into thighs 2x10 Seated hip adduction ball squeeze + TA activation 2x15 Seated LAQ 2 x 10 bilateral 2# AW only on left Tandem walking on airex beam (forwards & backwards then side stepping) x 3 laps each Standing heel raise on airex 2 x 15 Marching on BOSU (blue side up) 2x 15 Mini squats on BOSU (blue side up) x 15 Sit to stands 2x5 no UE support  Seated hamstring stretch 2x 20 sec   06/30/2024: No NuStep today due to patient getting an hip injection yesterday Weight shifts on airex (M/L and staggered stance) x 2 mins each Rocker board (m/l and forwards/backwards) x 2 mins each no UE support Seated transversus abdominus contraction with pushing ball into thighs 2x10 Seated hip adduction ball squeeze + TA activation x 20 Seated LAQ 2 x 10 bilateral 2# AW only on left Sit to stand 2 x 10 Tandem walking on airex beam (forwards & backwards then side stepping) x 3 laps each Standing heel raise on airex 2 x 10 Marching on BOSU (blue side up) x 20 Mini squats on BOSU (blue side up) x 10     06/27/2024: Nustep level 5 x7 min with PT present to discuss status Seated hamstring stretch 2x20 sec bilat Seated hip adduction ball squeeze with 5 sec hold 2x10 Seated transversus abdominus contraction with pushing ball into thighs 2x10 Supine hip abduction clamshells with yellow loop 2x10 Hooklying transversus abdominus contractions 2x10 Hooklying marching with yellow loop 2x10 Supine straight leg raise x8 bilat Supine short arc quad with 2# ankle weights 2x10 bilat Manual Therapy: Soft tissue mobilization to right hip and IT band region with increased soreness noted.  Patient with tenderness noted, so started to utilize the myofascial roller to allow for increased fascia mobility.  Noted  increased tenderness especially noted over right great trochanter area.  Also performed on left IT band region with less tenderness/pain noted.      PATIENT EDUCATION:  Education details: Spinal precautions; log roll; POC Person educated: Patient Education method: Explanation, Demonstration, and Handouts Education comprehension: verbalized understanding, returned demonstration, and needs further education  HOME EXERCISE PROGRAM: Access Code: [email or web address] URL: [email or web address] Date: 05/03/2024 Prepared by: Jarrell Menke  Exercises - Supine Transversus Abdominis Bracing - Hands on Stomach  - 1 x daily - 7 x weekly - 2 sets - 10 reps - 4 sec hold - Supine Diaphragmatic Breathing  -  1 x daily - 7 x weekly - 1 sets - 10 reps - Seated Long Arc Quad  - 1 x daily - 7 x weekly - 2 sets - 10 reps - 2 hold - Standing Heel Raise with Support  - 1 x daily - 7 x weekly - 2 sets - 10 reps - Seated Horizontal Smooth Pursuit  - 1 x daily - 7 x weekly - 2 sets - 10 reps - Seated Vertical Smooth Pursuit  - 1 x daily - 7 x weekly - 2 sets - 10 reps - Seated Proximal-Distal Smooth Pursuit  - 1 x daily - 7 x weekly - 2 sets - 10 reps - Seated Gaze Stabilization with Head Rotation  - 1 x daily - 7 x weekly - 2 sets - 30 sec hold - Seated Gaze Stabilization with Head Nod  - 1 x daily - 7 x weekly - 2 sets - 30 sec hold - Seated Scapular Retraction  - 1 x daily - 7 x weekly - 3 sets - 10 reps - Single Leg Balance with Clock Reach  - 1 x daily - 7 x weekly - 1-2 sets - 10 reps - Forward T with Counter Support  - 1 x daily - 7 x weekly - 1 sets - 10 reps - Forward Monster Walk with Resistance at Ankles and Counter Support  - 1 x daily - 7 x weekly - 2 sets - 10 reps - Side Stepping with Resistance at Ankles and Counter Support  - 1 x daily - 7 x weekly - 2 sets - 10 reps  ASSESSMENT:  CLINICAL IMPRESSION: Ms Mies presents to therapy with improved overall balance, decreased pain on her Rt  side since initial evaluation, and has met the majority of her PT goals. Pt demonstrates greater independence with performing her HEP and feels she is ready to be discharged after completion of next session. Today's focus was mastering LE balance without UE support, on even and uneven surfaces, challenging single limb proprioception. Pt demonstrated unsteadiness during some of the initial balance exercises, however became more stable by the end of session, able to perform mini squats without UE support on blue bosu (uneven surface). She is likely ready for discharge next session.      OBJECTIVE IMPAIRMENTS: Abnormal gait, decreased balance, decreased coordination, difficulty walking, decreased ROM, decreased strength, dizziness, increased muscle spasms, postural dysfunction, and pain.   ACTIVITY LIMITATIONS: carrying, lifting, bending, sitting, standing, squatting, transfers, and bed mobility  PARTICIPATION LIMITATIONS: cleaning, laundry, shopping, community activity, yard work, and church  PERSONAL FACTORS: Age, Fitness, and 1-2 comorbidities: Depression; osteoporosis are also affecting patient's functional outcome.   REHAB POTENTIAL: Good  CLINICAL DECISION MAKING: Evolving/moderate complexity  EVALUATION COMPLEXITY: Moderate   GOALS: Goals reviewed with patient? Yes  SHORT TERM GOALS: Target date: 04/17/2024  Patient will be independent with initial HEP. Baseline:  Goal status: Met on 03/27/24  2.  Patient will report > or = to 40% improvement in functional activity since starting PT. Baseline:  Goal status: Met on 03/27/24  3.  Patient to undergo vestibular assessment by vestibular physical therapist. Baseline:  Goal status: Met on 03/21/2024  4.  Patient will be able to perform STS with no UE support due to improved LE strength. Baseline:  Goal status: Met on 03/27/24   LONG TERM GOALS: Target date: 07/07/2024 Patient will demonstrate independence in advanced HEP to allow for  self progression after discharge. Baseline:  Goal status: Ongoing  2.  Patient will demonstrate improved walking tolerance and be able to grocery shop with minimal limitations due to her back pain. Baseline:  Goal status: Ongoing (see above for 6 min walk, still has pain starting at 3.5 min on 05/10/24)  3.  Patient will verbalize and demonstrate self-care strategies to manage pain including tissue mobility practices and change of position. Baseline:  Goal status: Ongoing  3.  Patient will reports > or = 70% improvement in her dizziness since starting PT. Baseline:  Goal status: Met on 04/12/24 (with patient reporting has improved 75% to her baseline)  4.  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  Baseline:  Goal status: Met on 04/21/24  5.  Patient will perform tug in < or = to 17 sec for decreased falls risk.  Baseline: 27.10sec Goal status: Partially Met (met for time, but one slight bobble with 180 degree turn on 04/12/24)    PLAN:  PT FREQUENCY: 1-2x/week  PT DURATION: 8 weeks  PLANNED INTERVENTIONS: 97164- PT Re-evaluation, 97110-Therapeutic exercises, 97530- Therapeutic activity, 97112- Neuromuscular re-education, 97535- Self Care, [id number]- Manual therapy, [phone]- Gait training, [phone]- Orthotic Fit/training, [phone]- Canalith repositioning, [id number]- Aquatic Therapy, [phone]- Electrical stimulation (unattended), 97016- Vasopneumatic device, [id number]- Ultrasound, [id number]- Ionotophoresis 4mg /ml Dexamethasone , Patient/Family education, Balance training, Stair training, Taping, Dry Needling, Joint mobilization, Joint manipulation, Vestibular training, Cryotherapy, and Moist heat.  PLAN FOR NEXT SESSION: Likely discharge visit; Perform 6 minute walk test    Lavanda Cleverly, SPT 07/04/24 1:04 PM    I agree with the following treatment note after reviewing documentation. This session was performed under the supervision of a  licensed clinician.  Jarrell Laming, PT, DPT 07/04/24, 1:09 PM  [facility] [address], [address] [place], [place] [address] Phone # [phone] Fax [phone]

## 2024-07-06 ENCOUNTER — Encounter: Payer: Self-pay | Admitting: Physical Therapy

## 2024-07-06 ENCOUNTER — Ambulatory Visit: Admitting: Physical Therapy

## 2024-07-06 DIAGNOSIS — R2681 Unsteadiness on feet: Secondary | ICD-10-CM

## 2024-07-06 DIAGNOSIS — R42 Dizziness and giddiness: Secondary | ICD-10-CM

## 2024-07-06 DIAGNOSIS — M5416 Radiculopathy, lumbar region: Secondary | ICD-10-CM

## 2024-07-06 DIAGNOSIS — M6281 Muscle weakness (generalized): Secondary | ICD-10-CM

## 2024-07-06 DIAGNOSIS — R2689 Other abnormalities of gait and mobility: Secondary | ICD-10-CM

## 2024-07-06 NOTE — Therapy (Signed)
 OUTPATIENT PHYSICAL THERAPY TREATMENT NOTE / DISCHARGE NOTE     Patient Name: GAYL IVANOFF MRN: [id number] DOB:1946/08/04, 78 y.o., female Today's Date: 07/06/2024   END OF SESSION:  PT End of Session - 07/06/24 1015     Visit Number 28    Date for PT Re-Evaluation 07/07/24    Authorization Type HTA    Progress Note Due on Visit 36    PT Start Time 0933    PT Stop Time 1014    PT Time Calculation (min) 41 min    Activity Tolerance Patient tolerated treatment well    Behavior During Therapy WFL for tasks assessed/performed             Past Medical History:  Diagnosis Date   Allergy    Non-Allergic Rhinitis   Balance problem due to vestibular dysfunction    Bloating    CAD (coronary artery disease)    Carotid artery occlusion    Cataract    Chordae tendineae rupture (HCC)    Cyst of ovary, right    Depression    Diverticulosis    DJD (degenerative joint disease)    Eczema    Family history of adverse reaction to anesthesia    SISTER HEART STOPPED DURING [id number] UNKNOWN CAUSE -  SHE DIED 10 YRS LATER OF HEART ATTACK   GERD (gastroesophageal reflux disease)    ESOPHAGITIS PRESENCE NOT SPECIFIED   Granuloma annulare    History of hiatal hernia    History of recurrent UTI (urinary tract infection)    Hypercholesterolemia    Hyperlipidemia    Lower back pain    Meniere's disease    Migraine    Mitral valve regurgitation    MODERATE NORMAL, MILD AORTIC REGURGITATION, MILD TRICUSPID REGURGITATION, LVH, GRADE 1 DIASTOLIC DYSFUNCTION, ECHO 2018 SUGGESTS RUPTURED CHORDAE TENDINEAE ( DR. BLANCA)   MVP (mitral valve prolapse)    Myalgia    OA (osteoarthritis) of hip 02/12/2016   Osteoporosis    Other nonrheumatic mitral valve disorders    Psoriasis    Pupil dilated    DUE TO EYE DROPS FOR TX OF RT  EYE PROBLEM   Reactive hypoglycemia    Sensitive skin    Shingles    Vertigo    Vestibular neuronitis    Vitamin D deficiency    Past Surgical History:   Procedure Laterality Date   EYE SURGERY Right 02-05-15   Cataract   EYE SURGERY Left 02-21-15   Cataract   TONSILLECTOMY     TOTAL HIP ARTHROPLASTY Right 02/12/2016   Procedure: RIGHT TOTAL HIP ARTHROPLASTY ANTERIOR APPROACH;  Surgeon: Dempsey Moan, MD;  Location: [facility];  Service: Orthopedics;  Laterality: Right;   Patient Active Problem List   Diagnosis Date Noted   S/P lumbar fusion 02/18/2024   Mitral valve regurgitation 08/14/2021   Coronary artery calcification 08/14/2021   Aortic atherosclerosis (HCC) 08/14/2021   Hyperlipidemia 08/14/2021   Carotid artery disease (HCC) 08/14/2021   OA (osteoarthritis) of hip 02/12/2016    PCP: Cleotilde Been, MD  REFERRING PROVIDER: Joshua Alm Hamilton, MD  REFERRING DIAG: M54.16 (ICD-10-CM) - Radiculopathy, lumbar region  Rationale for Evaluation and Treatment: Rehabilitation  THERAPY DIAG:  Radiculopathy, lumbar region  Muscle weakness (generalized)  Unsteadiness on feet  Other abnormalities of gait and mobility  Dizziness and giddiness  ONSET DATE: Surgery Date 02/18/2024  SUBJECTIVE:  SUBJECTIVE STATEMENT: Patient reports she is ready to discharge. Her back is okay. She is having increased right shoulder blade and hip pain.  PERTINENT HISTORY:  Depression; osteoporosis; vertigo; Meniere's disease; hx Rt total hip arthroplasty ; scoliosis   PAIN:  Are you having pain? Yes: NPRS scale: currently 5/ 10  Pain location: right side of body Pain description: throbbing; burning; sore Aggravating factors: continuous movement; reaching in cabinets; walking in grocery store  Relieving factors: laying down  PRECAUTIONS: Back lumbar fusion 3/7/225. Lifting < 10 lbs  RED FLAGS: None   WEIGHT BEARING RESTRICTIONS: No  FALLS:  Has patient fallen in  last 6 months? No  LIVING ENVIRONMENT: Lives with: lives alone Lives in: House/apartment Stairs: Yes: Internal: 1  steps; none Has following equipment at home: Single point cane  OCCUPATION: Retired  PLOF: Independent, Independent with basic ADLs, Independent with household mobility without device, and Independent with gait  PATIENT GOALS: Return to activities she does at her church; Return to [employer]; shop without issues;   NEXT MD VISIT: July 2025 with Dr Joshua  OBJECTIVE:  Note: Objective measures were completed at Evaluation unless otherwise noted.  DIAGNOSTIC FINDINGS:  12/29/2023 Lumbar CT without contrast  IMPRESSION: 1. Transitional lumbosacral anatomy. Designating normal lumbar segmentation results in hypoplastic ribs at L1. Correlation with radiographs is recommended prior to any operative intervention. 2. Levoconvex lumbar scoliosis with degenerative spondylolisthesis at L2-L3, L5-S1. Advanced endplate degeneration at the former, facet arthropathy at the latter. 3. No convincing spinal stenosis by CT. But moderate right lateral recess and severe right neural foraminal stenosis at L2-L3. Mild left lateral recess stenosis at L3-L4. 4.  Aortic Atherosclerosis (ICD10-I70.0).   PATIENT SURVEYS:  Eval:  Modified Oswestry 17/15 34%  05/03/2024:  Modified Oswestry Low Back Pain Disability Questionnaire: 17 / 50 = 34.0 % 06/22/2024:  Modified Oswestry Low Back Pain Disability Questionnaire: 14 / 50 = 28.0 %  COGNITION: Overall cognitive status: Within functional limits for tasks assessed     SENSATION: WFL   POSTURE: rounded shoulders  SKIN: Incision healing well; no warmth noted  LUMBAR ROM: NT due to post surgery   LOWER EXTREMITY ROM:   WFL   LOWER EXTREMITY MMT:  Grossly 4-/5   FUNCTIONAL TESTS:  Eval: 5 times sit to stand: 24.35 sec w. UE support Timed up and go (TUG): 27.10 sec with str cane. Pt experienced some dizziness after  completing turn  03/21/2024: Modified CTSIB:  Condition 1- 30 sec (but self corrected loss of balance at 20 sec), Condition 2- 5 sec, Condition 3- 30 sec (with increased sway), Condition 4- 2 sec.  Total- 67/120    03/23/2024 6 MWT with str cane:668ft- increased anterior Lt hip pain at 2 minute mark. Patient frequently touches walls when ambulating. She verbalized dizziness when people were walking around her.    04/12/2024: 5 times sit to stand: 11.77 sec with pain of 1/10 it's more of a throb, not sharp pain. Timed up and go (TUG): 12.69 sec without assistive device (with slight balance check with 180 degree turn)  04/19/2024: 5 times sit to stand: 10.99 sec Timed up and go (TUG): 10.14 sec with some slight self corrected balance checks Modified CTSIB:  Condition 1- 30 sec (very slight balance adjustments), Condition 2- 17 sec, Condition 3- 30 sec (with increased sway), Condition 4- 5 sec.  Total- 82/120  05/10/2024: 6 minute walk test:  1005 ft with pain in bilateral hips pain 3/10 starting at 3.5  min mark.  Only had to touch wall once for balance. Modified CTSIB:  Condition 1- 30 sec, Condition 2- 15 sec, Condition 3- 30 sec (with increased sway), Condition 4-  20 sec.  Total- 95/120  GAIT: Distance walked: 62ft Assistive device utilized: Single point cane Level of assistance: Complete Independence Comments: decreased step length; patient experiences mild dizziness when turning  VESTIBULAR ASSESSMENT: 03/21/2024: Smooth pursuit:  nystagmus noted when looking to the right and up.  No nystagmus looking left Saccades:  WNL Gaze stabilization is WNL Head Impulse Test:  positive bilaterally, worse on right than left [place] negative bilaterally   TREATMENT DATE:  07/06/24 NuStep level 5 with LE only for 5 min with PT present to discuss status 3.11 min walk 457 ft Manual using therapy roller to left quad and TFL Marching on BOSU (blue side up) 2x 15 Mini squats on BOSU only  able to do 3 then patient had increased pain Single leg balance on airex x 20 sec bilateral (required frequent UE support) Heel raises on airex x 20 Weight shifts on airex (M/L and staggered stance) x 2 mins each-    07/04/24 NuStep level 5 with LE only for 7 min with PT present to discuss status Weight shifts on airex (M/L and staggered stance) x 2 mins each-pt required UE support first minute and intermittently throughout second minute when R foot first/up Rocker board (m/l and forwards/backwards) x 2 mins each using UE support  Seated transversus abdominus contraction with pushing ball into thighs 2x10 Seated hip adduction ball squeeze + TA activation 2x15 Seated LAQ 2 x 10 bilateral 2# AW only on left Tandem walking on airex beam (forwards & backwards then side stepping) x 3 laps each Standing heel raise on airex 2 x 15 Marching on BOSU (blue side up) 2x 15 Mini squats on BOSU (blue side up) x 15 Sit to stands 2x5 no UE support  Seated hamstring stretch 2x 20 sec   06/30/2024: No NuStep today due to patient getting an hip injection yesterday Weight shifts on airex (M/L and staggered stance) x 2 mins each Rocker board (m/l and forwards/backwards) x 2 mins each no UE support Seated transversus abdominus contraction with pushing ball into thighs 2x10 Seated hip adduction ball squeeze + TA activation x 20 Seated LAQ 2 x 10 bilateral 2# AW only on left Sit to stand 2 x 10 Tandem walking on airex beam (forwards & backwards then side stepping) x 3 laps each Standing heel raise on airex 2 x 10 Marching on BOSU (blue side up) x 20 Mini squats on BOSU (blue side up) x 10     06/27/2024: Nustep level 5 x7 min with PT present to discuss status Seated hamstring stretch 2x20 sec bilat Seated hip adduction ball squeeze with 5 sec hold 2x10 Seated transversus abdominus contraction with pushing ball into thighs 2x10 Supine hip abduction clamshells with yellow loop 2x10 Hooklying  transversus abdominus contractions 2x10 Hooklying marching with yellow loop 2x10 Supine straight leg raise x8 bilat Supine short arc quad with 2# ankle weights 2x10 bilat Manual Therapy: Soft tissue mobilization to right hip and IT band region with increased soreness noted.  Patient with tenderness noted, so started to utilize the myofascial roller to allow for increased fascia mobility.  Noted increased tenderness especially noted over right great trochanter area.  Also performed on left IT band region with less tenderness/pain noted.      PATIENT EDUCATION:  Education details: Spinal precautions; log  roll; POC Person educated: Patient Education method: Explanation, Demonstration, and Handouts Education comprehension: verbalized understanding, returned demonstration, and needs further education  HOME EXERCISE PROGRAM: Access Code: [email or web address] URL: [email or web address] Date: 05/03/2024 Prepared by: Jarrell Menke  Exercises - Supine Transversus Abdominis Bracing - Hands on Stomach  - 1 x daily - 7 x weekly - 2 sets - 10 reps - 4 sec hold - Supine Diaphragmatic Breathing  - 1 x daily - 7 x weekly - 1 sets - 10 reps - Seated Long Arc Quad  - 1 x daily - 7 x weekly - 2 sets - 10 reps - 2 hold - Standing Heel Raise with Support  - 1 x daily - 7 x weekly - 2 sets - 10 reps - Seated Horizontal Smooth Pursuit  - 1 x daily - 7 x weekly - 2 sets - 10 reps - Seated Vertical Smooth Pursuit  - 1 x daily - 7 x weekly - 2 sets - 10 reps - Seated Proximal-Distal Smooth Pursuit  - 1 x daily - 7 x weekly - 2 sets - 10 reps - Seated Gaze Stabilization with Head Rotation  - 1 x daily - 7 x weekly - 2 sets - 30 sec hold - Seated Gaze Stabilization with Head Nod  - 1 x daily - 7 x weekly - 2 sets - 30 sec hold - Seated Scapular Retraction  - 1 x daily - 7 x weekly - 3 sets - 10 reps - Single Leg Balance with Clock Reach  - 1 x daily - 7 x weekly - 1-2 sets - 10 reps - Forward T with Counter  Support  - 1 x daily - 7 x weekly - 1 sets - 10 reps - Forward Monster Walk with Resistance at Ankles and Counter Support  - 1 x daily - 7 x weekly - 2 sets - 10 reps - Side Stepping with Resistance at Ankles and Counter Support  - 1 x daily - 7 x weekly - 2 sets - 10 reps  ASSESSMENT:  CLINICAL IMPRESSION: Discharge completed today. Ms. Zakrzewski had made great improvements with physical therapy. Recently she had a flare up of right hip bursitis and she recently got injection and that has helped her pain. Today during she verbalized her hips were locking up, so she was only able to complete 3 minutes.  She responded well to manual therapy techniques to her left quad and TFL. She has a follow up appointment with her provider on Tuesday to discuss MRI findings and plan for scapular and hip pain. Patient is independent with HEP at this time. Patient to discharge home with HEP.     OBJECTIVE IMPAIRMENTS: Abnormal gait, decreased balance, decreased coordination, difficulty walking, decreased ROM, decreased strength, dizziness, increased muscle spasms, postural dysfunction, and pain.   ACTIVITY LIMITATIONS: carrying, lifting, bending, sitting, standing, squatting, transfers, and bed mobility  PARTICIPATION LIMITATIONS: cleaning, laundry, shopping, community activity, yard work, and church  PERSONAL FACTORS: Age, Fitness, and 1-2 comorbidities: Depression; osteoporosis are also affecting patient's functional outcome.   REHAB POTENTIAL: Good  CLINICAL DECISION MAKING: Evolving/moderate complexity  EVALUATION COMPLEXITY: Moderate   GOALS: Goals reviewed with patient? Yes  SHORT TERM GOALS: Target date: 04/17/2024  Patient will be independent with initial HEP. Baseline:  Goal status: Met on 03/27/24  2.  Patient will report > or = to 40% improvement in functional activity since starting PT. Baseline:  Goal status: Met on 03/27/24  3.  Patient to undergo  vestibular assessment by  vestibular physical therapist. Baseline:  Goal status: Met on 03/21/2024  4.  Patient will be able to perform STS with no UE support due to improved LE strength. Baseline:  Goal status: Met on 03/27/24   LONG TERM GOALS: Target date: 07/07/2024 Patient will demonstrate independence in advanced HEP to allow for self progression after discharge. Baseline:  Goal status: MET 07/06/2024  2.  Patient will demonstrate improved walking tolerance and be able to grocery shop with minimal limitations due to her back pain. Baseline:  Goal status: Partially Met (due to flare up on hip pain) 07/06/2024  3.  Patient will verbalize and demonstrate self-care strategies to manage pain including tissue mobility practices and change of position. Baseline:  Goal status: MET 07/06/2024  3.  Patient will reports > or = 70% improvement in her dizziness since starting PT. Baseline:  Goal status: Met on 04/12/24 (with patient reporting has improved 75% to her baseline)  4.  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  Baseline:  Goal status: Met on 04/21/24  5.  Patient will perform tug in < or = to 17 sec for decreased falls risk.  Baseline: 27.10sec Goal status: Partially Met (met for time, but one slight bobble with 180 degree turn on 04/12/24)    PLAN:  PT FREQUENCY: 1-2x/week  PT DURATION: 8 weeks  PLANNED INTERVENTIONS: 97164- PT Re-evaluation, 97110-Therapeutic exercises, 97530- Therapeutic activity, 97112- Neuromuscular re-education, 97535- Self Care, [id number]- Manual therapy, [phone]- Gait training, [phone]- Orthotic Fit/training, [phone]- Canalith repositioning, [id number]- Aquatic Therapy, [phone]- Electrical stimulation (unattended), 97016- Vasopneumatic device, [id number]- Ultrasound, [id number]- Ionotophoresis 4mg /ml Dexamethasone , Patient/Family education, Balance training, Stair training, Taping, Dry Needling, Joint mobilization, Joint manipulation,  Vestibular training, Cryotherapy, and Moist heat.  PLAN FOR NEXT SESSION: patient to discharge home with HEP    Kristeen Sar, PT 07/06/24 10:16 AM [facility] [address], [address] [place], [place] [address] Phone # [phone] Fax [phone]  PHYSICAL THERAPY DISCHARGE SUMMARY  Visits from Start of Care: 28  Current functional level related to goals / functional outcomes: See above   Remaining deficits: Lingering hip and scapular pain   Education / Equipment: See above   Patient agrees to discharge. Patient goals were partially met. Patient is being discharged due to being pleased with the current functional level.

## 2024-07-11 DIAGNOSIS — M5416 Radiculopathy, lumbar region: Secondary | ICD-10-CM | POA: Diagnosis not present

## 2024-07-13 DIAGNOSIS — M810 Age-related osteoporosis without current pathological fracture: Secondary | ICD-10-CM | POA: Diagnosis not present

## 2024-07-13 DIAGNOSIS — E7800 Pure hypercholesterolemia, unspecified: Secondary | ICD-10-CM | POA: Diagnosis not present

## 2024-07-13 DIAGNOSIS — E785 Hyperlipidemia, unspecified: Secondary | ICD-10-CM | POA: Diagnosis not present

## 2024-07-19 DIAGNOSIS — I779 Disorder of arteries and arterioles, unspecified: Secondary | ICD-10-CM | POA: Diagnosis not present

## 2024-07-19 DIAGNOSIS — I340 Nonrheumatic mitral (valve) insufficiency: Secondary | ICD-10-CM | POA: Diagnosis not present

## 2024-07-19 DIAGNOSIS — N390 Urinary tract infection, site not specified: Secondary | ICD-10-CM | POA: Diagnosis not present

## 2024-07-19 DIAGNOSIS — N959 Unspecified menopausal and perimenopausal disorder: Secondary | ICD-10-CM | POA: Diagnosis not present

## 2024-07-19 DIAGNOSIS — Z1331 Encounter for screening for depression: Secondary | ICD-10-CM | POA: Diagnosis not present

## 2024-07-19 DIAGNOSIS — Z23 Encounter for immunization: Secondary | ICD-10-CM | POA: Diagnosis not present

## 2024-07-19 DIAGNOSIS — E7800 Pure hypercholesterolemia, unspecified: Secondary | ICD-10-CM | POA: Diagnosis not present

## 2024-07-19 DIAGNOSIS — H8103 Meniere's disease, bilateral: Secondary | ICD-10-CM | POA: Diagnosis not present

## 2024-07-19 DIAGNOSIS — Z0000 Encounter for general adult medical examination without abnormal findings: Secondary | ICD-10-CM | POA: Diagnosis not present

## 2024-07-19 DIAGNOSIS — I2510 Atherosclerotic heart disease of native coronary artery without angina pectoris: Secondary | ICD-10-CM | POA: Diagnosis not present

## 2024-07-19 DIAGNOSIS — M8580 Other specified disorders of bone density and structure, unspecified site: Secondary | ICD-10-CM | POA: Diagnosis not present

## 2024-07-20 ENCOUNTER — Other Ambulatory Visit: Payer: Self-pay | Admitting: Cardiology

## 2024-07-24 DIAGNOSIS — I2510 Atherosclerotic heart disease of native coronary artery without angina pectoris: Secondary | ICD-10-CM | POA: Diagnosis not present

## 2024-07-24 DIAGNOSIS — E7800 Pure hypercholesterolemia, unspecified: Secondary | ICD-10-CM | POA: Diagnosis not present

## 2024-07-28 ENCOUNTER — Other Ambulatory Visit (HOSPITAL_BASED_OUTPATIENT_CLINIC_OR_DEPARTMENT_OTHER): Payer: Self-pay | Admitting: Family Medicine

## 2024-07-28 DIAGNOSIS — M8580 Other specified disorders of bone density and structure, unspecified site: Secondary | ICD-10-CM

## 2024-08-09 ENCOUNTER — Ambulatory Visit (HOSPITAL_COMMUNITY)
Admission: RE | Admit: 2024-08-09 | Discharge: 2024-08-09 | Disposition: A | Discharge status: Home / Self Care | Source: Ambulatory Visit | Attending: Cardiology | Admitting: Cardiology

## 2024-08-09 DIAGNOSIS — I340 Nonrheumatic mitral (valve) insufficiency: Secondary | ICD-10-CM | POA: Diagnosis not present

## 2024-08-09 LAB — ECHOCARDIOGRAM COMPLETE
Area-P 1/2: 4.1 cm2
MV M vel: 5.91 m/s
MV Peak grad: 139.7 mmHg
P 1/2 time: 528 ms
Radius: 0.78 cm
S' Lateral: 3.6 cm

## 2024-08-11 ENCOUNTER — Telehealth: Payer: Self-pay | Admitting: Cardiology

## 2024-08-11 ENCOUNTER — Encounter (HOSPITAL_BASED_OUTPATIENT_CLINIC_OR_DEPARTMENT_OTHER): Payer: Self-pay

## 2024-08-11 NOTE — Telephone Encounter (Signed)
 Patient is scheduled to see Dr. Jeffrie on 08/15/24 and had labs completed at Dr. Dianne office. Patient would like us  to get the lab results for Dr. Jeffrie to review at her upcoming appointment. Please advise.

## 2024-08-11 NOTE — Telephone Encounter (Signed)
 Had labs at PCP on 07/24/2024.  Advised to contact PCP again to have them faxed.    She has an appt on Tuesday with Dr Jeffrie.

## 2024-08-13 DIAGNOSIS — E7800 Pure hypercholesterolemia, unspecified: Secondary | ICD-10-CM | POA: Diagnosis not present

## 2024-08-13 DIAGNOSIS — M810 Age-related osteoporosis without current pathological fracture: Secondary | ICD-10-CM | POA: Diagnosis not present

## 2024-08-13 DIAGNOSIS — E785 Hyperlipidemia, unspecified: Secondary | ICD-10-CM | POA: Diagnosis not present

## 2024-08-14 ENCOUNTER — Ambulatory Visit: Payer: Self-pay | Admitting: Cardiology

## 2024-08-15 ENCOUNTER — Encounter: Payer: Self-pay | Admitting: Cardiology

## 2024-08-15 ENCOUNTER — Ambulatory Visit: Attending: Cardiology | Admitting: Cardiology

## 2024-08-15 ENCOUNTER — Other Ambulatory Visit (HOSPITAL_COMMUNITY): Payer: Self-pay

## 2024-08-15 VITALS — BP 132/76 | HR 70 | Ht 61.0 in | Wt 103.8 lb

## 2024-08-15 DIAGNOSIS — I2510 Atherosclerotic heart disease of native coronary artery without angina pectoris: Secondary | ICD-10-CM

## 2024-08-15 DIAGNOSIS — E7800 Pure hypercholesterolemia, unspecified: Secondary | ICD-10-CM

## 2024-08-15 DIAGNOSIS — Z981 Arthrodesis status: Secondary | ICD-10-CM

## 2024-08-15 DIAGNOSIS — I340 Nonrheumatic mitral (valve) insufficiency: Secondary | ICD-10-CM | POA: Diagnosis not present

## 2024-08-15 DIAGNOSIS — I700 Atherosclerosis of aorta: Secondary | ICD-10-CM | POA: Diagnosis not present

## 2024-08-15 MED ORDER — ROSUVASTATIN CALCIUM 5 MG PO TABS
5.0000 mg | ORAL_TABLET | Freq: Every day | ORAL | 3 refills | Status: AC
  Filled 2024-08-15: qty 90, 90d supply, fill #0
  Filled 2024-11-10: qty 90, 90d supply, fill #1

## 2024-08-15 NOTE — Patient Instructions (Signed)
 Medication Instructions:  The current medical regimen is effective;  continue present plan and medications. A prescription for Crestor  5 mg daily has been sent into the pharmacy on the 1st floor.  *If you need a refill on your cardiac medications before your next appointment, please call your pharmacy*  Testing/Procedures: Your physician has requested that you have an echocardiogram in 1 year. Echocardiography is a painless test that uses sound waves to create images of your heart. It provides your doctor with information about the size and shape of your heart and how well your heart's chambers and valves are working. This procedure takes approximately one hour. There are no restrictions for this procedure. Please do NOT wear cologne, perfume, aftershave, or lotions (deodorant is allowed). Please arrive 15 minutes prior to your appointment time.  Please note: We ask at that you not bring children with you during ultrasound (echo/ vascular) testing. Due to room size and safety concerns, children are not allowed in the ultrasound rooms during exams. Our front office staff cannot provide observation of children in our lobby area while testing is being conducted. An adult accompanying a patient to their appointment will only be allowed in the ultrasound room at the discretion of the ultrasound technician under special circumstances. We apologize for any inconvenience.   Follow-Up: At [facility], you and your health needs are our priority.  As part of our continuing mission to provide you with exceptional heart care, our providers are all part of one team.  This team includes your primary Cardiologist (physician) and Advanced Practice Providers or APPs (Physician Assistants and Nurse Practitioners) who all work together to provide you with the care you need, when you need it.  Your next appointment:   1 year(s)  Provider:   Oneil Parchment, MD    We recommend signing up for the patient  portal called MyChart.  Sign up information is provided on this After Visit Summary.  MyChart is used to connect with patients for Virtual Visits (Telemedicine).  Patients are able to view lab/test results, encounter notes, upcoming appointments, etc.  Non-urgent messages can be sent to your provider as well.   To learn more about what you can do with MyChart, go to [email or web address].

## 2024-08-16 DIAGNOSIS — M5416 Radiculopathy, lumbar region: Secondary | ICD-10-CM | POA: Diagnosis not present

## 2024-08-16 DIAGNOSIS — M542 Cervicalgia: Secondary | ICD-10-CM | POA: Diagnosis not present

## 2024-08-16 DIAGNOSIS — M546 Pain in thoracic spine: Secondary | ICD-10-CM | POA: Diagnosis not present

## 2024-08-16 DIAGNOSIS — M419 Scoliosis, unspecified: Secondary | ICD-10-CM | POA: Diagnosis not present

## 2024-08-16 NOTE — Progress Notes (Signed)
 Cardiology Office Note:  .   Date:  08/16/2024  ID:  CORDELL COKE, DOB 20-Jun-1946, MRN [id number] PCP: Cleotilde Planas, MD  [facility] Providers Cardiologist:  Oneil Parchment, MD    History of Present Illness: .   Lori Frost is a 78 y.o. female Discussed the use of AI scribe software for clinical note transcription with the patient, who gave verbal consent to proceed.  History of Present Illness Lori Frost is a 78 year old female with severe mitral valve regurgitation who presents for cardiovascular follow-up.  She experiences persistent fatigue, which has been a recurring issue in previous visits. No shortness of breath, chest pain, or fainting episodes are reported. She occasionally experiences palpitations, described as her heart going 'whoop', but these are rare and long-standing.  She reports that her recent echocardiogram was performed, and she was told it showed severe mitral valve regurgitation, moderate mitral valve prolapse of the posterior leaflet, a normal left ventricular size, and an ejection fraction of 60-65%.  She underwent back surgery since her last visit and has scoliosis, causing pain from the middle of her back downwards, particularly on the right side. She describes pain in the middle of her back and tight muscles, especially around the shoulder blade area. She has not had physical therapy post-surgery and reports that her legs lock up, although this is not the focus of today's visit.  She has a history of Meniere's disease, contributing to constant dizziness and balance issues. Physical therapy usually involves both skeletal and vestibular components to address her balance and skeletal issues.  Her current medications include Zetia  10 mg daily, rosuvastatin  5 mg daily, and triamterene  hydrochlorothiazide  37.5/25 mg daily. She adjusted her rosuvastatin  dosage from 10 mg three times a week to 5 mg daily due to joint pain experienced at the higher dose. Her  LDL cholesterol was previously recorded at 92 mg/dL.      Studies Reviewed: SABRA   EKG Interpretation Date/Time:  Tuesday August 15 2024 09:24:10 EDT Ventricular Rate:  70 PR Interval:  158 QRS Duration:  84 QT Interval:  394 QTC Calculation: 425 R Axis:   -18  Text Interpretation: Normal sinus rhythm Poor R wave progression When compared with ECG of 11-Aug-2023 10:14, No significant change was found Confirmed by Parchment Oneil ([id number]) on 08/15/2024 9:46:59 AM    Results LABS Creatinine: 0.83  RADIOLOGY Coronary CT: Calcium  score 1946 percentile (2020)  DIAGNOSTIC Echocardiogram: Ejection fraction 60-65%, severe mitral valve regurgitation, moderate mitral valve prolapse of the posterior leaflet, LV size normal (08/09/2024) Risk Assessment/Calculations:            Physical Exam:   VS:  BP 132/76   Pulse 70   Ht 5' 1 (1.549 m)   Wt 103 lb 12.8 oz (47.1 kg)   SpO2 98%   BMI 19.61 kg/m    Wt Readings from Last 3 Encounters:  08/15/24 103 lb 12.8 oz (47.1 kg)  02/18/24 109 lb (49.4 kg)  02/09/24 108 lb (49 kg)    GEN: Well nourished, well developed in no acute distress NECK: No JVD; No carotid bruits CARDIAC: RRR, no murmurs, no rubs, no gallops RESPIRATORY:  Clear to auscultation without rales, wheezing or rhonchi  ABDOMEN: Soft, non-tender, non-distended EXTREMITIES:  No edema; No deformity   ASSESSMENT AND PLAN: .    Assessment and Plan Assessment & Plan Severe mitral valve regurgitation with moderate posterior leaflet mitral valve prolapse Severe mitral valve regurgitation with moderate posterior leaflet  mitral valve prolapse confirmed on recent echocardiogram. Ejection fraction is 60-65% with normal LV size. She is asymptomatic, with no shortness of breath or significant fatigue necessitating surgical intervention. - Monitor for symptoms such as shortness of breath, swelling, or decreased exercise tolerance. - Schedule follow-up echocardiogram in one year to  reassess cardiac function.  Hyperlipidemia Currently managed with Zetia  10 mg daily and rosuvastatin  5 mg daily. LDL is 92, with a target of less than 70. She has adjusted rosuvastatin  to 5 mg daily to improve LDL levels. She previously experienced joint pain after increasing the dose of rosuvastatin  to 10mg  daily last year, which led her to reduce the dose back to 5mg  three times a week.  - Prescribe rosuvastatin  5 mg daily. - Send prescription to the pharmacy located in the building for convenience.         Dispo: 1 yr  Signed, Oneil Parchment, MD

## 2024-09-12 DIAGNOSIS — M5416 Radiculopathy, lumbar region: Secondary | ICD-10-CM | POA: Diagnosis not present

## 2024-09-12 DIAGNOSIS — E7800 Pure hypercholesterolemia, unspecified: Secondary | ICD-10-CM | POA: Diagnosis not present

## 2024-09-12 DIAGNOSIS — M810 Age-related osteoporosis without current pathological fracture: Secondary | ICD-10-CM | POA: Diagnosis not present

## 2024-09-12 DIAGNOSIS — E785 Hyperlipidemia, unspecified: Secondary | ICD-10-CM | POA: Diagnosis not present

## 2024-09-14 DIAGNOSIS — Z23 Encounter for immunization: Secondary | ICD-10-CM | POA: Diagnosis not present

## 2024-09-18 DIAGNOSIS — H8109 Meniere's disease, unspecified ear: Secondary | ICD-10-CM | POA: Diagnosis not present

## 2024-10-10 ENCOUNTER — Other Ambulatory Visit: Payer: Self-pay | Admitting: Cardiology

## 2024-10-13 DIAGNOSIS — M810 Age-related osteoporosis without current pathological fracture: Secondary | ICD-10-CM | POA: Diagnosis not present

## 2024-10-13 DIAGNOSIS — E7800 Pure hypercholesterolemia, unspecified: Secondary | ICD-10-CM | POA: Diagnosis not present

## 2024-10-13 DIAGNOSIS — E785 Hyperlipidemia, unspecified: Secondary | ICD-10-CM | POA: Diagnosis not present

## 2024-11-10 ENCOUNTER — Other Ambulatory Visit (HOSPITAL_COMMUNITY): Payer: Self-pay

## 2024-12-04 ENCOUNTER — Inpatient Hospital Stay: Admission: RE | Admit: 2024-12-04 | Discharge: 2024-12-04 | Attending: Family Medicine | Admitting: Family Medicine

## 2024-12-04 DIAGNOSIS — Z1231 Encounter for screening mammogram for malignant neoplasm of breast: Secondary | ICD-10-CM

## 2025-01-10 ENCOUNTER — Other Ambulatory Visit (HOSPITAL_BASED_OUTPATIENT_CLINIC_OR_DEPARTMENT_OTHER)

## 2025-02-06 ENCOUNTER — Other Ambulatory Visit (HOSPITAL_BASED_OUTPATIENT_CLINIC_OR_DEPARTMENT_OTHER)
